# Patient Record
Sex: Male | Born: 1950 | Race: White | Hispanic: No | Marital: Married | Smoking: Never smoker
Health system: Southern US, Community
[De-identification: ages and names within clinical notes are randomized; demographics above are authoritative.]

## PROBLEM LIST (undated history)

## (undated) DIAGNOSIS — E119 Type 2 diabetes mellitus without complications: Secondary | ICD-10-CM

## (undated) DIAGNOSIS — I639 Cerebral infarction, unspecified: Secondary | ICD-10-CM

## (undated) DIAGNOSIS — G43909 Migraine, unspecified, not intractable, without status migrainosus: Secondary | ICD-10-CM

## (undated) HISTORY — DX: Cerebral infarction, unspecified: I63.9

## (undated) HISTORY — PX: BACK SURGERY: SHX140

---

## 1998-02-21 ENCOUNTER — Ambulatory Visit (HOSPITAL_COMMUNITY): Admission: RE | Admit: 1998-02-21 | Discharge: 1998-02-21 | Payer: Self-pay | Admitting: Internal Medicine

## 1999-06-11 ENCOUNTER — Other Ambulatory Visit: Admission: RE | Admit: 1999-06-11 | Discharge: 1999-06-11 | Payer: Self-pay | Admitting: Internal Medicine

## 2002-11-28 ENCOUNTER — Emergency Department (HOSPITAL_COMMUNITY): Admission: EM | Admit: 2002-11-28 | Discharge: 2002-11-29 | Payer: Self-pay | Admitting: Emergency Medicine

## 2003-02-08 ENCOUNTER — Ambulatory Visit (HOSPITAL_BASED_OUTPATIENT_CLINIC_OR_DEPARTMENT_OTHER): Admission: RE | Admit: 2003-02-08 | Discharge: 2003-02-08 | Payer: Self-pay | Admitting: Surgery

## 2006-02-01 ENCOUNTER — Ambulatory Visit (HOSPITAL_COMMUNITY): Admission: RE | Admit: 2006-02-01 | Discharge: 2006-02-01 | Payer: Self-pay | Admitting: Gastroenterology

## 2006-02-02 ENCOUNTER — Encounter: Admission: RE | Admit: 2006-02-02 | Discharge: 2006-02-02 | Payer: Self-pay | Admitting: Gastroenterology

## 2006-02-10 ENCOUNTER — Ambulatory Visit (HOSPITAL_COMMUNITY): Admission: RE | Admit: 2006-02-10 | Discharge: 2006-02-10 | Payer: Self-pay | Admitting: Surgery

## 2006-02-10 ENCOUNTER — Encounter (INDEPENDENT_AMBULATORY_CARE_PROVIDER_SITE_OTHER): Payer: Self-pay | Admitting: *Deleted

## 2006-04-08 ENCOUNTER — Encounter (INDEPENDENT_AMBULATORY_CARE_PROVIDER_SITE_OTHER): Payer: Self-pay | Admitting: *Deleted

## 2006-04-08 ENCOUNTER — Ambulatory Visit (HOSPITAL_COMMUNITY): Admission: RE | Admit: 2006-04-08 | Discharge: 2006-04-08 | Payer: Self-pay | Admitting: Gastroenterology

## 2008-11-12 ENCOUNTER — Encounter: Payer: Self-pay | Admitting: Emergency Medicine

## 2008-11-12 ENCOUNTER — Ambulatory Visit: Payer: Self-pay | Admitting: Diagnostic Radiology

## 2008-11-12 ENCOUNTER — Inpatient Hospital Stay (HOSPITAL_COMMUNITY): Admission: AD | Admit: 2008-11-12 | Discharge: 2008-11-16 | Payer: Self-pay | Admitting: Internal Medicine

## 2008-11-13 ENCOUNTER — Ambulatory Visit: Payer: Self-pay | Admitting: Vascular Surgery

## 2008-11-13 ENCOUNTER — Encounter (INDEPENDENT_AMBULATORY_CARE_PROVIDER_SITE_OTHER): Payer: Self-pay | Admitting: Internal Medicine

## 2008-11-14 ENCOUNTER — Encounter (INDEPENDENT_AMBULATORY_CARE_PROVIDER_SITE_OTHER): Payer: Self-pay | Admitting: Internal Medicine

## 2009-05-16 ENCOUNTER — Emergency Department (HOSPITAL_BASED_OUTPATIENT_CLINIC_OR_DEPARTMENT_OTHER): Admission: EM | Admit: 2009-05-16 | Discharge: 2009-05-16 | Payer: Self-pay | Admitting: Emergency Medicine

## 2009-05-16 ENCOUNTER — Ambulatory Visit: Payer: Self-pay | Admitting: Diagnostic Radiology

## 2010-09-27 ENCOUNTER — Encounter: Payer: Self-pay | Admitting: Gastroenterology

## 2010-11-11 ENCOUNTER — Emergency Department (HOSPITAL_BASED_OUTPATIENT_CLINIC_OR_DEPARTMENT_OTHER)
Admission: EM | Admit: 2010-11-11 | Discharge: 2010-11-12 | Disposition: A | Discharge status: Home / Self Care | Payer: PRIVATE HEALTH INSURANCE | Attending: Emergency Medicine | Admitting: Emergency Medicine

## 2010-11-11 DIAGNOSIS — T783XXA Angioneurotic edema, initial encounter: Secondary | ICD-10-CM | POA: Insufficient documentation

## 2010-11-11 DIAGNOSIS — E785 Hyperlipidemia, unspecified: Secondary | ICD-10-CM | POA: Insufficient documentation

## 2010-11-11 DIAGNOSIS — X58XXXA Exposure to other specified factors, initial encounter: Secondary | ICD-10-CM | POA: Insufficient documentation

## 2010-11-11 DIAGNOSIS — E119 Type 2 diabetes mellitus without complications: Secondary | ICD-10-CM | POA: Insufficient documentation

## 2010-11-11 DIAGNOSIS — R220 Localized swelling, mass and lump, head: Secondary | ICD-10-CM | POA: Insufficient documentation

## 2010-12-11 LAB — BASIC METABOLIC PANEL
CO2: 33 mEq/L — ABNORMAL HIGH (ref 19–32)
Chloride: 99 mEq/L (ref 96–112)
Creatinine, Ser: 1 mg/dL (ref 0.4–1.5)
GFR calc non Af Amer: >60 mL/min (ref >60)
Glucose, Bld: 133 mg/dL — ABNORMAL HIGH (ref 70–99)

## 2010-12-11 LAB — DIFFERENTIAL
Basophils Absolute: 0.1 10*3/uL (ref 0.0–0.1)
Basophils Relative: 1 % (ref 0–1)
Eosinophils Absolute: 0.4 10*3/uL (ref 0.0–0.7)
Lymphocytes Relative: 19 % (ref 12–46)
Lymphs Abs: 1.5 10*3/uL (ref 0.7–4.0)

## 2010-12-11 LAB — CBC: RDW: 12.1 % (ref 11.5–15.5)

## 2010-12-17 LAB — HEPATIC FUNCTION PANEL
ALT: 17 U/L (ref 0–53)
Albumin: 3.5 g/dL (ref 3.5–5.2)
Alkaline Phosphatase: 50 U/L (ref 39–117)
Indirect Bilirubin: 0.6 mg/dL (ref 0.3–0.9)
Total Protein: 6.5 g/dL (ref 6.0–8.3)

## 2010-12-17 LAB — CBC
HCT: 42.1 % (ref 39.0–52.0)
Hemoglobin: 14 g/dL (ref 13.0–17.0)
MCHC: 33.2 g/dL (ref 30.0–36.0)
MCV: 91.3 fL (ref 78.0–100.0)
Platelets: 261 10*3/uL (ref 150–400)
RBC: 3.85 MIL/uL — ABNORMAL LOW (ref 4.22–5.81)
RDW: 11.4 % — ABNORMAL LOW (ref 11.5–15.5)
WBC: 8.1 10*3/uL (ref 4.0–10.5)

## 2010-12-17 LAB — BASIC METABOLIC PANEL
BUN: 13 mg/dL (ref 6–23)
CO2: 22 mEq/L (ref 19–32)
Calcium: 9.8 mg/dL (ref 8.4–10.5)
Glucose, Bld: 151 mg/dL — ABNORMAL HIGH (ref 70–99)
Potassium: 4 mEq/L (ref 3.5–5.1)
Sodium: 140 mEq/L (ref 135–145)

## 2010-12-17 LAB — LIPID PANEL
Cholesterol: 125 mg/dL (ref 0–200)
HDL: 33 mg/dL — ABNORMAL LOW (ref >39)
LDL Cholesterol: 69 mg/dL (ref 0–99)
Total CHOL/HDL Ratio: 3.8 RATIO
Triglycerides: 113 mg/dL (ref <150)
VLDL: 23 mg/dL (ref 0–40)

## 2010-12-17 LAB — GLUCOSE, CAPILLARY
Glucose-Capillary: 110 mg/dL — ABNORMAL HIGH (ref 70–99)
Glucose-Capillary: 113 mg/dL — ABNORMAL HIGH (ref 70–99)
Glucose-Capillary: 121 mg/dL — ABNORMAL HIGH (ref 70–99)
Glucose-Capillary: 127 mg/dL — ABNORMAL HIGH (ref 70–99)
Glucose-Capillary: 128 mg/dL — ABNORMAL HIGH (ref 70–99)
Glucose-Capillary: 135 mg/dL — ABNORMAL HIGH (ref 70–99)
Glucose-Capillary: 170 mg/dL — ABNORMAL HIGH (ref 70–99)
Glucose-Capillary: 174 mg/dL — ABNORMAL HIGH (ref 70–99)
Glucose-Capillary: 186 mg/dL — ABNORMAL HIGH (ref 70–99)
Glucose-Capillary: 192 mg/dL — ABNORMAL HIGH (ref 70–99)
Glucose-Capillary: 88 mg/dL (ref 70–99)

## 2010-12-17 LAB — URINE CULTURE
Colony Count: NO GROWTH
Culture: NO GROWTH

## 2010-12-17 LAB — URINALYSIS, ROUTINE W REFLEX MICROSCOPIC
Bilirubin Urine: NEGATIVE
Hgb urine dipstick: NEGATIVE
Nitrite: NEGATIVE
Protein, ur: NEGATIVE mg/dL
Specific Gravity, Urine: 1.03 (ref 1.005–1.030)
Urobilinogen, UA: 0.2 mg/dL (ref 0.0–1.0)

## 2010-12-17 LAB — COMPREHENSIVE METABOLIC PANEL
ALT: 17 U/L (ref 0–53)
AST: 16 U/L (ref 0–37)
Albumin: 3.3 g/dL — ABNORMAL LOW (ref 3.5–5.2)
Alkaline Phosphatase: 44 U/L (ref 39–117)
CO2: 24 mEq/L (ref 19–32)
Chloride: 107 mEq/L (ref 96–112)
GFR calc Af Amer: >60 mL/min (ref >60)
GFR calc non Af Amer: >60 mL/min (ref >60)
Potassium: 4.6 mEq/L (ref 3.5–5.1)
Total Bilirubin: 0.6 mg/dL (ref 0.3–1.2)

## 2010-12-17 LAB — DIFFERENTIAL
Basophils Absolute: 0 10*3/uL (ref 0.0–0.1)
Basophils Relative: 0 % (ref 0–1)
Eosinophils Relative: 2 % (ref 0–5)
Monocytes Absolute: 0.4 10*3/uL (ref 0.1–1.0)

## 2010-12-17 LAB — VITAMIN B12: Vitamin B-12: 251 pg/mL (ref 211–911)

## 2010-12-17 LAB — CARDIAC PANEL(CRET KIN+CKTOT+MB+TROPI)
CK, MB: 0.8 ng/mL (ref 0.3–4.0)
Relative Index: INVALID (ref 0.0–2.5)
Relative Index: INVALID (ref 0.0–2.5)
Total CK: 40 U/L (ref 7–232)
Troponin I: <0.01 ng/mL (ref 0.00–0.06)
Troponin I: <0.01 ng/mL (ref 0.00–0.06)

## 2010-12-17 LAB — URINE MICROSCOPIC-ADD ON

## 2010-12-17 LAB — PROTIME-INR
INR: 1 (ref 0.00–1.49)
Prothrombin Time: 13.7 seconds (ref 11.6–15.2)

## 2010-12-17 LAB — HEMOGLOBIN A1C
Hgb A1c MFr Bld: 6.2 % — ABNORMAL HIGH (ref 4.6–6.1)
Mean Plasma Glucose: 131 mg/dL

## 2010-12-17 LAB — LIPASE, BLOOD: Lipase: 36 U/L (ref 11–59)

## 2010-12-17 LAB — B. BURGDORFI ANTIBODIES: B burgdorferi Ab IgG+IgM: 0.18 {ISR}

## 2010-12-17 LAB — APTT: aPTT: 30 seconds (ref 24–37)

## 2011-01-19 NOTE — Consult Note (Signed)
NAME:  [name], [name] NO.:  [id number]   MEDICAL RECORD NO.:  [id number]          PATIENT TYPE:  INP   LOCATION:  4730                         FACILITY:  [facility]   PHYSICIAN:  Melvyn Novas, M.D.  DATE OF BIRTH:  [birth year]   DATE OF CONSULTATION:  DATE OF DISCHARGE:                                 CONSULTATION   CHIEF COMPLAINT:  Blurred vision.   HISTORY OF PRESENT ILLNESS:  This is a pleasant 60 year old white male  admitted on November 12, 2008, for left-sided headache, left eye blurred  vision, left facial numbness and left extremity weakness.  Symptoms started approximately about a weak and half ago, became  progressively worse.  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    He noticed that when he was looking down at his paperwork, the words  looked very blurry to him.  He also noted when he looked across the  room , the lines that were across the wall did not seem to meet -  describes changes as if the left  vision field  seemed to be above  the  right portion, diplopia worsened with gaze to the left. he stated after  questioning him, that he felt he could no longer appreciate the colour  red , it became brown and dull.  .  As the day progressed, he noticed that his vision did not improve,  but he did note that if he closed his right eye, he had blurred vision,  no longer diplopia.  If he closed his left eye and opened his right eye,  the visual field remained clear.  In fact he finished his day at work  closing his left eye.  At the same time, he also described headache that  was throbbing in nature on the left temporal region behind his eye, did  not migrate down to his neck, no rigor , no nausea or fever. Marland Kitchen  He does have a history of migraine headaches; however, he states this  episode does not feel like his usual migraine headaches as his usual  migraine headaches cause numbness and tingling throughout his  left side  of his body and his left face.  He did not have any numbness, tingling  or paresthesia of his left face, nor did he have any on the left aspect  of his body.  The only similarity to his normal migraine headache is that he did have  scotoma, a flashing light out of his left eye.  His headache has dissipated to a mild headache over his left periorbital  region, yet his blurred vision did not improve (  nor has it progressed  at this time).  The patient had a full workup for CVA initiated by his hospitalist  physician, Dr. Meyer Russel, which includes a 2-D which showed left  ventricular systolic function to the normal, left ventricular ejection  fraction to be greater than or equal to 60%.  No ventricular wall abnormalities, however, he did have possible PFO and  recommendation was to acquire TEE.  Carotid  Doppler showed no ICA  stenosis.    MRI of the head showed no acute infarct, positive for white matter  changes bilaterally, some periventricular in nature, No enhancement of  the optic nerve or orbit.  Marland Kitchen  However, this was most likely due to small vessel disease.  MRA of the  head showed no evidence of stenosis.   PAST MEDICAL HISTORY:  Positive for hypertension, diabetes with  peripheral neuropathy, hypercholesterolemia, migraine headaches, GERD,  external and internal hemorrhoids and right colon polyps, diagnosed  March 2007 along with prostatitis.   MEDICATIONS:  1. Metformin 500 mg t.i.d.  2. Aciphex 20 mg daily.  3. NPH 14-16 units at bedtime.  4. Singulair 10 mg daily.  5. Lisinopril 20 mg daily.  6. Aspirin 81 mg daily.  7. Neurontin 200-300 mg at bedtime.   ALLERGIES:  CODEINE, which causes a rash.   SOCIAL HISTORY:  He does not smoke, drink or do illicit drugs.   REVIEW OF SYSTEMS:  Negative with the exception of above.   PHYSICAL EXAMINATION:  VITAL SIGNS:  The patient's blood pressure on  November 13, 2008, was 93/61; on November 14, 2008, was 94/58, and November 15, 2008, was 105/69; pulse 73, respiratory rate 18, and temperature 98.3.  PULMONARY:  Clear to auscultation bilaterally.  No rhonchi or wheezing.  CARDIOVASCULAR:  S1 and S2.  Regular rate and rhythm  NECK:  Negative for bruits and supple.  NEUROLOGIC:  Mental status to be alert and oriented x3.  Carrying out 2  and 3 step commands without any difficulty. Fluent speech and  comprehension. no apraxia.  Cranial nerves, pupils are equal, round, reactive, and accommodating to  light.  Conjugate gaze.  Extraocular muscles are intact.  Visual field  was decreased over when the patient covered his right eye as he had  blurred vision throughout, all peripheral fields and central fields;  however, when he closed his left eye, vision was within normal limits.  The patient had no skotomata.  The patient also admitted that the colour  red was less colorful in the vision of his left eye.  His face was symmetrical.  Tongue is midline.  Uvula midline.  Sensation in V1-V3 were full bilaterally.  Shoulder shrug head turn was  full.   Coordination, finger-to-nose was within normal limits with his right  hand, however, with his right hand as I moved my finger across to the  right aspect of his body, it was notable that the patient showed  past-  pointing and dysmetria , he missed the finger by 2-3 inches at arms  length on the right. .  Heel-to-shin was within normal limits and fine  finger movements were within normal limits.  Motor strength was 5/5 globally without tremor, no increased tone.    No abnormal muscle movements or fasciculations.  The patient's gait  was wide based and he did show truncal ataxia tending to veer to the  right.  Deep tendon reflexes were 2+ throughout with downgoing towards  bilaterally.  The patient did not have a drift to the upper and lower extremities  bilaterally.  Sensation was full to pinprick, light touch, and vibration throughout.  He had positive stereognosis  and graphesthesia bilaterally.   LABORATORY STUDIES:  UA was negative.  Sodium was 139, potassium 4.6,  chloride was 107, CO2 was 24, BUN 14, creatinine 0.96, glucose was 162.  White blood cell count 8.1, platelets 261, hemoglobin and hematocrit  12.3 and  35.2.  HbA1c was 6.2.  TSH within normal limits.  Sed rate was  within normal limits.   IMAGING:  As stated above.   ASSESSMENT AT THIS TIME:  This is a pleasant 60 year old male with  gradual onset of left ocular blurring of vision, left-sided paresthesia  and headache.  This could be a brainstem CVA but MRI was negative and there were no  bulbar signs.  Differential includes optic neuritis of ischaemic/ diabetic origin and /  or demyelinating disease. ; however, vasculitis cannot be rule out.     Dr. Vickey Huger recommended  obtaining ANCA/ANA, C-reactive protein, Lyme  disease titer , and protein C and S levels.  Due to his age and gender, he is not likely to have MS; however, if labs  show to be within normal limits, we would recommend LP for oligoclonal  bands.    After blood tests are taken, we will start the patient on prednisone  IV 500 mg with Solu-Medrol tomorrow for a period of 3 days, than oral  taper. .  We will continue that through Sunday and Monday with [facility].    We would like to see the patient follow up in the office with Dr.  Vickey Huger in 1 week with a VEP study to be done in the office and  possibly LP at [facility].  .  Steroids and the possible  complication of diabetes have been  discussed with this patient and he understands.  We will see the patient in 1-2 weeks, prescriptions were written.     ______________________________  Felicie Morn, PA-C      Melvyn Novas, M.D.  Electronically Signed    DS/MEDQ  D:  11/15/2008  T:  11/16/2008  Job:  [id number]   cc:   Melvyn Novas, M.D.

## 2011-01-19 NOTE — Discharge Summary (Signed)
NAMEMarland Kitchen  Sean, [name] NO.:  [id number]   MEDICAL RECORD NO.:  [id number]          PATIENT TYPE:  INP   LOCATION:  4730                         FACILITY:  [facility]   PHYSICIAN:  Charlestine Massed, MDDATE OF BIRTH:  Nov 22, 1950   DATE OF ADMISSION:  11/12/2008  DATE OF DISCHARGE:  11/16/2008                               DISCHARGE SUMMARY   PRIMARY CARE PHYSICIAN:  Silas Sacramento in [place], [place].   NEUROLOGY:  Melvyn Novas, M.D.   REASON FOR ADMISSION:  Left-sided headache with left blurry vision and  left facial numbness and left-sided weakness.   DISCHARGE DIAGNOSES:  1. Migraine headache history with recurrent flare.  2. Possible multiple sclerosis with decreased vision in left eye.  3. Diabetes mellitus type 2 controlled.  4. Dyslipidemia with a low HDL.  5. Gastroesophageal reflux disease.  6. History of external hemorrhoids.  7. Asthma.   DISCHARGE MEDICATIONS:  1. Solu-Medrol 500 mg IV daily for 2 more days (prednisone to be      started after solumedrol is stopped)  2. Prednisone prescription has been given by Dr. Vickey Huger to be      started on the day after Solu-Medrol is stopped.  It is a 10 mg      t.i.d. taper to 10 mg tablets 3 on day 1 and 2 on day 2 and 1 on      day 3 tapered to a stop on day 4. )  3. Niaspan ER 500 mg p.o. q.h.s.  4. Aspirin 325 mg p.o. q.h.s. to be taken together, with Niaspan and      aspirin to be taken together.  5. Lisinopril 10 mg p.o. daily.  6. NovoLog coverage as sensitive scale q.a.c. q.h.s.  7. The previous medications to be continued are metformin 500 mg      b.i.d.  8. NPH insulin 14 units q.h.s. at night.  9. Aciphex 20 mg daily.  10.Singulair 10 mg daily.  11.Gabapentin 200 mg q.h.s.   MEDICATIONS CHANGES:  1. Aspirin changed to 325 mg daily.  2. Lisinopril changed 20 mg to a dose of 10 mg p.o. daily now.   HOSPITAL COURSE:  1. Left-sided headache with blurry vision on the left side with      tingling and nausea.  The patient has prior history of migraine      headaches for many years before which have been investigated      before.  He comes in with complaints of left-sided headache with      some blurry vision in the left eye with tingling sensation of the      left side as well as numbness on the left side of the face.  The      patient stated that this is the usual way he gets migraines.      Initially an MRI and MRA were done and a stroke call was done but      there was no evidence of any stroke on the MRI and MRA.  The      patient was started on aspirin and given pain  medications      initially.  The next day the patient was evaluated by me.  I saw      that the patient had some residual blurry vision which the patient      states stays for 2-3 days.  I contacted ophthalmology that day and      the next day ophthalmology saw the patient and they found that      there is no evidence of any ophthalmologic issue in the eye and a      neurologic consult was requested.  The patient was seen by Dr.      Vickey Huger who thought that the patient has a skewed deviation of the      eye as well as loss of wet vision which she believes could be due      to central optic neuropathy versus multiple sclerosis.  The patient      was put up with a working diagnosis of multiple sclerosis with the      possibility of optic neuritis and started on Solu-Medrol after sets      of vasculitis labs were taken.  His vasculitis labs are still      pending.  He has been started on Solu-Medrol, received one dose      that and he will receive 2 more days of Solu-Medrol at home after      which he will have a prednisone taper for 3 days.  The patient is      expected to see Dr. Vickey Huger in her office on March 17.  Currently      the patient's pain has come down.  He still has some truncal      ataxia.  The patient has been advised to use a cane if needed to      walk to avoid falls and has been  advised clearly not to walk in a      room which has decreased lighting and to follow fall precautions      very carefully.  The patient exhibited understanding and is being      discharged today with [facility] for IV Solu-Medrol      for the next 2 days.  2. Diabetes.  The patient has been explained that his diabetes      mellitus is currently stable until today and he has been explained      that the blood sugars can go up due to Solu-Medrol on board and      prednisone.  He will be started on sliding scale with NovoLog at      home and continue at home with sliding scale.  He will continue      with metformin and Novolin as before.  3. History of dyslipidemia.  He has a low HDL, otherwise the LDL is      only 69 which does not need any statin therapy for now but his low      HDL being the issue would consider him at high risk for metabolic      syndrome in view of his existing diabetes.  So I started him on, he      is already on metformin, to continue the metformin.  We will start      him on Niaspan ER at 500 mg p.o. q.h.s. which should also be taken      along with the aspirin he has been prescribed for his possible  ischemic disease of the brain.  4. Gastroesophageal reflux disease.  He has history of GERD and we      will continue with Aciphex.  5. Questionable hypertension.  He was on 20 mg of lisinopril but his      blood pressures were very low so it has been changed to 10 mg of      lisinopril.  This needs to be reevaluated.  If the blood pressure      still stays low then the patient needs to be changed to 5 mg of      lisinopril.  6. Asthma.  Continue Singulair.  No acute issues now.   FOLLOWUP:  1. To follow up with the primary care physician in 3 weeks.  2. Follow up with Dr. Vickey Huger on March 17.  Phone number has been      given to the patient.   DISCHARGE INSTRUCTIONS:  1. Follow fall precautions clearly.  Use a cane if necessary to avoid       falls.  Not to walk in a room with less      lighting.  The patient has exhibited understanding of all these      things.  2. Keep MD followups as instructed.  The patient has exhibited      understanding about the disease process and he has exhibited full      understanding of followups and medication adherence.      Charlestine Massed, MD  Electronically Signed     UT/MEDQ  D:  11/16/2008  T:  11/16/2008  Job:  [id number]   cc:   Melvyn Novas, M.D.  Silas Sacramento

## 2011-01-19 NOTE — H&P (Signed)
NAME:  [name], [name] NO.:  [id number]   MEDICAL RECORD NO.:  [id number]          PATIENT TYPE:  INP   LOCATION:  4703                         FACILITY:  [facility]   PHYSICIAN:  Elliot Cousin, M.D.    DATE OF BIRTH:  03-Oct-1950   DATE OF ADMISSION:  11/12/2008  DATE OF DISCHARGE:                              HISTORY & PHYSICAL   PRIMARY CARE PHYSICIAN:  Dr. Silas Sacramento in [place], [place].  (The patient is unassigned to [facility]).   CHIEF COMPLAINT:  Left-sided headache, left eye blurred vision, left  facial numbness, left eye light sensitivity, and left-sided weakness.   HISTORY OF PRESENT ILLNESS:  The patient is a 60 year old man with a  past medical history significant for migraine headaches, type 2 diabetes  mellitus, and dyslipidemia.  He presented to the emergency department  this morning with a chief complaint of blurred vision associated with a  left-sided headache, left facial numbness, and left eye light  sensitivity.  His symptoms started approximately 1 week ago.  They have  become progressively worse.  He actually presented to his primary care  physician when his symptoms started.  She evaluated the patient and she  felt that he may have had an inner ear infection associated with a  migraine headache.  She prescribed amoxicillin.  He has been taking  amoxicillin every day including today which had been the last dose.  His  symptoms did not abate.  He does have some ringing in his ears, however,  he denies ear pain.  His headache is mostly left-sided and it has been  constant for the past 2 days.  It has been a throbbing pain.  It has  been an 8/10 in intensity.  He has had blurred vision in both eyes but  primarily in the left eye.  He denies double vision, however he does see  white and yellow spots.  He also feels that his depth perception is off.  He has had intermittent dizziness which is a spinning dizziness and  sometimes he feels as  if he is just simply off balance.  The dizziness  generally occurs when he stands up.  He has had chronic numbness in his  fingers and feet.  However, he developed some numbness on the left side  of his face a few days ago.  He has had no difficulty swallowing.  His  wife says that occasionally he does demonstrate slurred speech.  The  patient also complains of occasional palpitations but no outright chest  pain or shortness of breath.  He has had nausea and vomiting on two or  three reoccurrences over the past week.  He denies associated black  tarry stools, red blood in his stools, and coffee grounds emesis.  He  has also developed diarrhea over the past 4 or 5 days.  He he has been  having loose to watery stools numbering anywhere from 5-15 per day.  The  diarrhea did not start until after the amoxicillin was initiated.  He  denies subjective fever and chills and a stiff neck.   Of  note, the patient does have migraine headaches and the symptoms  associated his migraine usually consist of partial vision in the left  eye, unusual visual hallucinations, jaw biting, decrease in sensation in  his left hand, and a left-sided headache.  Of note, the patient says  that he does feel weaker on his left side, particularly his left leg  which is worse than usual and worse than when he suffers from a migraine  attack.   During the evaluation in the emergency department, the patient was noted  to be hemodynamically stable.  He was afebrile.  His EKG in the  emergency department revealed a heart rate of 88 beats per minute and no  acute abnormalities.  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  His electrolytes  were unremarkable.  His sed rate was marginally elevated at 18.  He is  being admitted for  further evaluation and management.Marland Kitchen   PAST MEDICAL HISTORY:  1. 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  2. Dyslipidemia.  3. Questionable history of hypertension.  The patient says that he is      being treated with lisinopril for kidney protection.  4. Gastroesophageal reflux disease.  5. Migraine headaches.  6. Small external and internal hemorrhoids, sigmoid diverticula, and      proximal right colon polyps per colonoscopy in March 2007 by Dr.      Loreta Ave  7. Biliary dyskinesia status post laparoscopic cholecystectomy in June      2007 by Dr. Corliss Skains.  8. Status post umbilical hernia repair with mesh by Dr. Jamey Ripa in June      2004.  9. History of prostatitis.  10.Asthma.   MEDICATIONS:  1. Metformin 500 mg b.i.d.  2. Aciphex 20 mg daily.  3. Insulin NPH (beef or pork)  14 to 16 units q.h.s.  4. Singulair 10 mg  daily.  5. Lisinopril 20 mg daily.  6. Aspirin 81 mg daily.  7. Gabapentin  200 or 300 mg q.h.s.   ALLERGIES:  The patient has an allergy to CODEINE WHICH CAUSES ITCHING.   SOCIAL HISTORY:  The patient is married.  [place].  He has two sons.  He is employed as a Electrical engineer.  He denies tobacco, alcohol and illicit drug use.   FAMILY HISTORY:  His father died of stomach cancer at 80 years of age.  His mother died of esophageal cancer at 58 years of age.   REVIEW OF SYSTEMS:  The patient's review of systems is indicated above  in the history of present illness.  In addition, the patient has had  some difficulty urinating, urinary hesitancy, and a weak urine stream.  He denies painful urination.   PHYSICAL EXAMINATION:  VITAL SIGNS:  Temperature afebrile.  Pulse 81,  respiratory rate 20, oxygen saturation 92% on room air blood, blood  pressure 102/68, capillary blood glucose 110.  GENERAL:  The patient is a pleasant 60 year old Caucasian man Caucasian man who is  currently sitting up in bed in no acute distress.   HEENT: Head is normocephalic nontraumatic. Bilateral temporal arteries  palpable.  Pupils equal, round, reactive to light. Mild photophobia on  the left.  Extraocular muscles are intact.  Conjunctivae are clear.  Sclerae are white.  Tympanic membranes are clear  bilaterally.  No  evidence of edema, erythema or drainage.  Nasal mucosa is mildly dry.  No sinus tenderness.  Oropharynx reveals good dentition.  Mucous  membranes are mildly dry.  No posterior exudates or erythema.  NECK:  Supple.  No adenopathy, no thyromegaly, no bruit or JVD.  LUNGS:  Clear to auscultation bilaterally.  HEART:  S1-S2 with a soft systolic murmur.  ABDOMEN:  Mildly obese, positive bowel sounds, soft, nontender,  nondistended.  No hepatosplenomegaly, no masses palpated.  GU / RECTAL:  Deferred.  EXTREMITIES:  Pedal pulses are palpable bilaterally.  No pretibial  edema.  No pedal edema.  NEUROLOGIC:  The patient is alert and oriented x3.  Cranial nerves II-  XII are intact except a mild decrease in cold sensation on the left .  No nystagmus detected.  Upon standing, the patient did become mildly  ataxic and off balance.  Romberg was negative.  Cerebellar with finger-  to-nose testing was intact bilaterally.  Left upper extremity strength  is approximately 5- over 5 and left lower extremity strength is  approximately 4+  to 5 minus over 5 in the supine position.  Strength on  the right including the right upper and right lower extremities is 5/5.  Gait:  The patient took a few steps with demonstration of mild ataxia  but no obvious foot drop or foot drag.   ADMISSION LABORATORIES:  EKG and CT scan of the head results are above.  Sed rate 18.  Sodium 140, potassium 4.0, chloride 101, CO2 22, BUN 13,  glucose 151, creatinine 9, calcium 9.8.  WBC 8.2, hemoglobin 14.0,  platelets 311.   ASSESSMENT:  1. Sign symptom complex including left-sided headache, visual changes,      mild left-sided hemiparesis, left facial  paresthesias, and mild      photophobia.  Noted on the CT scan of the patient's head are      findings consistent with extensive atherosclerotic disease.  It is      unclear whether or not the patient has experienced a transient      ischemic attack or a complex migraine headache or an acute stroke.      Given the extensiveness of the atherosclerotic disease, other      diagnoses may need to be entertained including a rheumatological      abnormality or multiple sclerosis.  2. Diarrhea.  More than likely, the diarrhea has been and is secondary      to amoxicillin therapy.  The patient did not experience diarrhea      until after he had been treated with amoxicillin for several days.      His white blood cell count is within normal limits and he is      afebrile.  His abdomen is benign on exam.  Although C-difficile      colitis is a possibility, the patient does not appear to be toxic      or dehydrated at this time.  3. Type 2 diabetes mellitus.  The patient's capillary blood glucose is      modestly elevated.  He is treated chronically with metformin and      NPH insulin.  4. Urinary symptoms.  A urinary tract infection will need to be ruled      out.  The patient does have a remote history of acute prostatitis.   PLAN:  1. The patient was transferred from the [facility] Emergency      Department for further  evaluation and management.  He has been      placed on telemetry.  2. For further evaluation will check an MRI of the brain, MRA of the      brain, and MRA of the neck.  We will also assess the patient      further with carotid Dopplers and a 2-D echocardiogram.  3. Will check cardiac enzymes, hemoglobin A1c, TSH, vitamin B12, and      fasting lipid panel.  4. Will check stool studies to rule out infectious diarrhea.  The      patient will be placed on contact precautions.  We will also check      his amylase, lipase and a urinalysis.  5. Will decrease the dose of lisinopril as  the patient's blood      pressure is on the lower side of normal.  We will also hold      metformin to avoid symptomatic hypoglycemia.  His diabetes will be      treated with a lower dose of NPH and sliding scale NovoLog.  6. As-needed oxycodone will be administered for pain.      Elliot Cousin, M.D.  Electronically Signed     DF/MEDQ  D:  11/12/2008  T:  11/12/2008  Job:  [id number]

## 2011-01-22 NOTE — Op Note (Signed)
NAME:  [name], [name]                        ACCOUNT NO.:  [id number]   MEDICAL RECORD NO.:  [id number]                   PATIENT TYPE:  AMB   LOCATION:  DSC                                  FACILITY:  [facility]   PHYSICIAN:  Currie Paris, M.D.           DATE OF BIRTH:  1950-12-13   DATE OF PROCEDURE:  02/08/2003  DATE OF DISCHARGE:                                 OPERATIVE REPORT   VISIT:  [id number].   OFFICE MEDICAL RECORD NUMBER:  [id number]   PREOPERATIVE DIAGNOSIS:  Umbilical hernia.   POSTOPERATIVE DIAGNOSIS:  Umbilical hernia.   OPERATION:  Repair of umbilical hernia with mesh.   SURGEON:  Currie Paris, M.D.   ANESTHESIA:  MAC.   CLINICAL HISTORY:  The patient is a 60 year old male has a small umbilical  hernia that really protrudes just above the umbilicus and was at least  partially reducible in the office.  He desired having this fixed.   DESCRIPTION OF PROCEDURE:  The patient was seen in the holding area.  He had  no further questions.  The hernia was identified preoperatively.  He was  taken to the operating room, and after satisfactory IV sedation, the  umbilical area was shaved, prepped, and draped.  A combination of 1%  Xylocaine plain and 0.5% Marcaine with epinephrine was mixed equally and  used for local.  I decided to make a vertical incision going actually from  the base of the umbilicus superiorly to over where the bulge was.  Upon  entering the subcutaneous tissue, I basically entered the sac at the same  time.  There was a small amount of omentum protruding out, and this was able  to be reduced.  Using the cautery, I freed up the fascia from the  subcutaneous tissue, and the defect was less than a cm.  There was no other  apparent defect noted.  I used a small piece of Marlex mesh cut into a  square and then fashioned into a cone shape to occlude the hole and closed  the defect with two sutures of 0 Prolene incorporating the mesh with  each  suture and using the mesh basically as a bolster for the sutures.  They tied  down easily.   The subcutaneous tissue was closed with 3-0 Vicryl, and the skin with 4-0  Monocryl subcuticular plus Dermabond.  I then used a mineral oil-soaked  cotton ball to try to keep some form of the umbilicus and a sterile  dressing.   The patient tolerated the procedure well.  There were no operative  complications, and all counts were correct.                                               Currie Paris, M.D.  CJS/MEDQ  D:  02/08/2003  T:  02/09/2003  Job:  [id number]   cc:   [facility]   Lina Sar, M.D. [facility]

## 2011-01-22 NOTE — Op Note (Signed)
NAME:  YOUSOF, [name] NO.:  [id number]   MEDICAL RECORD NO.:  [id number]          PATIENT TYPE:  AMB   LOCATION:  SDS                          FACILITY:  [facility]   PHYSICIAN:  [name], M.D. DATE OF BIRTH:  November 04, 1950   DATE OF PROCEDURE:  02/10/2006  DATE OF DISCHARGE:                                 OPERATIVE REPORT   PREOPERATIVE DIAGNOSIS:  Biliary dyskinesia.   POSTOPERATIVE DIAGNOSIS:  Biliary dyskinesia.   PROCEDURE PERFORMED:  Laparoscopic cholecystectomy with intraoperative  cholangiogram.   SURGEON:  [name], M.D.   ASSISTANT:  Ollen Gross. Carolynne Edouard, M.D.   ANESTHESIA:  General endotracheal.   INDICATIONS:  The patient is a 60 year old diabetic male who presented with  several months of epigastric right upper quadrant and right flank pain.  He  has had a thorough workup by gastroenterology.  This included a CT scan of  the abdomen and pelvis which was completely normal.  An EGD was then  performed on 05/25 which showed was completely normal through to the  duodenum.  Gallbladder ultrasound was unremarkable except for some mild  fatty infiltration of the liver.  HIDA scan was then performed which showed  a gallbladder ejection fraction low at 40%.  MRCP on 02/02/2006 showed no  biliary ductal dilatation, no choledocholithiasis.  The patient presented  for surgical evaluation and we recommended a laparoscopic cholecystectomy.   DESCRIPTION OF PROCEDURE:  The patient is brought to the operating room and  placed supine position on the operating table.  After an adequate level of  general endotracheal anesthesia was obtained, the patient's abdomen was  shaved, prepped with Betadine and draped in sterile fashion.  A time-out was  taken assure proper patient, proper procedure.  The patient has had a  previous umbilical hernia repair with mesh so we chose to go below the mesh.  A small vertical midline incision was made 2 cm below the umbilicus.   The  fascia was opened vertically and the peritoneal cavity was bluntly entered.  A stay suture of 0 Vicryl was placed around the fascial opening and the  Hasson cannula was inserted and secured with stay suture.  Pneumoperitoneum  was obtained by insufflating CO2 maintaining maximal pressure of 15 mmHg.  The patient was rotated in reverse Trendelenburg position rotated slightly  to his left.  The laparoscope was inserted and the interior surface of the  abdominal wall was examined.  No hernias were noted.  We were able to  visualize the previous mesh from the umbilical hernia repair.  Especially  the right upper quadrant there was no evidence of abdominal wall hernia.  There was some deformity to the costal margin.  The patient may have a  dislocated lower rib.   A 10 mm port was placed subxiphoid position.  Two 5 mm ports placed in right  upper quadrant.  The gallbladder was grasped with a clamp and elevated over  the edge of the liver.  The gallbladder did not appear to be inflamed.  There were some adhesions to the gallbladder.  Adhesions were taken down  with cautery and blunt dissection.  The wall of the gallbladder seemed to be  very brittle and tore  easily with normal grasping.  We were able to dissect  around the cystic duct circumferentially.  This was ligated with a clip  distally and a cholangiogram catheter was inserted through a stab incision  into the cystic duct.  It was secured with a clip and cholangiogram was  obtained under fluoroscopy which showed a long tortuous cystic duct with no  obstruction.  There was good flow of contrast into the duodenum as well as  proximally into the two sides of the biliary system.  The cholangiogram  catheter was then removed.  The cystic duct was ligated with three clips and  divided.  The small stump of the cystic duct was sent for pathologic  examination.  A two branches of the cystic artery were then ligated with  clips and cautery  was used to remove the gallbladder from the liver bed.  The gallbladder was placed in EndoCatch sac and removed the umbilical port.  The liver bed was then thoroughly irrigated.  There several oozing areas  which were cauterized for hemostasis.  A small piece of Surgicel was soaked  in 0.25% Marcaine and placed in the gallbladder fossa.  The irrigant was  suctioned out.  We reinspected for hemostasis and the ports were removed  under direct vision as pneumoperitoneum was released.  The pursestring  suture was used to close the umbilical fascia.  An additional 0 Vicryl was  used to complete the fascial closure.  4-0 Monocryl was used to close the  skin in subcuticular fashion.  Steri-Strips and clean dressings were  applied.  The patient was then extubated and brought to recovery in stable  condition.  All sponge, instrument and needle counts correct.      Wilmon Arms. Tsuei, M.D.  Electronically Signed     MKT/MEDQ  D:  02/10/2006  T:  02/10/2006  Job:  [id number]   cc:   Anselmo Rod, M.D.  Fax: [phone]   Jordan Hawks. Elnoria Howard, MD  Fax: [phone]

## 2011-01-22 NOTE — Op Note (Signed)
NAME:  [name], [name] NO.:  [id number]   MEDICAL RECORD NO.:  [id number]          PATIENT TYPE:  AMB   LOCATION:  ENDO                         FACILITY:  [facility]   PHYSICIAN:  Anselmo Rod, M.D.  DATE OF BIRTH:  Mar 28, 1951   DATE OF PROCEDURE:  04/08/2006  DATE OF DISCHARGE:                                 OPERATIVE REPORT   ENDOSCOPIST:  Anselmo Rod, M.D.   PROCEDURE:  Colonoscopy with cold biopsy times one.   INSTRUMENT USED:  Olympus video colonoscope.   INDICATIONS FOR PROCEDURE:  A 60 year old white male undergoing screening  colonoscopy to rule out colonic polyps, masses, etc.   PRE-PROCEDURE PREPARATION:  Informed consent was procured from the patient.  The patient fasted for 4 hours prior to the procedure and was prepped with  32 OsmoPrep the night of and the morning of the procedure.  The risks and  benefits of the procedure, including a 10% miss rate of cancer and polyp,  were discussed with the patient as well.   PREPROCEDURE PHYSICAL:  Patient had stable vital signs.  NECK:  Supple.  CHEST:  Clear to auscultation; S1, S2, regular.  ABDOMEN:  Soft, with normal bowel sounds.   DESCRIPTION OF PROCEDURE:  The patient was placed in the left lateral  decubitus position and sedated with 100 mcg of fentanyl and 8 mg of Versed  in slow incremental doses.  Once the patient was adequately sedated and  maintained on low-flow oxygen and continuous cardiac monitoring, the Olympus  video colonoscope was advanced from the rectum to the cecum with difficulty.  The patient had a significant amount of residual stool in the colon.  Multiple washings were done.  Small external and internal hemorrhoids were  seen on anal inspection and retroflexion in the rectum.  A small sessile  polyp was biopsied from the proximal right colon.  Small lesions could be  missed secondary to the relatively poor prep.  The patient tolerated the  procedure well without  complications.  A few scattered sigmoid diverticula  were seen.   IMPRESSION:  1.  Small internal and external hemorrhoids.  2.  A few scattered sigmoid diverticula.  3.  Small sessile polyp, biopsied in the proximal right colon.  4.  Large amount of residual stool in the colon; small lesions could be      missed.   RECOMMENDATIONS:  1.  Await pathology results.  2.  Repeat colonoscopy, depending on pathology results.  3.  Avoid all nonsteroidals, including aspirin, for the next 2 weeks.  4.  Outpatient followup as need arises in the future.  5.  The importance of a high-fiber diet with adequate fluid intake and      information on diverticulitis have been given to the patient for      education.      Anselmo Rod, M.D.  Electronically Signed     JNM/MEDQ  D:  04/09/2006  T:  04/09/2006  Job:  [id number]   cc:   Teena Irani. Arlyce Dice, M.D.  Wilmon Arms. Tsuei, M.D.

## 2013-05-11 ENCOUNTER — Other Ambulatory Visit: Payer: Self-pay | Admitting: *Deleted

## 2013-05-11 DIAGNOSIS — M545 Low back pain: Secondary | ICD-10-CM

## 2013-05-15 ENCOUNTER — Other Ambulatory Visit: Payer: PRIVATE HEALTH INSURANCE

## 2014-12-09 ENCOUNTER — Other Ambulatory Visit: Payer: Self-pay | Admitting: Orthopedic Surgery

## 2014-12-09 DIAGNOSIS — M5106 Intervertebral disc disorders with myelopathy, lumbar region: Secondary | ICD-10-CM

## 2014-12-21 ENCOUNTER — Ambulatory Visit
Admission: RE | Admit: 2014-12-21 | Discharge: 2014-12-21 | Disposition: A | Discharge status: Home / Self Care | Payer: PRIVATE HEALTH INSURANCE | Source: Ambulatory Visit | Attending: Orthopedic Surgery | Admitting: Orthopedic Surgery

## 2014-12-21 DIAGNOSIS — M5106 Intervertebral disc disorders with myelopathy, lumbar region: Secondary | ICD-10-CM

## 2015-05-05 ENCOUNTER — Ambulatory Visit (HOSPITAL_COMMUNITY)
Admission: RE | Admit: 2015-05-05 | Discharge: 2015-05-05 | Disposition: A | Discharge status: Home / Self Care | Payer: Medicare Other | Source: Ambulatory Visit | Attending: Gastroenterology | Admitting: Gastroenterology

## 2015-05-05 ENCOUNTER — Encounter (HOSPITAL_COMMUNITY)
Admission: RE | Disposition: A | Discharge status: Home / Self Care | Payer: Self-pay | Source: Ambulatory Visit | Attending: Gastroenterology

## 2015-05-05 DIAGNOSIS — D509 Iron deficiency anemia, unspecified: Secondary | ICD-10-CM | POA: Diagnosis not present

## 2015-05-05 HISTORY — PX: GIVENS CAPSULE STUDY: SHX5432

## 2015-05-05 SURGERY — IMAGING PROCEDURE, GI TRACT, INTRALUMINAL, VIA CAPSULE
Anesthesia: LOCAL

## 2015-05-05 SURGICAL SUPPLY — 1 items: TOWEL COTTON PACK 4EA (MISCELLANEOUS) ×4 IMPLANT

## 2015-05-05 NOTE — Progress Notes (Signed)
Late Note: pt had capsule endoscopy this morning at 0730 per Dr. Loreta Ave. Wilder Glade, rn

## 2015-05-06 ENCOUNTER — Encounter (HOSPITAL_COMMUNITY): Payer: Self-pay | Admitting: Gastroenterology

## 2016-11-20 ENCOUNTER — Encounter (HOSPITAL_BASED_OUTPATIENT_CLINIC_OR_DEPARTMENT_OTHER): Payer: Self-pay | Admitting: *Deleted

## 2016-11-20 ENCOUNTER — Emergency Department (HOSPITAL_BASED_OUTPATIENT_CLINIC_OR_DEPARTMENT_OTHER): Payer: Medicare Other

## 2016-11-20 ENCOUNTER — Emergency Department (HOSPITAL_BASED_OUTPATIENT_CLINIC_OR_DEPARTMENT_OTHER)
Admission: EM | Admit: 2016-11-20 | Discharge: 2016-11-21 | Disposition: A | Discharge status: Home / Self Care | Payer: Medicare Other | Attending: Emergency Medicine | Admitting: Emergency Medicine

## 2016-11-20 DIAGNOSIS — R197 Diarrhea, unspecified: Secondary | ICD-10-CM | POA: Insufficient documentation

## 2016-11-20 DIAGNOSIS — R079 Chest pain, unspecified: Secondary | ICD-10-CM | POA: Insufficient documentation

## 2016-11-20 DIAGNOSIS — J0100 Acute maxillary sinusitis, unspecified: Secondary | ICD-10-CM | POA: Insufficient documentation

## 2016-11-20 DIAGNOSIS — R05 Cough: Secondary | ICD-10-CM | POA: Diagnosis present

## 2016-11-20 DIAGNOSIS — R1084 Generalized abdominal pain: Secondary | ICD-10-CM | POA: Insufficient documentation

## 2016-11-20 DIAGNOSIS — E119 Type 2 diabetes mellitus without complications: Secondary | ICD-10-CM | POA: Insufficient documentation

## 2016-11-20 DIAGNOSIS — R112 Nausea with vomiting, unspecified: Secondary | ICD-10-CM

## 2016-11-20 DIAGNOSIS — Z7984 Long term (current) use of oral hypoglycemic drugs: Secondary | ICD-10-CM | POA: Insufficient documentation

## 2016-11-20 HISTORY — DX: Type 2 diabetes mellitus without complications: E11.9

## 2016-11-20 LAB — COMPREHENSIVE METABOLIC PANEL
ALT: 11 U/L — AB (ref 17–63)
AST: 13 U/L — AB (ref 15–41)
Albumin: 4.2 g/dL (ref 3.5–5.0)
Alkaline Phosphatase: 63 U/L (ref 38–126)
Anion gap: 8 (ref 5–15)
BUN: 14 mg/dL (ref 6–20)
CHLORIDE: 101 mmol/L (ref 101–111)
CO2: 27 mmol/L (ref 22–32)
CREATININE: 1.35 mg/dL — AB (ref 0.61–1.24)
Calcium: 9.3 mg/dL (ref 8.9–10.3)
GFR calc Af Amer: >60 mL/min (ref >60)
GFR calc non Af Amer: 53 mL/min — ABNORMAL LOW (ref >60)
Glucose, Bld: 96 mg/dL (ref 65–99)
Potassium: 4.1 mmol/L (ref 3.5–5.1)
SODIUM: 136 mmol/L (ref 135–145)
Total Bilirubin: 0.6 mg/dL (ref 0.3–1.2)
Total Protein: 7.7 g/dL (ref 6.5–8.1)

## 2016-11-20 LAB — CBC WITH DIFFERENTIAL/PLATELET
Basophils Absolute: 0 10*3/uL (ref 0.0–0.1)
Basophils Relative: 1 %
Eosinophils Absolute: 0.3 10*3/uL (ref 0.0–0.7)
Eosinophils Relative: 5 %
HEMATOCRIT: 33.6 % — AB (ref 39.0–52.0)
HEMOGLOBIN: 11 g/dL — AB (ref 13.0–17.0)
LYMPHS ABS: 1.8 10*3/uL (ref 0.7–4.0)
Lymphocytes Relative: 31 %
MCH: 28.9 pg (ref 26.0–34.0)
MCHC: 32.7 g/dL (ref 30.0–36.0)
MCV: 88.2 fL (ref 78.0–100.0)
MONOS PCT: 8 %
Monocytes Absolute: 0.4 10*3/uL (ref 0.1–1.0)
Neutro Abs: 3.2 10*3/uL (ref 1.7–7.7)
Neutrophils Relative %: 55 %
Platelets: 307 10*3/uL (ref 150–400)
RBC: 3.81 MIL/uL — AB (ref 4.22–5.81)
RDW: 15 % (ref 11.5–15.5)
WBC: 5.7 10*3/uL (ref 4.0–10.5)

## 2016-11-20 LAB — ACETAMINOPHEN LEVEL: Acetaminophen (Tylenol), Serum: <10 ug/mL — ABNORMAL LOW (ref 10–30)

## 2016-11-20 MED ORDER — SODIUM CHLORIDE 0.9 % IV BOLUS (SEPSIS)
1000.0000 mL | Freq: Once | INTRAVENOUS | Status: AC
  Administered 2016-11-20: 1000 mL via INTRAVENOUS

## 2016-11-20 NOTE — ED Notes (Signed)
ED Provider at bedside. 

## 2016-11-20 NOTE — ED Notes (Signed)
Pt c/o cough, fever, diarrhea, body aches x couple weeks. Not eating. Vomited last night and this a.m.

## 2016-11-20 NOTE — ED Triage Notes (Signed)
Pt reports cough, abdominal discomfort, congestion x a couple weeks.

## 2016-11-20 NOTE — ED Notes (Signed)
Patient transported to X-ray 

## 2016-11-20 NOTE — ED Provider Notes (Signed)
[facility] Note   CSN: [id number] Arrival date & time: 11/20/16  1948  By signing my name below, I, [name], attest that this documentation has been prepared under the direction and in the presence of non-physician practitioner, Terance Hart, PA-C. Electronically Signed: Modena [name], Scribe. 11/20/2016. 10:24 PM.  History   Chief Complaint Chief Complaint  Patient presents with  . URI   The history is provided by the patient. No language interpreter was used.   HPI Comments: [name] is a 66 y.o. male with a PMHx of insulin-dependent DM who presents to the Emergency Department complaining of intermittent moderate cough that started 3 weeks ago. He was seen by his PCP for respiratory illness on 10/18/16. He states he began with a suspected sinus infection. He took Mucinex, Alka-seltzer Cold Plus, and Tylenol with minimal relief. He describes the cough as intermittently productive of green sputum. He reports associated decreased appetite, subjective fever, nasal congestion, post-nasal drip, left-sided sore throat, chest pain, diarrhea (onset 5 days ago), vomiting, intermittent abdominal pain (constant over the past 3 days), generalized myalgias, and headache. Pt's temperature in the ED today was 98.5. He admits to a hx of hernia repair with no other prior abdominal surgeries. He denies any ear pain or other complaints.     PCP: Sean Inch, MD  Past Medical History:  Diagnosis Date  . Diabetes mellitus without complication (HCC)     There are no active problems to display for this patient.   Past Surgical History:  Procedure Laterality Date  . BACK SURGERY    . GIVENS CAPSULE STUDY N/A 05/05/2015   Procedure: GIVENS CAPSULE STUDY;  Surgeon: Charna Elizabeth, MD;  Location: [facility] ENDOSCOPY;  Service: Endoscopy;  Laterality: N/A;       Home Medications    Prior to Admission medications   Medication Sig Start Date End Date Taking? Authorizing  Provider  metFORMIN (GLUCOPHAGE) 1000 MG tablet Take 1,000 mg by mouth 2 (two) times daily with a meal.    Historical Provider, MD    Family History History reviewed. No pertinent family history.  Social History Social History  Substance Use Topics  . Smoking status: Never Smoker  . Smokeless tobacco: Never Used  . Alcohol use No     Allergies   Codeine   Review of Systems Review of Systems  Constitutional: Positive for appetite change and fever (Subjective).  HENT: Positive for congestion (Nasal congestion), rhinorrhea and sore throat (Left-sided). Negative for ear pain.   Cardiovascular: Positive for chest pain.  Gastrointestinal: Positive for abdominal pain, diarrhea and vomiting.  Musculoskeletal: Positive for myalgias (Generalized).  Neurological: Positive for headaches.  All other systems reviewed and are negative.    Physical Exam Updated Vital Signs BP (!) 144/86 (BP Location: Left Arm)   Pulse 74   Temp 98.5 F (36.9 C) (Oral)   Resp 18   Ht 5\' 7"  (1.702 m)   Wt 160 lb (72.6 kg)   SpO2 97%   BMI 25.06 kg/m   Physical Exam  Constitutional: He is oriented to person, place, and time. He appears well-developed and well-nourished. He is cooperative. No distress.  Appears fatigued  HENT:  Head: Normocephalic and atraumatic.  Right Ear: Hearing, tympanic membrane, external ear and ear canal normal.  Left Ear: Hearing, tympanic membrane, external ear and ear canal normal.  Nose: Rhinorrhea present. Right sinus exhibits maxillary sinus tenderness and frontal sinus tenderness. Left sinus exhibits maxillary sinus tenderness and frontal sinus  tenderness.  Mouth/Throat: Uvula is midline, oropharynx is clear and moist and mucous membranes are normal.  Eyes: Conjunctivae are normal. Pupils are equal, round, and reactive to light. Right eye exhibits no discharge. Left eye exhibits no discharge. No scleral icterus.  Neck: Normal range of motion. Neck supple.  Tender  left-sided lymphadenopathy.   Cardiovascular: Normal rate and regular rhythm.  Exam reveals no gallop and no friction rub.   No murmur heard. Pulmonary/Chest: Effort normal and breath sounds normal. No respiratory distress. He has no wheezes. He has no rales. He exhibits no tenderness.  Abdominal: Soft. Bowel sounds are normal. He exhibits no distension and no mass. There is tenderness (diffuse, mild). There is no rebound and no guarding. No hernia.  Musculoskeletal: Normal range of motion.  Lymphadenopathy:    He has cervical adenopathy.  Neurological: He is alert and oriented to person, place, and time.  Skin: Skin is warm and dry.  Psychiatric: He has a normal mood and affect. His behavior is normal.  Nursing note and vitals reviewed.    ED Treatments / Results  DIAGNOSTIC STUDIES: Oxygen Saturation is 97% on RA, normal by my interpretation.    COORDINATION OF CARE: 10:28 PM- Pt advised of plan for treatment and pt agrees.  Labs (all labs ordered are listed, but only abnormal results are displayed) Labs Reviewed  COMPREHENSIVE METABOLIC PANEL - Abnormal; Notable for the following:       Result Value   Creatinine, Ser 1.35 (*)    AST 13 (*)    ALT 11 (*)    GFR calc non Af Amer 53 (*)    All other components within normal limits  CBC WITH DIFFERENTIAL/PLATELET - Abnormal; Notable for the following:    RBC 3.81 (*)    Hemoglobin 11.0 (*)    HCT 33.6 (*)    All other components within normal limits  ACETAMINOPHEN LEVEL - Abnormal; Notable for the following:    Acetaminophen (Tylenol), Serum <10 (*)    All other components within normal limits    EKG  EKG Interpretation None       Radiology Dg Chest 2 View  Result Date: 11/20/2016 CLINICAL DATA:  Cough, sneezing, and earache with intermittent fever for 2 weeks. Nonsmoker. History of diabetes. EXAM: CHEST  2 VIEW COMPARISON:  02/08/2006 FINDINGS: Normal heart size and pulmonary vascularity. No focal airspace disease  or consolidation in the lungs. No blunting of costophrenic angles. No pneumothorax. Mediastinal contours appear intact. Degenerative changes in the spine. Surgical clips in the right upper quadrant. IMPRESSION: No active cardiopulmonary disease. Electronically Signed   By: Burman NievesWilliam  Stevens M.D.   On: 11/20/2016 23:32    Procedures Procedures (including critical care time)  Medications Ordered in ED Medications  sodium chloride 0.9 % bolus 1,000 mL (0 mLs Intravenous Stopped 11/21/16 0037)     Initial Impression / Assessment and Plan / ED Course  I have reviewed the triage vital signs and the nursing notes.  Pertinent labs & imaging results that were available during my care of the patient were reviewed by me and considered in my medical decision making (see chart for details).  66 year old male presents with multiple symptoms. His work up is overall unremarkable. Vitals are normal. CBC remarkable for mild anemia. CMP remarkable for slight elevation in SCr (1.35) which is close to his baseline. CXR negative. Will treat for sinusitis based on length of symptoms and advised follow up with PCP. Return precautions given.  Final  Clinical Impressions(s) / ED Diagnoses   Final diagnoses:  Acute maxillary sinusitis, recurrence not specified  Nausea vomiting and diarrhea    New Prescriptions Discharge Medication List as of 11/21/2016 12:27 AM    START taking these medications   Details  amoxicillin-clavulanate (AUGMENTIN) 875-125 MG tablet Take 1 tablet by mouth 2 (two) times daily., Starting Sun 11/21/2016, Print    ondansetron (ZOFRAN ODT) 4 MG disintegrating tablet Take 1 tablet (4 mg total) by mouth every 8 (eight) hours as needed for nausea or vomiting., Starting Sun 11/21/2016, Print       I personally performed the services described in this documentation, which was scribed in my presence. The recorded information has been reviewed and is accurate.     Bethel Born,  PA-C 11/21/16 2004    [address] PA-C 11/21/16 2004    Tilden Fossa, MD 11/25/16 [phone]

## 2016-11-21 MED ORDER — ONDANSETRON 4 MG PO TBDP: 4.0000 mg | ORAL_TABLET | Freq: Three times a day (TID) | ORAL | 0 refills | Status: AC | PRN

## 2016-11-21 MED ORDER — AMOXICILLIN-POT CLAVULANATE 875-125 MG PO TABS
1.0000 | ORAL_TABLET | Freq: Two times a day (BID) | ORAL | 0 refills | Status: DC
Start: 2016-11-21 — End: 2022-11-29

## 2016-11-21 NOTE — Discharge Instructions (Signed)
Take antibiotic for the next week. Take with food Continue Mucinex and nasal spray Take zofran as needed for nausea and vomiting Follow up with your family doctor

## 2017-09-06 IMAGING — DX DG CHEST 2V
2 series · 2 of 2 positions shown · non-contrast
Comparison: 02/08/2006

CLINICAL DATA: Cough, sneezing, and earache with intermittent fever
for 2 weeks. Nonsmoker. History of diabetes.

EXAM:
CHEST  2 VIEW

[chest pa]
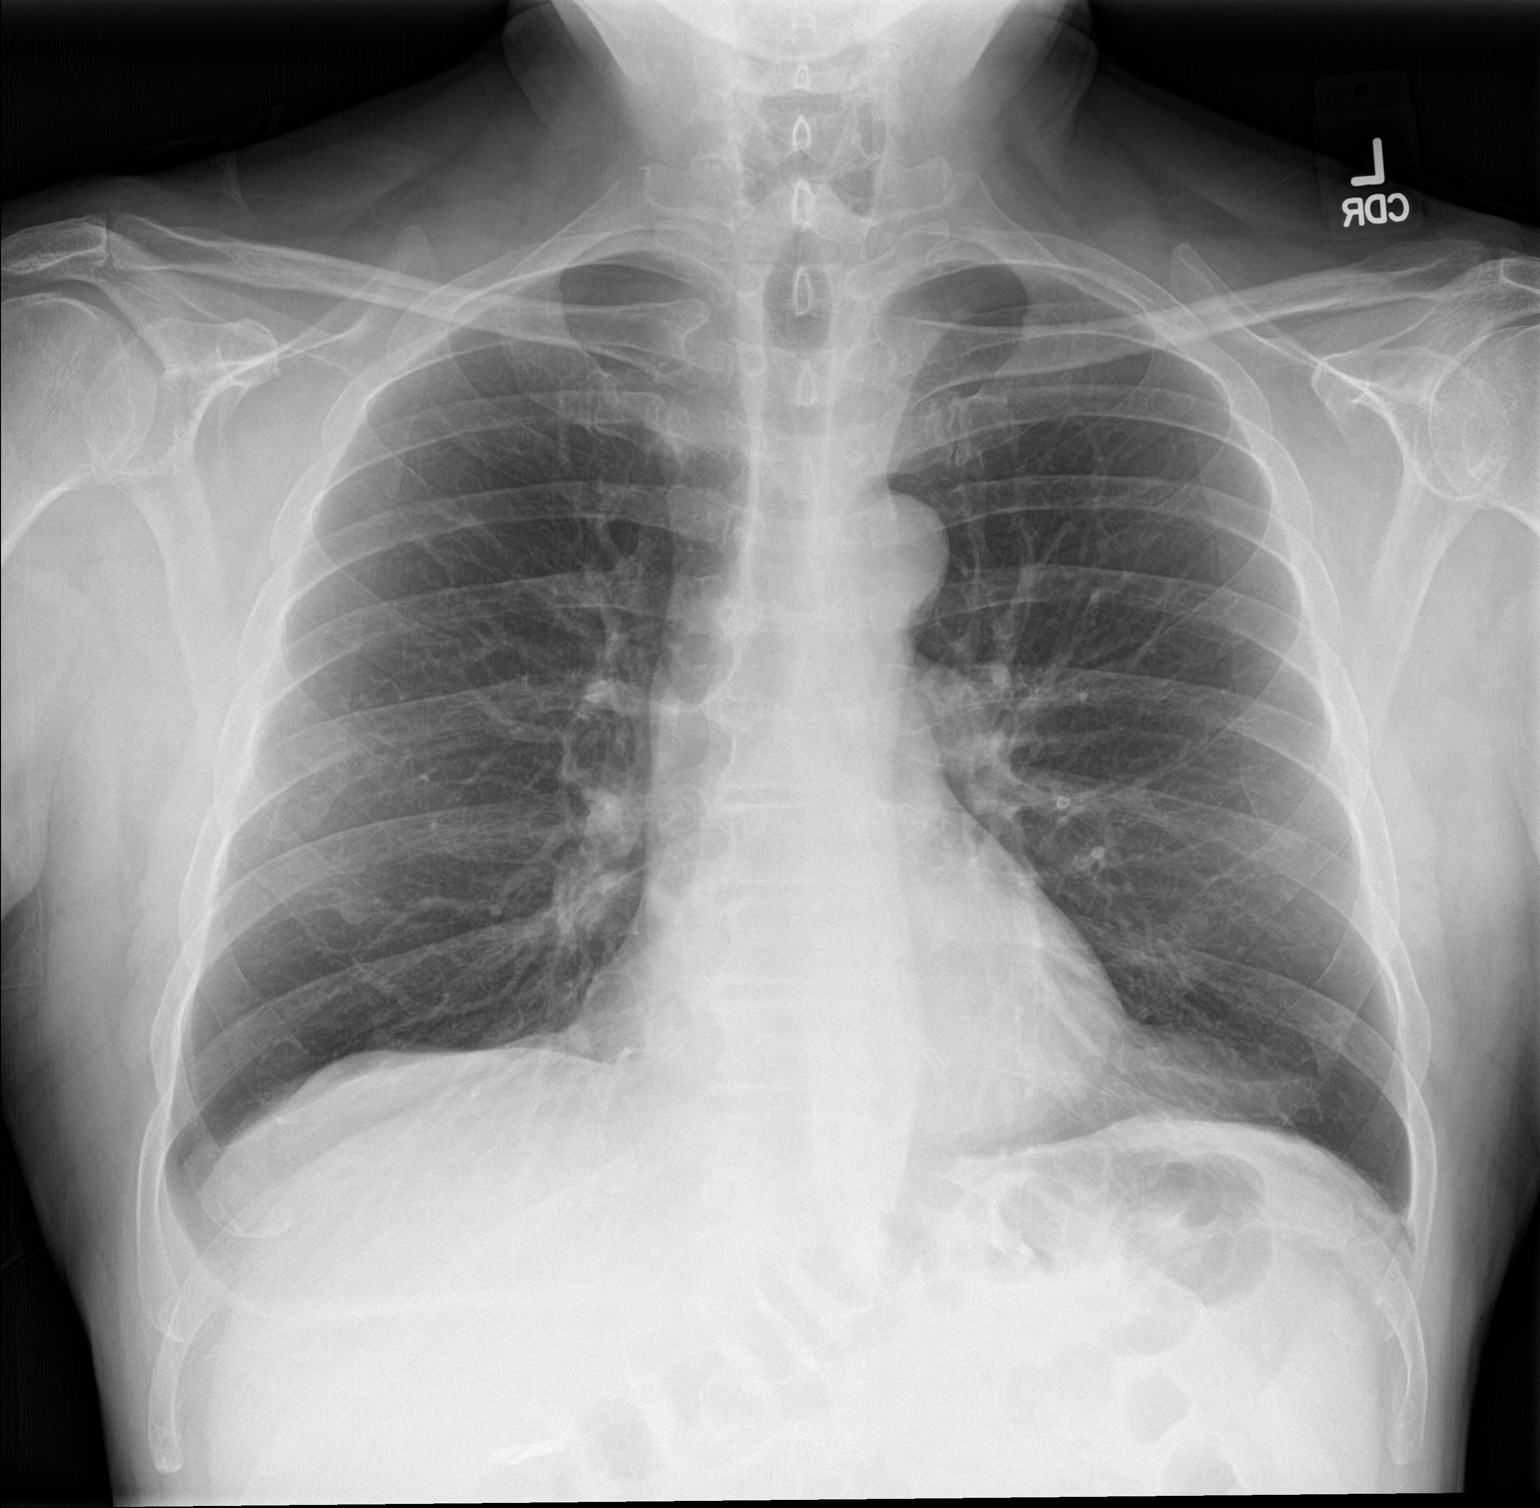

[chest lat]
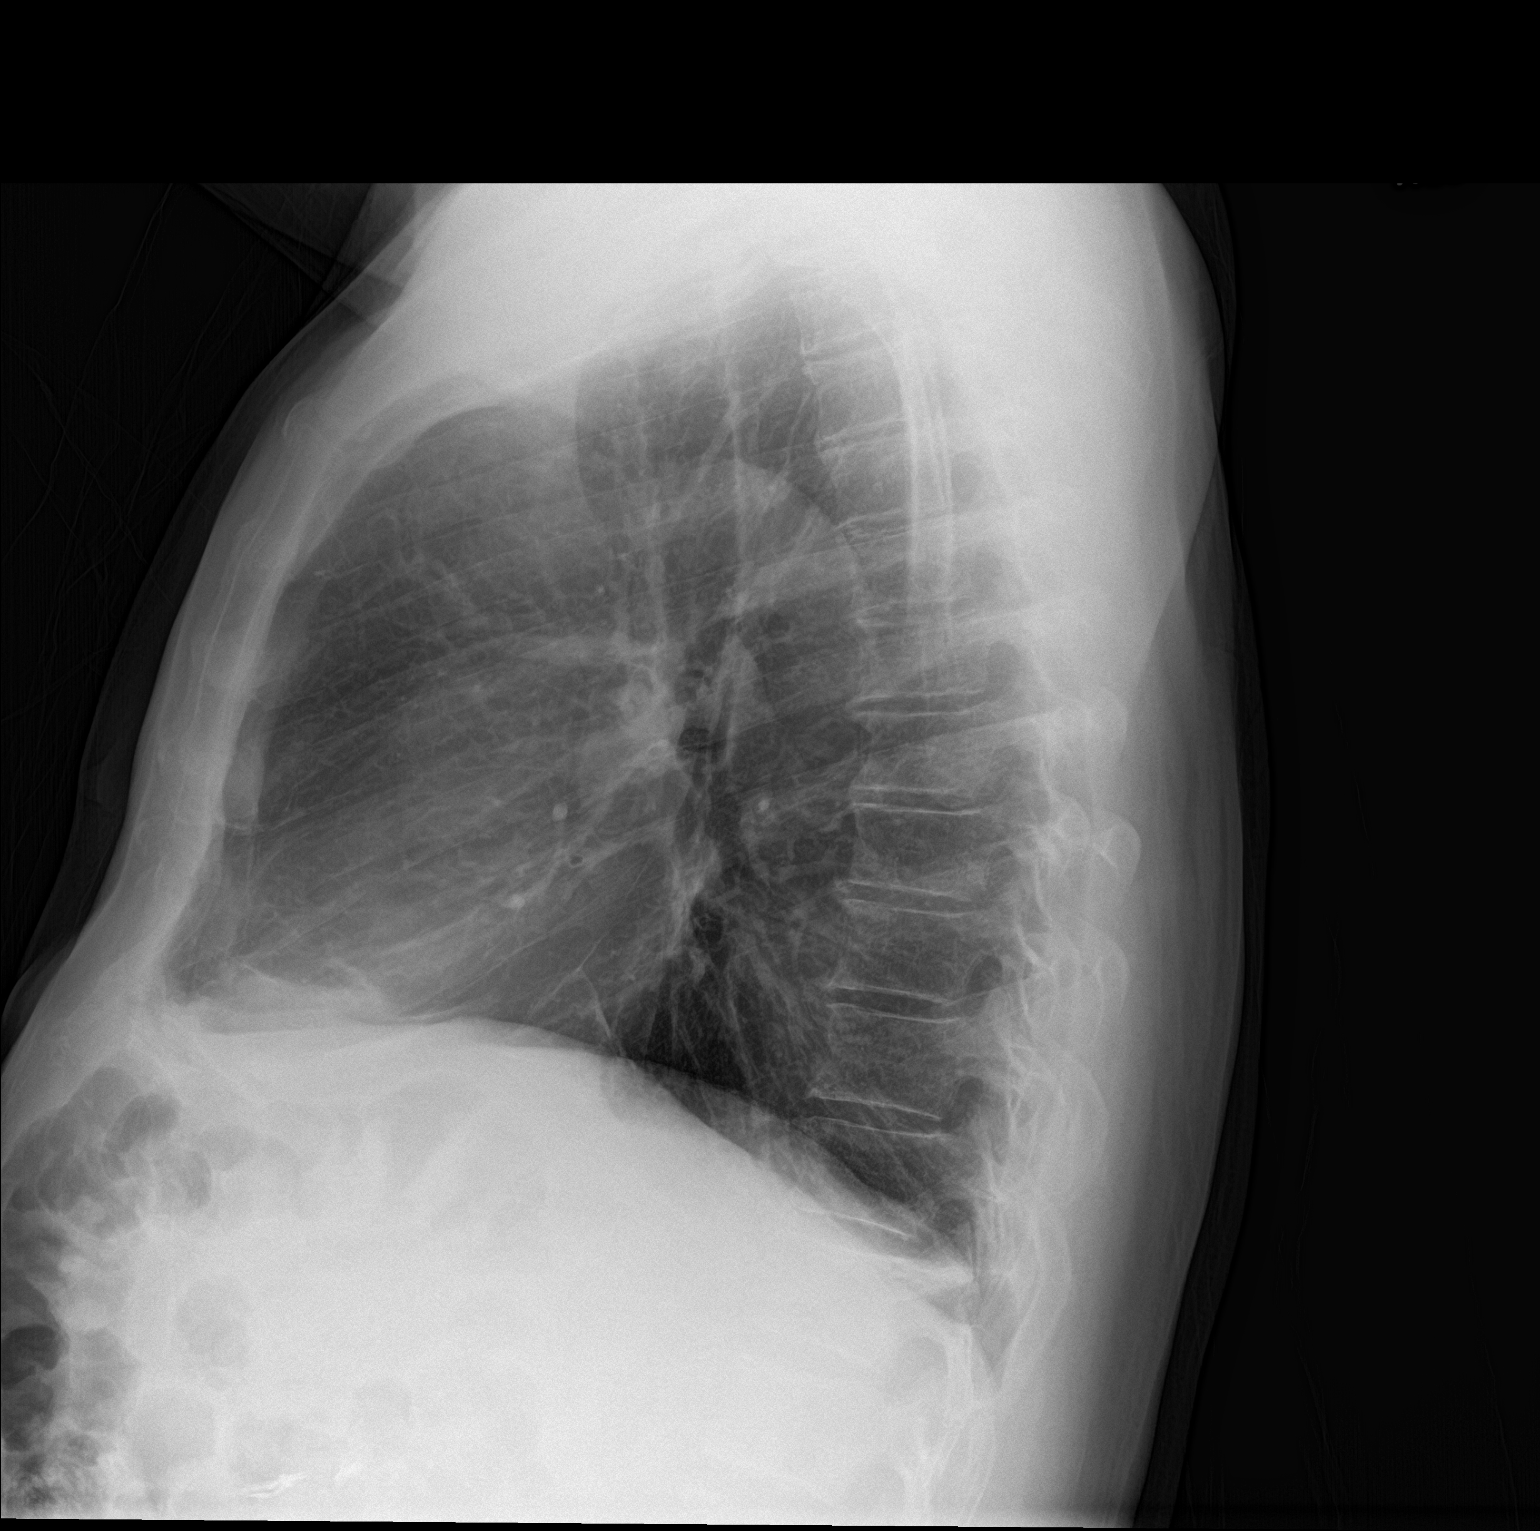

[2 of 2 positions shown; findings below may reference images not displayed]

FINDINGS: Normal heart size and pulmonary vascularity. No focal airspace
disease or consolidation in the lungs. No blunting of costophrenic
angles. No pneumothorax. Mediastinal contours appear intact.
Degenerative changes in the spine. Surgical clips in the right upper
quadrant.
IMPRESSION: No active cardiopulmonary disease.

## 2019-11-22 ENCOUNTER — Ambulatory Visit: Payer: Medicare Other | Attending: Internal Medicine

## 2019-11-22 DIAGNOSIS — Z23 Encounter for immunization: Secondary | ICD-10-CM

## 2019-11-22 NOTE — Progress Notes (Signed)
   Covid-19 Vaccination Clinic  Name:  [name]    MRN: [id number] DOB: 02-11-1951  11/22/2019  [name] was observed post Covid-19 immunization for 15 minutes without incident. He was provided with Vaccine Information Sheet and instruction to access the V-Safe system.   [name] was instructed to call 911 with any severe reactions post vaccine: Marland Kitchen Difficulty breathing  . Swelling of face and throat  . A fast heartbeat  . A bad rash all over body  . Dizziness and weakness   Immunizations Administered    Name Date Dose VIS Date Route   Pfizer COVID-19 Vaccine 11/22/2019  8:21 AM 0.3 mL 08/17/2019 Intramuscular   Manufacturer: [employer], [employer]   Lot: [id number]   NDC: [address]-4

## 2019-12-17 ENCOUNTER — Ambulatory Visit: Payer: Medicare Other | Attending: Internal Medicine

## 2019-12-17 DIAGNOSIS — Z23 Encounter for immunization: Secondary | ICD-10-CM

## 2019-12-17 NOTE — Progress Notes (Signed)
   Covid-19 Vaccination Clinic  Name:  [name]    MRN: [id number] DOB: 1951-07-23  12/17/2019  [name] was observed post Covid-19 immunization for 15 minutes without incident. He was provided with Vaccine Information Sheet and instruction to access the V-Safe system.   [name] was instructed to call 911 with any severe reactions post vaccine: Marland Kitchen Difficulty breathing  . Swelling of face and throat  . A fast heartbeat  . A bad rash all over body  . Dizziness and weakness   Immunizations Administered    Name Date Dose VIS Date Route   Pfizer COVID-19 Vaccine 12/17/2019  8:14 AM 0.3 mL 08/17/2019 Intramuscular   Manufacturer: [employer], [employer]   Lot: [id number]   NDC: [address]-3

## 2022-11-25 ENCOUNTER — Inpatient Hospital Stay (HOSPITAL_COMMUNITY)
Admission: EM | Admit: 2022-11-25 | Discharge: 2022-11-29 | DRG: 041 | Disposition: A | Discharge status: Home / Self Care | Payer: Medicare Other | Attending: Internal Medicine | Admitting: Internal Medicine

## 2022-11-25 ENCOUNTER — Encounter (HOSPITAL_COMMUNITY): Payer: Self-pay

## 2022-11-25 ENCOUNTER — Emergency Department (HOSPITAL_COMMUNITY): Payer: Medicare Other

## 2022-11-25 ENCOUNTER — Inpatient Hospital Stay (HOSPITAL_COMMUNITY): Payer: Medicare Other

## 2022-11-25 ENCOUNTER — Other Ambulatory Visit: Payer: Self-pay

## 2022-11-25 DIAGNOSIS — I6389 Other cerebral infarction: Secondary | ICD-10-CM | POA: Diagnosis not present

## 2022-11-25 DIAGNOSIS — N1831 Chronic kidney disease, stage 3a: Secondary | ICD-10-CM | POA: Diagnosis present

## 2022-11-25 DIAGNOSIS — R202 Paresthesia of skin: Secondary | ICD-10-CM | POA: Diagnosis not present

## 2022-11-25 DIAGNOSIS — I639 Cerebral infarction, unspecified: Principal | ICD-10-CM

## 2022-11-25 DIAGNOSIS — Z7984 Long term (current) use of oral hypoglycemic drugs: Secondary | ICD-10-CM

## 2022-11-25 DIAGNOSIS — E119 Type 2 diabetes mellitus without complications: Secondary | ICD-10-CM

## 2022-11-25 DIAGNOSIS — I63411 Cerebral infarction due to embolism of right middle cerebral artery: Principal | ICD-10-CM | POA: Diagnosis present

## 2022-11-25 DIAGNOSIS — G8194 Hemiplegia, unspecified affecting left nondominant side: Secondary | ICD-10-CM | POA: Diagnosis present

## 2022-11-25 DIAGNOSIS — Z86718 Personal history of other venous thrombosis and embolism: Secondary | ICD-10-CM | POA: Diagnosis not present

## 2022-11-25 DIAGNOSIS — D631 Anemia in chronic kidney disease: Secondary | ICD-10-CM | POA: Diagnosis present

## 2022-11-25 DIAGNOSIS — R29810 Facial weakness: Secondary | ICD-10-CM | POA: Diagnosis present

## 2022-11-25 DIAGNOSIS — R531 Weakness: Secondary | ICD-10-CM | POA: Diagnosis not present

## 2022-11-25 DIAGNOSIS — E1165 Type 2 diabetes mellitus with hyperglycemia: Secondary | ICD-10-CM | POA: Diagnosis present

## 2022-11-25 DIAGNOSIS — Z79899 Other long term (current) drug therapy: Secondary | ICD-10-CM

## 2022-11-25 DIAGNOSIS — I129 Hypertensive chronic kidney disease with stage 1 through stage 4 chronic kidney disease, or unspecified chronic kidney disease: Secondary | ICD-10-CM | POA: Diagnosis present

## 2022-11-25 DIAGNOSIS — Z1152 Encounter for screening for COVID-19: Secondary | ICD-10-CM | POA: Diagnosis not present

## 2022-11-25 DIAGNOSIS — G43909 Migraine, unspecified, not intractable, without status migrainosus: Secondary | ICD-10-CM | POA: Diagnosis present

## 2022-11-25 DIAGNOSIS — E785 Hyperlipidemia, unspecified: Secondary | ICD-10-CM | POA: Diagnosis present

## 2022-11-25 DIAGNOSIS — E1122 Type 2 diabetes mellitus with diabetic chronic kidney disease: Secondary | ICD-10-CM | POA: Diagnosis present

## 2022-11-25 DIAGNOSIS — R443 Hallucinations, unspecified: Secondary | ICD-10-CM | POA: Diagnosis present

## 2022-11-25 DIAGNOSIS — N179 Acute kidney failure, unspecified: Secondary | ICD-10-CM | POA: Diagnosis present

## 2022-11-25 DIAGNOSIS — I10 Essential (primary) hypertension: Secondary | ICD-10-CM | POA: Diagnosis present

## 2022-11-25 HISTORY — DX: Migraine, unspecified, not intractable, without status migrainosus: G43.909

## 2022-11-25 LAB — COMPREHENSIVE METABOLIC PANEL
ALT: 14 U/L (ref 0–44)
AST: 21 U/L (ref 15–41)
Albumin: 3.8 g/dL (ref 3.5–5.0)
Alkaline Phosphatase: 52 U/L (ref 38–126)
Anion gap: 9 (ref 5–15)
BUN: 10 mg/dL (ref 8–23)
CO2: 25 mmol/L (ref 22–32)
Calcium: 9.2 mg/dL (ref 8.9–10.3)
Chloride: 103 mmol/L (ref 98–111)
Creatinine, Ser: 1.81 mg/dL — ABNORMAL HIGH (ref 0.61–1.24)
GFR, Estimated: 39 mL/min — ABNORMAL LOW (ref >60)
Glucose, Bld: 148 mg/dL — ABNORMAL HIGH (ref 70–99)
Potassium: 4.7 mmol/L (ref 3.5–5.1)
Sodium: 137 mmol/L (ref 135–145)
Total Bilirubin: 1.2 mg/dL (ref 0.3–1.2)
Total Protein: 6.9 g/dL (ref 6.5–8.1)

## 2022-11-25 LAB — CBC
HCT: 37.7 % — ABNORMAL LOW (ref 39.0–52.0)
HCT: 32.9 % — ABNORMAL LOW (ref 39.0–52.0)
Hemoglobin: 12 g/dL — ABNORMAL LOW (ref 13.0–17.0)
Hemoglobin: 11 g/dL — ABNORMAL LOW (ref 13.0–17.0)
MCH: 30.3 pg (ref 26.0–34.0)
MCH: 30.6 pg (ref 26.0–34.0)
MCHC: 31.8 g/dL (ref 30.0–36.0)
MCHC: 33.4 g/dL (ref 30.0–36.0)
MCV: 95.2 fL (ref 80.0–100.0)
MCV: 91.4 fL (ref 80.0–100.0)
Platelets: 268 10*3/uL (ref 150–400)
Platelets: 249 10*3/uL (ref 150–400)
RBC: 3.96 MIL/uL — ABNORMAL LOW (ref 4.22–5.81)
RBC: 3.6 MIL/uL — ABNORMAL LOW (ref 4.22–5.81)
RDW: 13.2 % (ref 11.5–15.5)
RDW: 13.1 % (ref 11.5–15.5)
WBC: 6.4 10*3/uL (ref 4.0–10.5)
WBC: 7.5 10*3/uL (ref 4.0–10.5)
nRBC: 0 % (ref 0.0–0.2)
nRBC: 0 % (ref 0.0–0.2)

## 2022-11-25 LAB — URINALYSIS, ROUTINE W REFLEX MICROSCOPIC
Bacteria, UA: NONE SEEN
Bilirubin Urine: NEGATIVE
Glucose, UA: NEGATIVE mg/dL
Hgb urine dipstick: NEGATIVE
Ketones, ur: 20 mg/dL — AB
Leukocytes,Ua: NEGATIVE
Nitrite: NEGATIVE
Protein, ur: >=300 mg/dL — AB
Specific Gravity, Urine: 1.014 (ref 1.005–1.030)
pH: 7 (ref 5.0–8.0)

## 2022-11-25 LAB — ETHANOL: Alcohol, Ethyl (B): <10 mg/dL (ref <10)

## 2022-11-25 LAB — RAPID URINE DRUG SCREEN, HOSP PERFORMED
Amphetamines: NOT DETECTED
Barbiturates: NOT DETECTED
Benzodiazepines: NOT DETECTED
Cocaine: NOT DETECTED
Opiates: NOT DETECTED
Tetrahydrocannabinol: NOT DETECTED

## 2022-11-25 LAB — APTT: aPTT: 29 seconds (ref 24–36)

## 2022-11-25 LAB — RESP PANEL BY RT-PCR (RSV, FLU A&B, COVID)  RVPGX2
Influenza A by PCR: NEGATIVE
Influenza B by PCR: NEGATIVE
Resp Syncytial Virus by PCR: NEGATIVE
SARS Coronavirus 2 by RT PCR: NEGATIVE

## 2022-11-25 LAB — DIFFERENTIAL
Abs Immature Granulocytes: 0.02 10*3/uL (ref 0.00–0.07)
Basophils Absolute: 0.1 10*3/uL (ref 0.0–0.1)
Basophils Relative: 1 %
Eosinophils Absolute: 0.1 10*3/uL (ref 0.0–0.5)
Eosinophils Relative: 1 %
Immature Granulocytes: 0 %
Lymphocytes Relative: 15 %
Lymphs Abs: 1 10*3/uL (ref 0.7–4.0)
Monocytes Absolute: 0.4 10*3/uL (ref 0.1–1.0)
Monocytes Relative: 7 %
Neutro Abs: 4.8 10*3/uL (ref 1.7–7.7)
Neutrophils Relative %: 76 %

## 2022-11-25 LAB — I-STAT CHEM 8, ED
BUN: 12 mg/dL (ref 8–23)
Calcium, Ion: 1.2 mmol/L (ref 1.15–1.40)
Chloride: 101 mmol/L (ref 98–111)
Creatinine, Ser: 1.8 mg/dL — ABNORMAL HIGH (ref 0.61–1.24)
Glucose, Bld: 141 mg/dL — ABNORMAL HIGH (ref 70–99)
HCT: 37 % — ABNORMAL LOW (ref 39.0–52.0)
Hemoglobin: 12.6 g/dL — ABNORMAL LOW (ref 13.0–17.0)
Potassium: 4.4 mmol/L (ref 3.5–5.1)
Sodium: 138 mmol/L (ref 135–145)
TCO2: 27 mmol/L (ref 22–32)

## 2022-11-25 LAB — CREATININE, SERUM
Creatinine, Ser: 1.85 mg/dL — ABNORMAL HIGH (ref 0.61–1.24)
GFR, Estimated: 38 mL/min — ABNORMAL LOW (ref >60)

## 2022-11-25 LAB — CBG MONITORING, ED: Glucose-Capillary: 139 mg/dL — ABNORMAL HIGH (ref 70–99)

## 2022-11-25 LAB — PROTIME-INR
INR: 1.1 (ref 0.8–1.2)
Prothrombin Time: 13.9 seconds (ref 11.4–15.2)

## 2022-11-25 MED ORDER — LORAZEPAM 2 MG/ML IJ SOLN
INTRAMUSCULAR | Status: AC
  Filled 2022-11-25: qty 1

## 2022-11-25 MED ORDER — ATORVASTATIN CALCIUM 80 MG PO TABS: 80.0000 mg | ORAL_TABLET | Freq: Every day | ORAL | Status: DC

## 2022-11-25 MED ORDER — MELATONIN 3 MG PO TABS
3.0000 mg | ORAL_TABLET | Freq: Every evening | ORAL | Status: DC | PRN
  Administered 2022-11-26 – 2022-11-27 (×2): 3 mg via ORAL
  Filled 2022-11-25 (×2): qty 1

## 2022-11-25 MED ORDER — LORAZEPAM 2 MG/ML IJ SOLN
0.5000 mg | Freq: Once | INTRAMUSCULAR | Status: AC
  Administered 2022-11-25: 0.5 mg via INTRAVENOUS

## 2022-11-25 MED ORDER — PROCHLORPERAZINE EDISYLATE 10 MG/2ML IJ SOLN
10.0000 mg | Freq: Once | INTRAMUSCULAR | Status: AC
  Administered 2022-11-25: 10 mg via INTRAVENOUS
  Filled 2022-11-25: qty 2

## 2022-11-25 MED ORDER — LORAZEPAM 1 MG PO TABS: 0.5000 mg | ORAL_TABLET | Freq: Once | ORAL | Status: DC

## 2022-11-25 MED ORDER — POLYETHYLENE GLYCOL 3350 17 G PO PACK: 17.0000 g | PACK | Freq: Every day | ORAL | Status: DC | PRN

## 2022-11-25 MED ORDER — SODIUM CHLORIDE 0.9 % IV SOLN: INTRAVENOUS | Status: AC

## 2022-11-25 MED ORDER — ENOXAPARIN SODIUM 30 MG/0.3ML IJ SOSY
30.0000 mg | PREFILLED_SYRINGE | INTRAMUSCULAR | Status: DC
  Administered 2022-11-25: 30 mg via SUBCUTANEOUS
  Filled 2022-11-25: qty 0.3

## 2022-11-25 MED ORDER — KETOROLAC TROMETHAMINE 30 MG/ML IJ SOLN
30.0000 mg | Freq: Once | INTRAMUSCULAR | Status: AC
  Administered 2022-11-25: 30 mg via INTRAVENOUS
  Filled 2022-11-25: qty 1

## 2022-11-25 MED ORDER — ASPIRIN 81 MG PO TBEC
81.0000 mg | DELAYED_RELEASE_TABLET | Freq: Every day | ORAL | Status: DC
  Administered 2022-11-26 – 2022-11-29 (×4): 81 mg via ORAL
  Filled 2022-11-25 (×4): qty 1

## 2022-11-25 MED ORDER — PROCHLORPERAZINE EDISYLATE 10 MG/2ML IJ SOLN: 5.0000 mg | Freq: Four times a day (QID) | INTRAMUSCULAR | Status: DC | PRN

## 2022-11-25 MED ORDER — LABETALOL HCL 5 MG/ML IV SOLN: 5.0000 mg | INTRAVENOUS | Status: DC | PRN

## 2022-11-25 MED ORDER — ACETAMINOPHEN 325 MG PO TABS
650.0000 mg | ORAL_TABLET | Freq: Four times a day (QID) | ORAL | Status: DC | PRN
  Administered 2022-11-26 – 2022-11-29 (×6): 650 mg via ORAL
  Filled 2022-11-25 (×6): qty 2

## 2022-11-25 MED ORDER — SODIUM CHLORIDE 0.9 % IV SOLN
100.0000 mL/h | INTRAVENOUS | Status: DC
  Administered 2022-11-25: 100 mL/h via INTRAVENOUS

## 2022-11-25 MED ORDER — SODIUM CHLORIDE 0.9 % IV BOLUS
500.0000 mL | Freq: Once | INTRAVENOUS | Status: AC
  Administered 2022-11-25: 500 mL via INTRAVENOUS

## 2022-11-25 MED ORDER — CLOPIDOGREL BISULFATE 75 MG PO TABS
75.0000 mg | ORAL_TABLET | Freq: Every day | ORAL | Status: DC
  Administered 2022-11-26 – 2022-11-29 (×4): 75 mg via ORAL
  Filled 2022-11-25 (×4): qty 1

## 2022-11-25 MED ORDER — ASPIRIN 300 MG RE SUPP: 300.0000 mg | Freq: Once | RECTAL | Status: DC

## 2022-11-25 NOTE — ED Notes (Signed)
ED TO INPATIENT HANDOFF REPORT  ED Nurse Name and Phone #: [phone] Jahlani Lorentz  S Name/Age/Gender Devoria Glassing 72 y.o. male Room/Bed: 008C/008C  Code Status   Code Status: Full Code  Home/SNF/Other Home Patient oriented to: self, place, time, and situation Is this baseline? Yes   Triage Complete: Triage complete  Chief Complaint Acute CVA (cerebrovascular accident) [facility]) [I63.9]  Triage Note Pt bib EMS coming from home. Pt reports migraine with nausea, blurred vision and right arm weakness starting yesterday morning around 0700. EMS reported left side weakness and left side lean, unequal sensation, expressive aphasia, blurred vision, gait abnormality with decreased coordination. Pt states these symptoms have been consistent since onset yesterday morning and are typical for his migraines. Pt also reports he fell 2 nights ago, hit his head +LOC. Denies blood thinner use. Pt alert and oriented x 3 in triage.  Pt has difficulty coordinating his left arm but states that his right arm is weak. No right side deficits noted in triage.     Allergies Allergies  Allergen Reactions   Codeine Itching    Level of Care/Admitting Diagnosis ED Disposition     ED Disposition  Admit   Condition  --   Comment  Hospital Area: [place] [[id number]]  Level of Care: Telemetry Medical [104]  May admit patient to [facility] or [facility] if equivalent level of care is available:: No  Covid Evaluation: Asymptomatic - no recent exposure (last 10 days) testing not required  Diagnosis: [id number]  Admitting Physician: Kayleen Memos [id number]  Attending Physician: Kayleen Memos [id number]  Certification:: I certify this patient will need inpatient services for at least 2 midnights  Estimated Length of Stay: 2          B Medical/Surgery History Past Medical History:  Diagnosis Date   Diabetes mellitus without complication  ([place])    Migraine    Past Surgical History:  Procedure Laterality Date   BACK SURGERY     GIVENS CAPSULE STUDY N/A 05/05/2015   Procedure: GIVENS CAPSULE STUDY;  Surgeon: Juanita Craver, MD;  Location: [facility] ENDOSCOPY;  Service: Endoscopy;  Laterality: N/A;     A IV Location/Drains/Wounds Patient Lines/Drains/Airways Status     Active Line/Drains/Airways     Name Placement date Placement time Site Days   Peripheral IV 11/25/22 18 G Left Antecubital 11/25/22  1130  Antecubital  less than 1            Intake/Output Last 24 hours  Intake/Output Summary (Last 24 hours) at 11/25/2022 2045 Last data filed at 11/25/2022 2009 Gross per 24 hour  Intake 1000 ml  Output --  Net 1000 ml    Labs/Imaging Results for orders placed or performed during the hospital encounter of 11/25/22 (from the past 48 hour(s))  CBG monitoring, ED     Status: Abnormal   Collection Time: 11/25/22 11:28 AM  Result Value Ref Range   Glucose-Capillary 139 (H) 70 - 99 mg/dL    Comment: Glucose reference range applies only to samples taken after fasting for at least 8 hours.  Ethanol     Status: None   Collection Time: 11/25/22 11:30 AM  Result Value Ref Range   Alcohol, Ethyl (B) <10 <10 mg/dL    Comment: (NOTE) Lowest detectable limit for serum alcohol is 10 mg/dL.  For medical purposes only. Performed at [facility], [place] [address] [place], [place] [address]   Protime-INR  Status: None   Collection Time: 11/25/22 11:30 AM  Result Value Ref Range   Prothrombin Time 13.9 11.4 - 15.2 seconds   INR 1.1 0.8 - 1.2    Comment: (NOTE) INR goal varies based on device and disease states. Performed at [facility], [place] [address], [place], [place] [address]   APTT     Status: None   Collection Time: 11/25/22 11:30 AM  Result Value Ref Range   aPTT 29 24 - 36 seconds    Comment: Performed at [facility] [address], [place], [place] [address]  CBC     Status: Abnormal    Collection Time: 11/25/22 11:30 AM  Result Value Ref Range   WBC 6.4 4.0 - 10.5 K/uL   RBC 3.96 (L) 4.22 - 5.81 MIL/uL   Hemoglobin 12.0 (L) 13.0 - 17.0 g/dL   HCT 37.7 (L) 39.0 - 52.0 %   MCV 95.2 80.0 - 100.0 fL   MCH 30.3 26.0 - 34.0 pg   MCHC 31.8 30.0 - 36.0 g/dL   RDW 13.2 11.5 - 15.5 %   Platelets 268 150 - 400 K/uL   nRBC 0.0 0.0 - 0.2 %    Comment: Performed at [facility], [place] [address], [place], [place] [address]  Differential     Status: None   Collection Time: 11/25/22 11:30 AM  Result Value Ref Range   Neutrophils Relative % 76 %   Neutro Abs 4.8 1.7 - 7.7 K/uL   Lymphocytes Relative 15 %   Lymphs Abs 1.0 0.7 - 4.0 K/uL   Monocytes Relative 7 %   Monocytes Absolute 0.4 0.1 - 1.0 K/uL   Eosinophils Relative 1 %   Eosinophils Absolute 0.1 0.0 - 0.5 K/uL   Basophils Relative 1 %   Basophils Absolute 0.1 0.0 - 0.1 K/uL   Immature Granulocytes 0 %   Abs Immature Granulocytes 0.02 0.00 - 0.07 K/uL    Comment: Performed at [facility] [address], [place], [place] [address]  Comprehensive metabolic panel     Status: Abnormal   Collection Time: 11/25/22 11:30 AM  Result Value Ref Range   Sodium 137 135 - 145 mmol/L   Potassium 4.7 3.5 - 5.1 mmol/L    Comment: HEMOLYSIS AT THIS LEVEL MAY AFFECT RESULT   Chloride 103 98 - 111 mmol/L   CO2 25 22 - 32 mmol/L   Glucose, Bld 148 (H) 70 - 99 mg/dL    Comment: Glucose reference range applies only to samples taken after fasting for at least 8 hours.   BUN 10 8 - 23 mg/dL   Creatinine, Ser 1.81 (H) 0.61 - 1.24 mg/dL   Calcium 9.2 8.9 - 10.3 mg/dL   Total Protein 6.9 6.5 - 8.1 g/dL   Albumin 3.8 3.5 - 5.0 g/dL   AST 21 15 - 41 U/L    Comment: HEMOLYSIS AT THIS LEVEL MAY AFFECT RESULT   ALT 14 0 - 44 U/L    Comment: HEMOLYSIS AT THIS LEVEL MAY AFFECT RESULT   Alkaline Phosphatase 52 38 - 126 U/L   Total Bilirubin 1.2 0.3 - 1.2 mg/dL    Comment: HEMOLYSIS AT THIS LEVEL MAY AFFECT RESULT   GFR, Estimated  39 (L) >60 mL/min    Comment: (NOTE) Calculated using the CKD-EPI Creatinine Equation (2021)    Anion gap 9 5 - 15    Comment: Performed at [facility], [place]  [address], [place], [place] [address]  Resp panel by RT-PCR (RSV, Flu A&B, Covid) Anterior Nasal Swab     Status: None   Collection Time: 11/25/22 11:45 AM   Specimen: Anterior Nasal Swab  Result Value Ref Range   SARS Coronavirus 2 by RT PCR NEGATIVE NEGATIVE   Influenza A by PCR NEGATIVE NEGATIVE   Influenza B by PCR NEGATIVE NEGATIVE    Comment: (NOTE) The Xpert Xpress SARS-CoV-2/FLU/RSV plus assay is intended as an aid in the diagnosis of influenza from Nasopharyngeal swab specimens and should not be used as a sole basis for treatment. Nasal washings and aspirates are unacceptable for Xpert Xpress SARS-CoV-2/FLU/RSV testing.  Fact Sheet for Patients: EntrepreneurPulse.com.au  Fact Sheet for Healthcare Providers: IncredibleEmployment.be  This test is not yet approved or cleared by the [place] FDA and has been authorized for detection and/or diagnosis of SARS-CoV-2 by FDA under an Emergency Use Authorization (EUA). This EUA will remain in effect (meaning this test can be used) for the duration of the COVID-19 declaration under Section 564(b)(1) of the Act, 21 U.S.C. section 360bbb-3(b)(1), unless the authorization is terminated or revoked.     Resp Syncytial Virus by PCR NEGATIVE NEGATIVE    Comment: (NOTE) Fact Sheet for Patients: EntrepreneurPulse.com.au  Fact Sheet for Healthcare Providers: IncredibleEmployment.be  This test is not yet approved or cleared by the [place] FDA and has been authorized for detection and/or diagnosis of SARS-CoV-2 by FDA under an Emergency Use Authorization (EUA). This EUA will remain in effect (meaning this test can be used) for the duration of the COVID-19 declaration under Section 564(b)(1)  of the Act, 21 U.S.C. section 360bbb-3(b)(1), unless the authorization is terminated or revoked.  Performed at [facility], [place] [address], [place], [place] [address]   I-stat chem 8, ED     Status: Abnormal   Collection Time: 11/25/22 12:31 PM  Result Value Ref Range   Sodium 138 135 - 145 mmol/L   Potassium 4.4 3.5 - 5.1 mmol/L   Chloride 101 98 - 111 mmol/L   BUN 12 8 - 23 mg/dL   Creatinine, Ser 1.80 (H) 0.61 - 1.24 mg/dL   Glucose, Bld 141 (H) 70 - 99 mg/dL    Comment: Glucose reference range applies only to samples taken after fasting for at least 8 hours.   Calcium, Ion 1.20 1.15 - 1.40 mmol/L   TCO2 27 22 - 32 mmol/L   Hemoglobin 12.6 (L) 13.0 - 17.0 g/dL   HCT 37.0 (L) 39.0 - 52.0 %  Urine rapid drug screen (hosp performed)     Status: None   Collection Time: 11/25/22  3:40 PM  Result Value Ref Range   Opiates NONE DETECTED NONE DETECTED   Cocaine NONE DETECTED NONE DETECTED   Benzodiazepines NONE DETECTED NONE DETECTED   Amphetamines NONE DETECTED NONE DETECTED   Tetrahydrocannabinol NONE DETECTED NONE DETECTED   Barbiturates NONE DETECTED NONE DETECTED    Comment: (NOTE) DRUG SCREEN FOR MEDICAL PURPOSES ONLY.  IF CONFIRMATION IS NEEDED FOR ANY PURPOSE, NOTIFY LAB WITHIN 5 DAYS.  LOWEST DETECTABLE LIMITS FOR URINE DRUG SCREEN Drug Class                     Cutoff (ng/mL) Amphetamine and metabolites    1000 Barbiturate and metabolites    200 Benzodiazepine                 200 Opiates and metabolites  300 Cocaine and metabolites        300 THC                            50 Performed at [facility], [place] [address] [place], [place] [address]   Urinalysis, Routine w reflex microscopic -Urine, Clean Catch     Status: Abnormal   Collection Time: 11/25/22  3:40 PM  Result Value Ref Range   Color, Urine YELLOW YELLOW   APPearance CLEAR CLEAR   Specific Gravity, Urine 1.014 1.005 - 1.030   pH 7.0 5.0 - 8.0   Glucose, UA NEGATIVE NEGATIVE  mg/dL   Hgb urine dipstick NEGATIVE NEGATIVE   Bilirubin Urine NEGATIVE NEGATIVE   Ketones, ur 20 (A) NEGATIVE mg/dL   Protein, ur >=300 (A) NEGATIVE mg/dL   Nitrite NEGATIVE NEGATIVE   Leukocytes,Ua NEGATIVE NEGATIVE   RBC / HPF 0-5 0 - 5 RBC/hpf   WBC, UA 0-5 0 - 5 WBC/hpf   Bacteria, UA NONE SEEN NONE SEEN   Squamous Epithelial / HPF 0-5 0 - 5 /HPF   Mucus PRESENT     Comment: Performed at [facility], [place] [address] [place], [place] [address]   MR BRAIN WO CONTRAST  Result Date: 11/25/2022 CLINICAL DATA:  Initial evaluation for neuro deficit, stroke suspected. EXAM: MRI HEAD WITHOUT CONTRAST TECHNIQUE: Multiplanar, multiecho pulse sequences of the brain and surrounding structures were obtained without intravenous contrast. COMPARISON:  Prior CT from earlier the same day. FINDINGS: Brain: Examination is truncated due to the patient's inability to tolerate the full length of the exam. Additionally, provided images are degraded by motion. Mild age-related cerebral atrophy. Patchy T2/FLAIR hyperintensity involving the periventricular deep white matter both cerebral hemispheres, consistent with chronic small vessel ischemic disease, moderate in nature. 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 No associated mass effect. Evaluation for associated hemorrhage limited given lack of gradient echo sequence, however, no hemorrhage seen within this region on prior CT. No mass lesion, midline shift, or mass effect. Mild ventricular prominence related to global parenchymal volume loss of hydrocephalus. No extra-axial fluid collection. Pituitary gland and suprasellar region within normal limits. Vascular: Major intracranial vascular flow voids are maintained. Intracranial vasculature is dolichoectatic in appearance. Skull and upper cervical spine: Craniocervical  junction within normal limits. Bone marrow signal intensity normal. No scalp soft tissue abnormality. Sinuses/Orbits: Prior bilateral ocular lens replacement. Scattered mucosal thickening present about the sphenoid ethmoidal sinuses. Paranasal sinuses are otherwise clear. No mastoid effusion. Other: None. IMPRESSION: 1. 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 2. Underlying mild age-related cerebral atrophy with moderate chronic microvascular ischemic disease. Electronically Signed   By: Jeannine Boga M.D.   On: 11/25/2022 19:47   CT HEAD WO CONTRAST  Result Date: 11/25/2022 CLINICAL DATA:  Neuro deficit, acute, stroke suspected EXAM: CT HEAD WITHOUT CONTRAST TECHNIQUE: Contiguous axial images were obtained from the base of the skull through the vertex without intravenous contrast. RADIATION DOSE REDUCTION: This exam was performed according to the departmental dose-optimization program which includes automated exposure control, adjustment of the mA and/or kV according to patient size and/or use of iterative reconstruction technique. COMPARISON:  CT head 05/16/2009. FINDINGS: Motion limited study. Brain: No evidence of acute large vascular territory infarction, hemorrhage, hydrocephalus, extra-axial collection or mass lesion/mass effect. Patchy white  matter hypodensities, nonspecific but compatible with chronic microvascular ischemic change. Cerebral atrophy. Vascular: Calcific atherosclerosis.  No visible hyperdense vessel. Skull: No acute fracture. Sinuses/Orbits: Clear sinuses.  No acute orbital findings. Other: No mastoid effusions. IMPRESSION: 1. Motion limited study without evidence of acute large vascular territory infarct or acute hemorrhage. 2. Chronic microvascular ischemic change. MRI could provide more sensitive evaluation for white matter infarct if clinically warranted. Electronically Signed   By: Margaretha Sheffield M.D.   On: 11/25/2022 13:01    Pending  Labs Unresulted Labs (From admission, onward)     Start     Ordered   12/02/22 0500  Creatinine, serum  (enoxaparin (LOVENOX)    CrCl >/= 30 ml/min)  Weekly,   R     Comments: while on enoxaparin therapy    11/25/22 2043   11/26/22 0500  Lipid panel  Tomorrow morning,   R        11/25/22 2044   11/26/22 0500  Hemoglobin A1c  Tomorrow morning,   R        11/25/22 2044   11/25/22 2044  CBC  (enoxaparin (LOVENOX)    CrCl >/= 30 ml/min)  Once,   R       Comments: Baseline for enoxaparin therapy IF NOT ALREADY DRAWN.  Notify MD if PLT < 100 K.    11/25/22 2043   11/25/22 2044  Creatinine, serum  (enoxaparin (LOVENOX)    CrCl >/= 30 ml/min)  Once,   R       Comments: Baseline for enoxaparin therapy IF NOT ALREADY DRAWN.    11/25/22 2043            Vitals/Pain Today's Vitals   11/25/22 1830 11/25/22 2000 11/25/22 2015 11/25/22 2030  BP: (!) 128/107 (!) 141/78  125/66  Pulse: 66 69  65  Resp: 19 17  14   Temp:   98.4 F (36.9 C)   TempSrc:   Oral   SpO2: 97% 94%  95%    Isolation Precautions No active isolations  Medications Medications  sodium chloride 0.9 % bolus 500 mL (0 mLs Intravenous Stopped 11/25/22 1556)    Followed by  0.9 %  sodium chloride infusion (0 mL/hr Intravenous Stopped 11/25/22 2009)  enoxaparin (LOVENOX) injection 40 mg (has no administration in time range)  labetalol (NORMODYNE) injection 5 mg (has no administration in time range)  prochlorperazine (COMPAZINE) injection 10 mg (10 mg Intravenous Given 11/25/22 1551)  ketorolac (TORADOL) 30 MG/ML injection 30 mg (30 mg Intravenous Given 11/25/22 1551)    Mobility non-ambulatory     Focused Assessments Neuro Assessment Handoff:  Swallow screen pass? No    NIH Stroke Scale  Dizziness Present: No Headache Present: Yes Interval: Shift assessment Level of Consciousness (1a.)   : Alert, keenly responsive LOC Questions (1b. )   : Answers one question correctly LOC Commands (1c. )   : Performs both  tasks correctly Best Gaze (2. )  : Normal Visual (3. )  : No visual loss Facial Palsy (4. )    : Normal symmetrical movements Motor Arm, Left (5a. )   : No drift Motor Arm, Right (5b. ) : No drift Motor Leg, Left (6a. )  : No drift Motor Leg, Right (6b. ) : No drift Limb Ataxia (7. ): Present in one limb Sensory (8. )  : Mild-to-moderate sensory loss, patient feels pinprick is less sharp or is dull on the affected side, or there is a loss of superficial pain  with pinprick, but patient is aware of being touched Best Language (9. )  : Mild-to-moderate aphasia Dysarthria (10. ): Mild-to-moderate dysarthria, patient slurs at least some words and, at worst, can be understood with some difficulty Extinction/Inattention (11.)   : No Abnormality Complete NIHSS TOTAL: 2     Neuro Assessment: Exceptions to WDL Neuro Checks:   Initial (11/25/22 1143)  Has TPA been given? No If patient is a Neuro Trauma and patient is going to OR before floor call report to [place] nurse: [phone] or [phone]   R Recommendations: See Admitting Provider Note  Report given to:   Additional Notes: Pt with chronic migraines awoke this morning c/o migraine to different spot then normal then lost feeling bilateral arms and had slurred speech per son at bedside who called 911 nih scale currently 4 no tpa mri + for ischemic infarct

## 2022-11-25 NOTE — H&P (Addendum)
History and Physical  HOOPER GRIESEL [id number] DOB: 1950-10-03 DOA: 11/25/2022  Referring physician: Dr. Alvino Chapel, [place] PCP: Chesley Noon, MD  Outpatient Specialists: GI. Patient coming from: Home.  Chief Complaint: Dizziness, left-sided weakness  HPI: [name] is a 72 y.o. male with medical history significant for type 2 diabetes, CKD 3A, iron deficiency anemia, diverticulitis, who presented to [facility] ED from home due to dizziness, nausea, vomiting, left-sided weakness and a headache.  Endorses yesterday morning he started having trouble with headache and nausea from about 7 AM.  Progressively started having difficulty with weakness on the left side of his body.  Associated with some speech difficulty and trouble with coordination and balance.  He spoke to his daughter in law this morning about his symptoms.  She suspected a developing stroke and called EMS.  EMS was activated.  The daughter in law reports he has had some mild confusion today with recurrent vomiting that persisted in the ED.  No use of tobacco, alcohol or illicit drug use.  In the ED, vital signs were notable for elevated BP, a noncontrast head CT revealed chronic microvascular ischemic changes.  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  Underlying mild age-related cerebral atrophy with moderate chronic microvascular ischemic disease.  EDP discussed the case with neurology who will see in consultation.  Recommended admission for stroke workup.  The patient was admitted by [facility], hospitalist service.  ED Course: Tmax 98.4.  BP 125/66, pulse 65, respiratory 14, O2 saturation 95% on room air.  Lab studies remarkable for serum glucose 141, creatinine 1.0.  UA negative for pyuria.  Review of Systems: Review of systems as noted in the HPI. All other systems reviewed and are negative.   Past Medical History:  Diagnosis Date   Diabetes mellitus without  complication ([place])    Migraine    Past Surgical History:  Procedure Laterality Date   BACK SURGERY     GIVENS CAPSULE STUDY N/A 05/05/2015   Procedure: GIVENS CAPSULE STUDY;  Surgeon: Juanita Craver, MD;  Location: [facility] ENDOSCOPY;  Service: Endoscopy;  Laterality: N/A;    Social History:  reports that he has never smoked. He has never used smokeless tobacco. He reports that he does not drink alcohol and does not use drugs.   Allergies  Allergen Reactions   Codeine Itching    Family history: None reported.  Prior to Admission medications   Medication Sig Start Date End Date Taking? Authorizing Provider  amoxicillin-clavulanate (AUGMENTIN) 875-125 MG tablet Take 1 tablet by mouth 2 (two) times daily. 11/21/16   Recardo Evangelist, PA-C  metFORMIN (GLUCOPHAGE) 1000 MG tablet Take 1,000 mg by mouth 2 (two) times daily with a meal.    [provider]  ondansetron (ZOFRAN ODT) 4 MG disintegrating tablet Take 1 tablet (4 mg total) by mouth every 8 (eight) hours as needed for nausea or vomiting. 11/21/16   Recardo Evangelist, PA-C    Physical Exam: BP 125/66   Pulse 65   Temp 98.4 F (36.9 C) (Oral)   Resp 14   SpO2 95%   General: 72 y.o. year-old male well developed well nourished in no acute distress.  Alert and interactive. Cardiovascular: Regular rate and rhythm with no rubs or gallops.  No thyromegaly or JVD noted.  No lower extremity edema. 2/4 pulses in all 4 extremities. Respiratory: Clear to auscultation with no wheezes or rales. Good inspiratory effort. Abdomen: Soft nontender  nondistended with normal bowel sounds x4 quadrants. Muskuloskeletal: No cyanosis, clubbing or edema noted bilaterally Neuro: CN II-XII intact, strength, sensation, reflexes Skin: No ulcerative lesions noted or rashes Psychiatry: Mood is appropriate for condition and setting          Labs on Admission:  Basic Metabolic Panel: Recent Labs  Lab 11/25/22 1130 11/25/22 1231  NA 137 138  K 4.7  4.4  CL 103 101  CO2 25  --   GLUCOSE 148* 141*  BUN 10 12  CREATININE 1.81* 1.80*  CALCIUM 9.2  --    Liver Function Tests: Recent Labs  Lab 11/25/22 1130  AST 21  ALT 14  ALKPHOS 52  BILITOT 1.2  PROT 6.9  ALBUMIN 3.8   No results for input(s): "LIPASE", "AMYLASE" in the last 168 hours. No results for input(s): "AMMONIA" in the last 168 hours. CBC: Recent Labs  Lab 11/25/22 1130 11/25/22 1231  WBC 6.4  --   NEUTROABS 4.8  --   HGB 12.0* 12.6*  HCT 37.7* 37.0*  MCV 95.2  --   PLT 268  --    Cardiac Enzymes: No results for input(s): "CKTOTAL", "CKMB", "CKMBINDEX", "TROPONINI" in the last 168 hours.  BNP (last 3 results) No results for input(s): "BNP" in the last 8760 hours.  ProBNP (last 3 results) No results for input(s): "PROBNP" in the last 8760 hours.  CBG: Recent Labs  Lab 11/25/22 1128  GLUCAP 139*    Radiological Exams on Admission: MR BRAIN WO CONTRAST  Result Date: 11/25/2022 CLINICAL DATA:  Initial evaluation for neuro deficit, stroke suspected. EXAM: MRI HEAD WITHOUT CONTRAST TECHNIQUE: Multiplanar, multiecho pulse sequences of the brain and surrounding structures were obtained without intravenous contrast. COMPARISON:  Prior CT from earlier the same day. FINDINGS: Brain: Examination is truncated due to the patient's inability to tolerate the full length of the exam. Additionally, provided images are degraded by motion. Mild age-related cerebral atrophy. Patchy T2/FLAIR hyperintensity involving the periventricular deep white matter both cerebral hemispheres, consistent with chronic small vessel ischemic disease, moderate in nature. 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 No associated mass effect. Evaluation for associated hemorrhage limited given lack of gradient echo sequence, however, no hemorrhage seen within this  region on prior CT. No mass lesion, midline shift, or mass effect. Mild ventricular prominence related to global parenchymal volume loss of hydrocephalus. No extra-axial fluid collection. Pituitary gland and suprasellar region within normal limits. Vascular: Major intracranial vascular flow voids are maintained. Intracranial vasculature is dolichoectatic in appearance. Skull and upper cervical spine: Craniocervical junction within normal limits. Bone marrow signal intensity normal. No scalp soft tissue abnormality. Sinuses/Orbits: Prior bilateral ocular lens replacement. Scattered mucosal thickening present about the sphenoid ethmoidal sinuses. Paranasal sinuses are otherwise clear. No mastoid effusion. Other: None. IMPRESSION: 1. 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 2. Underlying mild age-related cerebral atrophy with moderate chronic microvascular ischemic disease. Electronically Signed   By: Jeannine Boga M.D.   On: 11/25/2022 19:47   CT HEAD WO CONTRAST  Result Date: 11/25/2022 CLINICAL DATA:  Neuro deficit, acute, stroke suspected EXAM: CT HEAD WITHOUT CONTRAST TECHNIQUE: Contiguous axial images were obtained from the base of the skull through the vertex without intravenous contrast. RADIATION DOSE REDUCTION: This exam was performed according to the departmental dose-optimization program which includes automated exposure control, adjustment of the mA and/or kV according  to patient size and/or use of iterative reconstruction technique. COMPARISON:  CT head 05/16/2009. FINDINGS: Motion limited study. Brain: No evidence of acute large vascular territory infarction, hemorrhage, hydrocephalus, extra-axial collection or mass lesion/mass effect. Patchy white matter hypodensities, nonspecific but compatible with chronic microvascular ischemic change. Cerebral atrophy. Vascular: Calcific atherosclerosis.  No visible hyperdense vessel. Skull: No acute fracture.  Sinuses/Orbits: Clear sinuses.  No acute orbital findings. Other: No mastoid effusions. IMPRESSION: 1. Motion limited study without evidence of acute large vascular territory infarct or acute hemorrhage. 2. Chronic microvascular ischemic change. MRI could provide more sensitive evaluation for white matter infarct if clinically warranted. Electronically Signed   By: Margaretha Sheffield M.D.   On: 11/25/2022 13:01    EKG: I independently viewed the EKG done and my findings are as followed: Sinus rhythm rate of 69.  Nonspecific ST-T changes.  QTc 432.  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  Admitted for stroke workup Frequent neurochecks Permissive hypertension, treat SBP greater than 220 or DBP greater than than 120 with IV labetalol with parameters. Last lipid panel, A1c. PT/OT/speech therapist evaluation Noncontrast CT head, MRI brain completed. Obtain MRI brain without contrast due to renal insufficiency Obtain bilateral carotid artery Doppler ultrasound due to renal insufficiency unable to obtain CTA head and neck. Obtain 2D echo complete with bubble study Fall precaution Aspiration precaution Full dose aspirin rectally 300 mg x 1, angioplasties for evaluation. Low-dose aspirin daily and Plavix daily x 21 days then antiplatelet monotherapy. High intensity statin, Lipitor 80 mg daily Closely monitor on telemetry Neurology consulted, rest of management per neurology.  Acute metabolic encephalopathy Rule out physiologic causes UA negative for pyuria Reorient as needed Aspiration, fall precautions  Intractable nausea and vomiting IV antiemetics as needed IV fluid hydration  Type 2 diabetes with hyperglycemia Obtain hemoglobin A1c Goal hemoglobin A1c less than 70.  AKI on CKD 3A Baseline creatinine appears  to be 1.3 with GFR 53 Presented with creatinine of 1.80 with GFR 39 Avoid nephrotoxic agents, dehydration and hypotension Gentle IVF rehydration LR 75 cc/h x 2 days. Monitor urine output Repeat renal function test in the morning.   DVT prophylaxis: Subcu Lovenox daily  Code Status: Full code  Family Communication: Daughter-in-law at bedside, updated.  Disposition Plan: Admitted to telemetry medical unit  Consults called: Neurology consulted by EDP  Admission status: Inpatient status.   Status is: Inpatient The patient requires at least 2 midnights for further evaluation and treatment of present condition.   Kayleen Memos MD [facility] Pager [phone]  If 7PM-7AM, please contact night-coverage [email or web address] Password [facility]  11/25/2022, 8:47 PM

## 2022-11-25 NOTE — ED Triage Notes (Addendum)
Pt bib EMS coming from home. Pt reports migraine with nausea, blurred vision and right arm weakness starting yesterday morning around 0700. EMS reported left side weakness and left side lean, unequal sensation, expressive aphasia, blurred vision, gait abnormality with decreased coordination. Pt states these symptoms have been consistent since onset yesterday morning and are typical for his migraines. Pt also reports he fell 2 nights ago, hit his head +LOC. Denies blood thinner use. Pt alert and oriented x 3 in triage.  Pt has difficulty coordinating his left arm but states that his right arm is weak. No right side deficits noted in triage.

## 2022-11-25 NOTE — ED Notes (Signed)
Assumed care of pt who just returned from MRI to R/O stroke. Patient has hx of migraines but called his son when this one was in different spot. Son reports he called 911 as pt speech became slurred and he lost feeling in both hands. Pt currently not verbalizing answers to questions asked but a/o x 4 follows commands pending dx results and probable admission

## 2022-11-25 NOTE — ED Provider Notes (Signed)
  Physical Exam  BP (!) 141/78   Pulse 69   Temp 98.4 F (36.9 C) (Oral)   Resp 17   SpO2 94%   Physical Exam  Procedures  Procedures  ED Course / MDM   Clinical Course as of 11/25/22 2025  Thu Nov 25, 2022  1331 Ct scan without acute findings. [JK]  1332 CBC(!) Normal [JK]  1332 Comprehensive metabolic panel(!) Normal [JK]  1351 Discussed with Dr Malen Gauze.    [JK]    Clinical Course User Index [JK] Dorie Rank, MD   Medical Decision Making Amount and/or Complexity of Data Reviewed Labs: ordered. Decision-making details documented in ED Course. Radiology: ordered.  Risk Prescription drug management.   Received patient signed out.  Weakness headache difficulty speaking trouble coordination.  Pending MRI which did show acute stroke.  Not a TNK candidate due to time of onset of a couple days ago.  Will admit.       Davonna Belling, MD 11/25/22 2025

## 2022-11-25 NOTE — ED Provider Notes (Signed)
[facility] Provider Note   CSN: [id number] Arrival date & time: 11/25/22  1127     History  Chief Complaint  Patient presents with   Headache   Extremity Weakness    [name] is a 72 y.o. male.   Headache Extremity Weakness Associated symptoms include headaches.     Patient has a history of diabetes migraines.  He presents to the ED for evaluation of headache weakness lethargy.  Patient reports having a fall couple days ago.  Yesterday morning he started having trouble with headache and nausea about 7 AM.  He attributed to a migraine.  Patient's have persisted and subsequently started feeling difficulty with weakness on the left side difficulty with his speech, trouble with his coordination and balance.  EMS was called this morning.  Patient denies any anticoagulation.  He does complain of persistent headache week.  Home Medications Prior to Admission medications   Medication Sig Start Date End Date Taking? Authorizing Provider  amoxicillin-clavulanate (AUGMENTIN) 875-125 MG tablet Take 1 tablet by mouth 2 (two) times daily. 11/21/16   Recardo Evangelist, PA-C  metFORMIN (GLUCOPHAGE) 1000 MG tablet Take 1,000 mg by mouth 2 (two) times daily with a meal.    [provider]  ondansetron (ZOFRAN ODT) 4 MG disintegrating tablet Take 1 tablet (4 mg total) by mouth every 8 (eight) hours as needed for nausea or vomiting. 11/21/16   Recardo Evangelist, PA-C      Allergies    Codeine    Review of Systems   Review of Systems  Musculoskeletal:  Positive for extremity weakness.  Neurological:  Positive for headaches.    Physical Exam Updated Vital Signs BP (!) 154/102 (BP Location: Right Arm)   Pulse 74   Temp 98.4 F (36.9 C) (Oral)   Resp 18   SpO2 96%  Physical Exam Vitals and nursing note reviewed.  Constitutional:      Appearance: He is well-developed.     Comments: Listless  HENT:     Head: Normocephalic  and atraumatic.     Right Ear: External ear normal.     Left Ear: External ear normal.  Eyes:     General: No scleral icterus.       Right eye: No discharge.        Left eye: No discharge.     Conjunctiva/sclera: Conjunctivae normal.  Neck:     Trachea: No tracheal deviation.  Cardiovascular:     Rate and Rhythm: Normal rate and regular rhythm.  Pulmonary:     Effort: Pulmonary effort is normal. No respiratory distress.     Breath sounds: Normal breath sounds. No stridor. No wheezing or rales.  Abdominal:     General: Bowel sounds are normal. There is no distension.     Palpations: Abdomen is soft.     Tenderness: There is no abdominal tenderness. There is no guarding or rebound.  Musculoskeletal:        General: No tenderness or deformity.     Cervical back: Neck supple.  Skin:    General: Skin is warm and dry.     Findings: No rash.  Neurological:     General: No focal deficit present.     GCS: GCS eye subscore is 4. GCS verbal subscore is 5. GCS motor subscore is 6.     Cranial Nerves: Dysarthria present. No facial asymmetry.     Sensory: No sensory deficit.  Motor: Weakness present. No abnormal muscle tone or seizure activity.     Coordination: Coordination abnormal.     Comments: Expressive aphasia, , Slight dysarthria, weakness bilateral extremities, abnormal finger to nose exam, able to name watch, clock  Psychiatric:        Mood and Affect: Mood normal.     ED Results / Procedures / Treatments   Labs (all labs ordered are listed, but only abnormal results are displayed) Labs Reviewed  CBC - Abnormal; Notable for the following components:      Result Value   RBC 3.96 (*)    Hemoglobin 12.0 (*)    HCT 37.7 (*)    All other components within normal limits  COMPREHENSIVE METABOLIC PANEL - Abnormal; Notable for the following components:   Glucose, Bld 148 (*)    Creatinine, Ser 1.81 (*)    GFR, Estimated 39 (*)    All other components within normal limits   CBG MONITORING, ED - Abnormal; Notable for the following components:   Glucose-Capillary 139 (*)    All other components within normal limits  I-STAT CHEM 8, ED - Abnormal; Notable for the following components:   Creatinine, Ser 1.80 (*)    Glucose, Bld 141 (*)    Hemoglobin 12.6 (*)    HCT 37.0 (*)    All other components within normal limits  RESP PANEL BY RT-PCR (RSV, FLU A&B, COVID)  RVPGX2  ETHANOL  PROTIME-INR  APTT  DIFFERENTIAL  RAPID URINE DRUG SCREEN, HOSP PERFORMED  URINALYSIS, ROUTINE W REFLEX MICROSCOPIC    EKG EKG Interpretation  Date/Time:  Thursday November 25 2022 12:59:19 EDT Ventricular Rate:  69 PR Interval:  145 QRS Duration: 95 QT Interval:  403 QTC Calculation: 432 R Axis:   110 Text Interpretation: Sinus rhythm Right axis deviation Abnormal T, consider ischemia, lateral leads probable limb lead reversal Confirmed by Dorie Rank [phone]) on 11/25/2022 1:04:18 PM  Radiology CT HEAD WO CONTRAST  Result Date: 11/25/2022 CLINICAL DATA:  Neuro deficit, acute, stroke suspected EXAM: CT HEAD WITHOUT CONTRAST TECHNIQUE: Contiguous axial images were obtained from the base of the skull through the vertex without intravenous contrast. RADIATION DOSE REDUCTION: This exam was performed according to the departmental dose-optimization program which includes automated exposure control, adjustment of the mA and/or kV according to patient size and/or use of iterative reconstruction technique. COMPARISON:  CT head 05/16/2009. FINDINGS: Motion limited study. Brain: No evidence of acute large vascular territory infarction, hemorrhage, hydrocephalus, extra-axial collection or mass lesion/mass effect. Patchy white matter hypodensities, nonspecific but compatible with chronic microvascular ischemic change. Cerebral atrophy. Vascular: Calcific atherosclerosis.  No visible hyperdense vessel. Skull: No acute fracture. Sinuses/Orbits: Clear sinuses.  No acute orbital findings. Other: No  mastoid effusions. IMPRESSION: 1. Motion limited study without evidence of acute large vascular territory infarct or acute hemorrhage. 2. Chronic microvascular ischemic change. MRI could provide more sensitive evaluation for white matter infarct if clinically warranted. Electronically Signed   By: Margaretha Sheffield M.D.   On: 11/25/2022 13:01    Procedures Procedures    Medications Ordered in ED Medications  sodium chloride 0.9 % bolus 500 mL (0 mLs Intravenous Stopped 11/25/22 1556)    Followed by  0.9 %  sodium chloride infusion (100 mL/hr Intravenous New Bag/Given 11/25/22 1347)  prochlorperazine (COMPAZINE) injection 10 mg (10 mg Intravenous Given 11/25/22 1551)  ketorolac (TORADOL) 30 MG/ML injection 30 mg (30 mg Intravenous Given 11/25/22 1551)    ED Course/ Medical Decision Making/ A&P Clinical  Course as of 11/25/22 1622  Thu Nov 25, 2022  1331 Ct scan without acute findings. [JK]  1332 CBC(!) Normal [JK]  1332 Comprehensive metabolic panel(!) Normal [JK]  1351 Discussed with Dr Malen Gauze.    [JK]    Clinical Course User Index [JK] Dorie Rank, MD                             Medical Decision Making Amount and/or Complexity of Data Reviewed Labs: ordered. Decision-making details documented in ED Course. Radiology: ordered and independent interpretation performed.  Risk Prescription drug management.   Patient presented to the ED for evaluation of headache lethargy confusion.  Patient does have history of migraines.  Presentation concerning for the possibility of complex migraine, stroke, encephalitis.  Initial CT scan without acute abnormalities.  No evidence of stroke or cerebral hemorrhage.  Discussed case with Dr. Malen Gauze.  Recommends MRI and treatment with migraine cocktail.  Reassess and discuss with neurology post mri. Initial labs otherwise reassuring.  No evidence of severe electro abnormality.  COVID flu RSV negative.  MRI pending at shift change.  Care turned over to Dr  Alvino Chapel        Final Clinical Impression(s) / ED Diagnoses Final diagnoses:  None    Rx / DC Orders ED Discharge Orders     None         Dorie Rank, MD 11/25/22 1623

## 2022-11-26 ENCOUNTER — Inpatient Hospital Stay (HOSPITAL_COMMUNITY): Payer: Medicare Other

## 2022-11-26 DIAGNOSIS — I6389 Other cerebral infarction: Secondary | ICD-10-CM | POA: Diagnosis not present

## 2022-11-26 DIAGNOSIS — R531 Weakness: Secondary | ICD-10-CM | POA: Diagnosis not present

## 2022-11-26 DIAGNOSIS — R202 Paresthesia of skin: Secondary | ICD-10-CM | POA: Diagnosis not present

## 2022-11-26 DIAGNOSIS — I639 Cerebral infarction, unspecified: Secondary | ICD-10-CM

## 2022-11-26 LAB — LIPID PANEL
Cholesterol: 95 mg/dL (ref 0–200)
HDL: 34 mg/dL — ABNORMAL LOW (ref >40)
LDL Cholesterol: 35 mg/dL (ref 0–99)
Total CHOL/HDL Ratio: 2.8 RATIO
Triglycerides: 132 mg/dL (ref <150)
VLDL: 26 mg/dL (ref 0–40)

## 2022-11-26 LAB — CBC WITH DIFFERENTIAL/PLATELET
Abs Immature Granulocytes: 0.01 10*3/uL (ref 0.00–0.07)
Basophils Absolute: 0 10*3/uL (ref 0.0–0.1)
Basophils Relative: 1 %
Eosinophils Absolute: 0.1 10*3/uL (ref 0.0–0.5)
Eosinophils Relative: 1 %
HCT: 31.9 % — ABNORMAL LOW (ref 39.0–52.0)
Hemoglobin: 10.7 g/dL — ABNORMAL LOW (ref 13.0–17.0)
Immature Granulocytes: 0 %
Lymphocytes Relative: 17 %
Lymphs Abs: 1.1 10*3/uL (ref 0.7–4.0)
MCH: 30.5 pg (ref 26.0–34.0)
MCHC: 33.5 g/dL (ref 30.0–36.0)
MCV: 90.9 fL (ref 80.0–100.0)
Monocytes Absolute: 0.4 10*3/uL (ref 0.1–1.0)
Monocytes Relative: 7 %
Neutro Abs: 4.6 10*3/uL (ref 1.7–7.7)
Neutrophils Relative %: 74 %
Platelets: 233 10*3/uL (ref 150–400)
RBC: 3.51 MIL/uL — ABNORMAL LOW (ref 4.22–5.81)
RDW: 13.2 % (ref 11.5–15.5)
WBC: 6.2 10*3/uL (ref 4.0–10.5)
nRBC: 0 % (ref 0.0–0.2)

## 2022-11-26 LAB — BASIC METABOLIC PANEL
Anion gap: 10 (ref 5–15)
BUN: 16 mg/dL (ref 8–23)
CO2: 19 mmol/L — ABNORMAL LOW (ref 22–32)
Calcium: 8.3 mg/dL — ABNORMAL LOW (ref 8.9–10.3)
Chloride: 107 mmol/L (ref 98–111)
Creatinine, Ser: 1.66 mg/dL — ABNORMAL HIGH (ref 0.61–1.24)
GFR, Estimated: 43 mL/min — ABNORMAL LOW (ref >60)
Glucose, Bld: 119 mg/dL — ABNORMAL HIGH (ref 70–99)
Potassium: 4.1 mmol/L (ref 3.5–5.1)
Sodium: 136 mmol/L (ref 135–145)

## 2022-11-26 LAB — ECHOCARDIOGRAM COMPLETE BUBBLE STUDY
Area-P 1/2: 3.08 cm2
S' Lateral: 2.7 cm

## 2022-11-26 LAB — MAGNESIUM: Magnesium: 1.5 mg/dL — ABNORMAL LOW (ref 1.7–2.4)

## 2022-11-26 MED ORDER — METOCLOPRAMIDE HCL 5 MG/ML IJ SOLN
10.0000 mg | Freq: Once | INTRAMUSCULAR | Status: AC
  Administered 2022-11-26: 10 mg via INTRAVENOUS
  Filled 2022-11-26: qty 2

## 2022-11-26 MED ORDER — HYDROCODONE-ACETAMINOPHEN 5-325 MG PO TABS
1.0000 | ORAL_TABLET | Freq: Four times a day (QID) | ORAL | Status: DC | PRN
  Administered 2022-11-26 – 2022-11-27 (×3): 1 via ORAL
  Filled 2022-11-26 (×4): qty 1

## 2022-11-26 MED ORDER — HEPARIN SODIUM (PORCINE) 5000 UNIT/ML IJ SOLN
5000.0000 [IU] | Freq: Three times a day (TID) | INTRAMUSCULAR | Status: DC
  Administered 2022-11-26 – 2022-11-28 (×6): 5000 [IU] via SUBCUTANEOUS
  Filled 2022-11-26 (×6): qty 1

## 2022-11-26 MED ORDER — ROSUVASTATIN CALCIUM 20 MG PO TABS
20.0000 mg | ORAL_TABLET | Freq: Every day | ORAL | Status: DC
  Administered 2022-11-26 – 2022-11-29 (×4): 20 mg via ORAL
  Filled 2022-11-26 (×4): qty 1

## 2022-11-26 MED ORDER — MAGNESIUM SULFATE 2 GM/50ML IV SOLN
2.0000 g | Freq: Once | INTRAVENOUS | Status: AC
  Administered 2022-11-26: 2 g via INTRAVENOUS
  Filled 2022-11-26: qty 50

## 2022-11-26 NOTE — Consult Note (Signed)
Neurology Consultation Reason for Consult: Stroke Referring Physician: Alvino Chapel, N  CC: Stroke  History is obtained from: Patient, family  HPI: [name] is a 72 y.o. male with a history of diabetes who presents with confusion and left-sided weakness and numbness.  He also complained of some blurred vision.  This started Wednesday, possibly as early as 7 AM, worsening over the course of the day.  He also had some nausea and vomiting and headache.   Because of the symptoms his family took him to be evaluated at the emergency department where he was evaluated with an MRI which reveals a right parietal stroke.  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  LKW: 7 AM 3/20 tnk given?: no, outside of window    Past Medical History:  Diagnosis Date   Diabetes mellitus without complication ([place])    Migraine      History reviewed. No pertinent family history.   Social History:  reports that he has never smoked. He has never used smokeless tobacco. He reports that he does not drink alcohol and does not use drugs.   Exam: Current vital signs: BP 123/79 (BP Location: Right Arm)   Pulse 74   Temp 98.4 F (36.9 C) (Axillary)   Resp 18   Ht 5\' 7"  (1.702 m)   SpO2 95%   BMI 25.06 kg/m  Vital signs in last 24 hours: Temp:  [98.4 F (36.9 C)] 98.4 F (36.9 C) (03/22 0310) Pulse Rate:  [65-79] 74 (03/22 0310) Resp:  [12-24] 18 (03/22 0310) BP: (123-154)/(66-107) 123/79 (03/22 0310) SpO2:  [93 %-100 %] 95 % (03/22 0310)   Physical Exam  Appears well-developed and well-nourished.   Neuro: Mental Status: Patient is awake, alert, oriented to person, place, month, year, and situation. Patient is able to give a clear and coherent history. No signs of aphasia, he does appear to have some left-sided neglect Cranial Nerves: II: He has a left lower quadrantanopia. Pupils are equal, round, and reactive to light.   III,IV, VI: EOMI without ptosis or diploplia.  V: Facial sensation is  diminished on the left VII: Facial movement is mildly weak on the left VIII: hearing is intact to voice X: Uvula elevates symmetrically XI: Shoulder shrug is symmetric. XII: tongue is midline without atrophy or fasciculations.  Motor: Tone is normal. Bulk is normal. 5/5 strength was present on the right side, he has left arm dysfunction out of proportion to confrontational weakness, I suspect a significant motor neglect. Sensory: Sensation is diminished on the left, he also extinguishes to double simultaneous stimulation  Cerebellar: He is tremulous on finger-nose-finger bilaterally     I have reviewed labs in epic and the results pertinent to this consultation are: LDL 35 UDS negative Creatinine 1.8  I have reviewed the images obtained: MRI brain-right parietal infarct  Impression: 72 year old male with right parietal infarct which appears embolic.  I think his symptoms are more than expected due to significant neglect, given the location.  He will likely need PT and OT to help his recovery.  He will need an embolic stroke workup.  Of note he does have a history of DVT, but is not on anticoagulation.  Recommendations: - HgbA1c, fasting lipid panel - MRI of the brain without contrast - Frequent neuro checks - Echocardiogram - CTA head and neck - Prophylactic therapy-Antiplatelet med: Aspirin - dose 81mg  and plavix 75mg  daily  - Risk factor modification - Telemetry monitoring - PT consult, OT consult, Speech consult -  Stroke team to follow    Roland Rack, MD [facility] [phone]  If 7pm- 7am, please page neurology on call as listed in [place].

## 2022-11-26 NOTE — Progress Notes (Signed)
  Echocardiogram 2D Echocardiogram has been performed.  [name] Renold Don 11/26/2022, 10:35 AM

## 2022-11-26 NOTE — Evaluation (Signed)
Clinical/Bedside Swallow Evaluation Patient Details  Name: [name] MRN: [id number] Date of Birth: November 11, 1950  Today's Date: 11/26/2022 Time: SLP Start Time (ACUTE ONLY): 0927 SLP Stop Time (ACUTE ONLY): 0939 SLP Time Calculation (min) (ACUTE ONLY): 12 min  Past Medical History:  Past Medical History:  Diagnosis Date   Diabetes mellitus without complication ([place])    Migraine    Past Surgical History:  Past Surgical History:  Procedure Laterality Date   BACK SURGERY     GIVENS CAPSULE STUDY N/A 05/05/2015   Procedure: GIVENS CAPSULE STUDY;  Surgeon: Sean Craver, MD;  Location: [facility];  Service: Endoscopy;  Laterality: N/A;   HPI:  Pt is a 72 yo male presenting to ED from home with dizziness, n/v, L sided weakness, and headache. 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 PMH includes DM    Assessment / Plan / Recommendation  Clinical Impression  Pt reports no swallowing difficulties PTA. He has L side neglect that affects his ability to attend to POs presented on that side and appropriately raise them to his mouth. Oral motor exam revealed L side asymmetry and mild difficulty coodinating some movements. Pt exhibited difficulty forming a labial seal given thin liquids via straw and purees via spoon as well as oral holding during trials of solid textures. He had no observed s/s of aspiration throughout PO trials. Due to pt's awareness deficits and overall mentation, recommend Dys 2 (finely chopped) and thin liquids with full supervision. SLP will f/u as able to address potential for trials of upgraded textures as mentation improves. SLP Visit Diagnosis: Dysphagia, unspecified (R13.10)    Aspiration Risk  Mild aspiration risk    Diet Recommendation Dysphagia 2 (Fine chop);Thin liquid   Liquid Administration via: Straw;Cup Medication Administration: Crushed with puree Supervision: Full supervision/cueing for compensatory strategies;Staff to assist  with self feeding Compensations: Minimize environmental distractions;Slow rate;Small sips/bites Postural Changes: Seated upright at 90 degrees    Other  Recommendations Oral Care Recommendations: Oral care BID    Recommendations for follow up therapy are one component of a multi-disciplinary discharge planning process, led by the attending physician.  Recommendations may be updated based on patient status, additional functional criteria and insurance authorization.  Follow up Recommendations Acute inpatient rehab (3hours/day)      Assistance Recommended at Discharge Frequent or constant Supervision/Assistance  Functional Status Assessment Patient has had a recent decline in their functional status and demonstrates the ability to make significant improvements in function in a reasonable and predictable amount of time.  Frequency and Duration min 2x/week  2 weeks       Prognosis Prognosis for improved oropharyngeal function: Good Barriers to Reach Goals: Cognitive deficits;Language deficits      Swallow Study   General HPI: Pt is a 72 yo male presenting to ED from home with dizziness, n/v, L sided weakness, and headache. 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 PMH includes DM Type of Study: Bedside Swallow Evaluation Previous Swallow Assessment: none in chart Diet Prior to this Study: NPO Temperature Spikes Noted: No Respiratory Status: Room air History of Recent Intubation: No Behavior/Cognition: Alert;Cooperative;Requires cueing Oral Cavity Assessment: Within Functional Limits Oral Care Completed by SLP: No Oral Cavity - Dentition: Adequate natural dentition Vision: Impaired for self-feeding Self-Feeding Abilities: Needs assist Patient Positioning: Upright in bed Baseline Vocal Quality: Normal Volitional Cough: Strong Volitional Swallow: Able to elicit    Oral/Motor/Sensory Function Overall Oral Motor/Sensory Function: Mild impairment Facial  ROM:  Within Functional Limits Facial Symmetry: Abnormal symmetry left Facial Strength: Within Functional Limits Lingual ROM: Within Functional Limits Lingual Symmetry: Within Functional Limits Lingual Strength: Within Functional Limits   Ice Chips Ice chips: Not tested   Thin Liquid Thin Liquid: Impaired Presentation: Straw Oral Phase Impairments: Poor awareness of bolus    Nectar Thick Nectar Thick Liquid: Not tested   Honey Thick Honey Thick Liquid: Not tested   Puree Puree: Within functional limits Presentation: Spoon   Solid     Solid: Impaired Presentation: Self Fed Oral Phase Impairments: Poor awareness of bolus Oral Phase Functional Implications: Oral holding      [name]., Student SLP  11/26/2022,11:05 AM

## 2022-11-26 NOTE — Progress Notes (Addendum)
STROKE TEAM PROGRESS NOTE   INTERVAL HISTORY His family is at the bedside.  He is laying in the bed complaining of a severe headache on medications.  He came in for acute onset confusion left-sided weakness and numbness she is still experiencing.  MRI brain with acute right MCA infarct Echo with EF of 60 to 65% Ultrasound of bilateral lower extremities and carotids are pending  Vitals:   11/25/22 2130 11/25/22 2234 11/26/22 0310 11/26/22 0839  BP: 134/89 123/75 123/79 131/71  Pulse: 69 69 74 74  Resp: 17 16 18 19   Temp:  98.4 F (36.9 C) 98.4 F (36.9 C) 99.1 F (37.3 C)  TempSrc:  Axillary Axillary Oral  SpO2: 93% 93% 95% 97%  Height:  5\' 7"  (1.702 m)     CBC:  Recent Labs  Lab 11/25/22 1130 11/25/22 1231 11/25/22 2233 11/26/22 1122  WBC 6.4  --  7.5 6.2  NEUTROABS 4.8  --   --  4.6  HGB 12.0*   < > 11.0* 10.7*  HCT 37.7*   < > 32.9* 31.9*  MCV 95.2  --  91.4 90.9  PLT 268  --  249 233   < > = values in this interval not displayed.   Basic Metabolic Panel:  Recent Labs  Lab 11/25/22 1130 11/25/22 1231 11/25/22 2233 11/26/22 1122  NA 137 138  --  136  K 4.7 4.4  --  4.1  CL 103 101  --  107  CO2 25  --   --  19*  GLUCOSE 148* 141*  --  119*  BUN 10 12  --  16  CREATININE 1.81* 1.80* 1.85* 1.66*  CALCIUM 9.2  --   --  8.3*  MG  --   --   --  1.5*   Lipid Panel:  Recent Labs  Lab 11/26/22 0459  CHOL 95  TRIG 132  HDL 34*  CHOLHDL 2.8  VLDL 26  LDLCALC 35   HgbA1c: No results for input(s): "HGBA1C" in the last 168 hours. Urine Drug Screen:  Recent Labs  Lab 11/25/22 1540  LABOPIA NONE DETECTED  COCAINSCRNUR NONE DETECTED  LABBENZ NONE DETECTED  AMPHETMU NONE DETECTED  THCU NONE DETECTED  LABBARB NONE DETECTED    Alcohol Level  Recent Labs  Lab 11/25/22 1130  ETH <10    IMAGING past 24 hours ECHOCARDIOGRAM COMPLETE BUBBLE STUDY  Result Date: 11/26/2022    ECHOCARDIOGRAM REPORT   Patient Name:   [name] Date of Exam: 11/26/2022  Medical Rec #:  [id number]        Height:       67.0 in Accession #:    [id number]       Weight:       160.0 lb Date of Birth:  Apr 23, 1951        BSA:          1.839 m Patient Age:    25 years         BP:           131/71 mmHg Patient Gender: M                HR:           78 bpm. Exam Location:  Inpatient Procedure: 2D Echo, Cardiac Doppler and Color Doppler Indications:   Stroke  History:       Patient has no prior history of Echocardiogram examinations.  Stroke.  Sonographer:   Marella Chimes Referring      [id number] Kayleen Memos Phys:  Sonographer Comments: Image acquisition challenging due to uncooperative patient. Patient is unable to lay still for exam. IMPRESSIONS  1. Limited study to 35 images - exam stopped due to patient non-compliance  2. Left ventricular ejection fraction, by estimation, is 60 to 65%. The left ventricle has normal function. The left ventricle has no regional wall motion abnormalities. Left ventricular diastolic parameters are consistent with Grade I diastolic dysfunction (impaired relaxation).  3. The aortic valve is tricuspid. Aortic valve regurgitation is not visualized.  4. The mitral valve is grossly normal. No evidence of mitral valve regurgitation.  5. Right ventricular systolic function was not well visualized. The right ventricular size is not well visualized. FINDINGS  Left Ventricle: Left ventricular ejection fraction, by estimation, is 60 to 65%. The left ventricle has normal function. The left ventricle has no regional wall motion abnormalities. The left ventricular internal cavity size was normal in size. There is  no left ventricular hypertrophy. Left ventricular diastolic parameters are consistent with Grade I diastolic dysfunction (impaired relaxation). Right Ventricle: The right ventricular size is not well visualized. Right vetricular wall thickness was not assessed. Right ventricular systolic function was not well visualized. Left Atrium: Left atrial size  was normal in size. Right Atrium: Right atrial size was normal in size. Pericardium: There is no evidence of pericardial effusion. Mitral Valve: The mitral valve is grossly normal. No evidence of mitral valve regurgitation. Tricuspid Valve: The tricuspid valve is not assessed. Tricuspid valve regurgitation is not demonstrated. Aortic Valve: The aortic valve is tricuspid. Aortic valve regurgitation is not visualized. Pulmonic Valve: The pulmonic valve was not assessed. Pulmonic valve regurgitation is not visualized. Aorta: The aortic root and ascending aorta are structurally normal, with no evidence of dilitation. IAS/Shunts: No atrial level shunt detected by color flow Doppler.  LEFT VENTRICLE PLAX 2D LVIDd:         3.90 cm LVIDs:         2.70 cm LV PW:         1.00 cm LV IVS:        0.90 cm LVOT diam:     2.40 cm LVOT Area:     4.52 cm  LEFT ATRIUM           Index LA diam:      3.80 cm 2.07 cm/m LA Vol (A4C): 43.2 ml 23.49 ml/m   AORTA Ao Root diam: 3.60 cm Ao Asc diam:  3.60 cm MITRAL VALVE MV Area (PHT): 3.08 cm    SHUNTS MV Decel Time: 246 msec    Systemic Diam: 2.40 cm MV E velocity: 61.70 cm/s MV A velocity: 65.60 cm/s MV E/A ratio:  0.94 Lyman Bishop MD Electronically signed by Lyman Bishop MD Signature Date/Time: 11/26/2022/3:00:17 PM    Final    VAS US CAROTID  Result Date: 11/26/2022 Carotid Arterial Duplex Study Patient Name:  [name]  Date of Exam:   11/26/2022 Medical Rec #: [id number]         Accession #:    [id number] Date of Birth: 1950/11/23         Patient Gender: M Patient Age:   8 years Exam Location:  [facility] Procedure:      VAS US CAROTID Referring Phys: Irene Pap --------------------------------------------------------------------------------  Indications:       CVA, Numbness and Weakness. Risk Factors:      Diabetes.  Comparison Study:  No prior study. Performing Technologist: McKayla Maag RVT, [place]  Examination Guidelines: A complete evaluation includes B-mode  imaging, spectral Doppler, color Doppler, and power Doppler as needed of all accessible portions of each vessel. Bilateral testing is considered an integral part of a complete examination. Limited examinations for reoccurring indications may be performed as noted.  Right Carotid Findings: +----------+--------+--------+--------+------------------+--------+           PSV cm/sEDV cm/sStenosisPlaque DescriptionComments +----------+--------+--------+--------+------------------+--------+ CCA Prox  124     23                                         +----------+--------+--------+--------+------------------+--------+ CCA Distal85      22                                         +----------+--------+--------+--------+------------------+--------+ ICA Prox  87      15      1-39%                              +----------+--------+--------+--------+------------------+--------+ ICA Mid   100     35                                tortuous +----------+--------+--------+--------+------------------+--------+ ICA Distal82      22                                tortuous +----------+--------+--------+--------+------------------+--------+ ECA       72      24                                         +----------+--------+--------+--------+------------------+--------+ +----------+--------+-------+----------------+-------------------+           PSV cm/sEDV cmsDescribe        Arm Pressure (mmHG) +----------+--------+-------+----------------+-------------------+ [id number]            Multiphasic, WNL                    +----------+--------+-------+----------------+-------------------+ +---------+--------+--+--------+-+---------+ VertebralPSV cm/s25EDV cm/s8Antegrade +---------+--------+--+--------+-+---------+  Left Carotid Findings: +----------+--------+--------+--------+------------------+--------+           PSV cm/sEDV cm/sStenosisPlaque DescriptionComments  +----------+--------+--------+--------+------------------+--------+ CCA Prox  130     19                                         +----------+--------+--------+--------+------------------+--------+ CCA Distal68      10                                         +----------+--------+--------+--------+------------------+--------+ ICA Prox  58      15      1-39%                              +----------+--------+--------+--------+------------------+--------+ ICA Mid   76  23                                         +----------+--------+--------+--------+------------------+--------+ ICA Distal76      20                                         +----------+--------+--------+--------+------------------+--------+ ECA       106     12                                         +----------+--------+--------+--------+------------------+--------+ +----------+--------+--------+----------------+-------------------+           PSV cm/sEDV cm/sDescribe        Arm Pressure (mmHG) +----------+--------+--------+----------------+-------------------+ Subclavian180             Multiphasic, WNL                    +----------+--------+--------+----------------+-------------------+ +---------+--------+--+--------+--+---------+ VertebralPSV cm/s45EDV cm/s15Antegrade +---------+--------+--+--------+--+---------+   Summary: Right Carotid: Velocities in the right ICA are consistent with a 1-39% stenosis. Left Carotid: Velocities in the left ICA are consistent with a 1-39% stenosis.               Overall, elevated velocities. Vertebrals:  Bilateral vertebral arteries demonstrate antegrade flow. Subclavians: Normal flow hemodynamics were seen in bilateral subclavian              arteries. *See table(s) above for measurements and observations.     Preliminary    VAS [place] LOWER EXTREMITY VENOUS (DVT)  Result Date: 11/26/2022  Lower Venous DVT Study Patient Name:  JIDENNA BERKEMEIER  Date of  Exam:   11/26/2022 Medical Rec #: [id number]         Accession #:    [id number] Date of Birth: [birth year]         Patient Gender: M Patient Age:   55 years Exam Location:  [facility] Procedure:      VAS [place] LOWER EXTREMITY VENOUS (DVT) Referring Phys: Cornelius Moras Kimanh Templeman --------------------------------------------------------------------------------  Indications: Embolic stroke.  Comparison Study: No prior study. Performing Technologist: McKayla Maag RVT, [place]  Examination Guidelines: A complete evaluation includes B-mode imaging, spectral Doppler, color Doppler, and power Doppler as needed of all accessible portions of each vessel. Bilateral testing is considered an integral part of a complete examination. Limited examinations for reoccurring indications may be performed as noted. The reflux portion of the exam is performed with the patient in reverse Trendelenburg.  +---------+---------------+---------+-----------+----------+--------------+ RIGHT    CompressibilityPhasicitySpontaneityPropertiesThrombus Aging +---------+---------------+---------+-----------+----------+--------------+ CFV      Full           Yes      Yes                                 +---------+---------------+---------+-----------+----------+--------------+ SFJ      Full                                                        +---------+---------------+---------+-----------+----------+--------------+ FV Prox  Full                                                        +---------+---------------+---------+-----------+----------+--------------+ FV Mid   Full                                                        +---------+---------------+---------+-----------+----------+--------------+ FV DistalFull                                                        +---------+---------------+---------+-----------+----------+--------------+ PFV      Full                                                         +---------+---------------+---------+-----------+----------+--------------+ POP      Full           Yes      Yes                                 +---------+---------------+---------+-----------+----------+--------------+ PTV      Full                                                        +---------+---------------+---------+-----------+----------+--------------+ PERO     Full                                                        +---------+---------------+---------+-----------+----------+--------------+   +---------+---------------+---------+-----------+----------+--------------+ LEFT     CompressibilityPhasicitySpontaneityPropertiesThrombus Aging +---------+---------------+---------+-----------+----------+--------------+ CFV      Full           Yes      Yes                                 +---------+---------------+---------+-----------+----------+--------------+ SFJ      Full                                                        +---------+---------------+---------+-----------+----------+--------------+ FV Prox  Full                                                        +---------+---------------+---------+-----------+----------+--------------+  FV Mid   Full                                                        +---------+---------------+---------+-----------+----------+--------------+ FV DistalFull                                                        +---------+---------------+---------+-----------+----------+--------------+ PFV      Full                                                        +---------+---------------+---------+-----------+----------+--------------+ POP      Full           Yes      Yes                                 +---------+---------------+---------+-----------+----------+--------------+ PTV      Full                                                         +---------+---------------+---------+-----------+----------+--------------+ PERO     Full                                                        +---------+---------------+---------+-----------+----------+--------------+     Summary: BILATERAL: - No evidence of deep vein thrombosis seen in the lower extremities, bilaterally. - No evidence of superficial venous thrombosis in the lower extremities, bilaterally. -No evidence of popliteal cyst, bilaterally.   *See table(s) above for measurements and observations.    Preliminary    MR ANGIO HEAD WO CONTRAST  Result Date: 11/25/2022 CLINICAL DATA:  Follow-up examination for stroke. EXAM: MRA HEAD WITHOUT CONTRAST TECHNIQUE: Angiographic images of the Circle of Willis were acquired using MRA technique without intravenous contrast. COMPARISON:  Prior brain MRI from earlier the same day. FINDINGS: Anterior circulation: Both ICAs are somewhat dolichoectatic in appearance. Minor atheromatous irregularity about the carotid siphons without stenosis. A1 segments, anterior communicating artery complex common anterior cerebral arteries patent without stenosis. No M1 stenosis or occlusion. No proximal MCA branch occlusion or high-grade stenosis. Distal MCA branches perfused and symmetric. Posterior circulation: Vertebrobasilar system is dolichoectatic in appearance. Both V4 segments widely patent, with the left being dominant. Both PICA patent. Basilar widely patent without stenosis. Superior cerebellar and posterior cerebral arteries patent bilaterally. Anatomic variants: None significant. Other: No intracranial aneurysm. IMPRESSION: 1. Negative intracranial MRA for large vessel occlusion or other emergent finding. No hemodynamically significant or correctable stenosis. 2. Dolichoectatic appearance of the intracranial arterial circulation, suggesting chronic underlying hypertension. Electronically Signed   By: Pincus Badder.D.  On: 11/25/2022 22:27   MR BRAIN  WO CONTRAST  Result Date: 11/25/2022 CLINICAL DATA:  Initial evaluation for neuro deficit, stroke suspected. EXAM: MRI HEAD WITHOUT CONTRAST TECHNIQUE: Multiplanar, multiecho pulse sequences of the brain and surrounding structures were obtained without intravenous contrast. COMPARISON:  Prior CT from earlier the same day. FINDINGS: Brain: Examination is truncated due to the patient's inability to tolerate the full length of the exam. Additionally, provided images are degraded by motion. Mild age-related cerebral atrophy. Patchy T2/FLAIR hyperintensity involving the periventricular deep white matter both cerebral hemispheres, consistent with chronic small vessel ischemic disease, moderate in nature. 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 No associated mass effect. Evaluation for associated hemorrhage limited given lack of gradient echo sequence, however, no hemorrhage seen within this region on prior CT. No mass lesion, midline shift, or mass effect. Mild ventricular prominence related to global parenchymal volume loss of hydrocephalus. No extra-axial fluid collection. Pituitary gland and suprasellar region within normal limits. Vascular: Major intracranial vascular flow voids are maintained. Intracranial vasculature is dolichoectatic in appearance. Skull and upper cervical spine: Craniocervical junction within normal limits. Bone marrow signal intensity normal. No scalp soft tissue abnormality. Sinuses/Orbits: Prior bilateral ocular lens replacement. Scattered mucosal thickening present about the sphenoid ethmoidal sinuses. Paranasal sinuses are otherwise clear. No mastoid effusion. Other: None. IMPRESSION: 1. 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 2. Underlying mild age-related cerebral atrophy with moderate chronic microvascular ischemic  disease. Electronically Signed   By: Jeannine Boga M.D.   On: 11/25/2022 19:47    PHYSICAL EXAM  Temp:  [98.4 F (36.9 C)-99.1 F (37.3 C)] 99.1 F (37.3 C) (03/22 0839) Pulse Rate:  [65-79] 74 (03/22 0839) Resp:  [14-24] 19 (03/22 0839) BP: (123-148)/(66-107) 131/71 (03/22 0839) SpO2:  [93 %-100 %] 97 % (03/22 0839)  General - Well nourished, well developed, in no apparent distress. Cardiovascular - Regular rhythm and rate.  Mental Status -  Level of arousal and orientation to self, year and place.  However got the month, name of hospital and city wrong Language including expression, naming, repetition, comprehension was assessed and found intact. Attention span and concentration were normal.  Cranial Nerves II - XII - II -left lower field cut III, IV, VI - Extraocular movements intact. V -decrease sensation on the left VII -slight facial droop on the left VIII - Hearing & vestibular intact bilaterally. X - Palate elevates symmetrically. XI - Chin turning & shoulder shrug intact bilaterally. XII - Tongue protrusion intact.  Motor Strength -5/5 in right arm and right leg, left arm 4 -/5 in left arm, left leg 5 out of 5 no drift Motor Tone - Muscle tone was assessed at the neck and appendages and was normal. Sensory -decreased sensation on the left  Coordination -ataxia on the left finger-to-nose  Gait and Station - deferred.  ASSESSMENT/PLAN [name] is a 72 y.o. male with history of diabetes, CKD 3A, iron deficiency anemia, diverticulitis, history of DVT in the past per family, migraines who presented to [facility] ED from home due to dizziness, nausea, vomiting, left-sided weakness and a headache.   Stroke: Acute right posterior MCA infarct, etiology unclear, concerning for embolic source CT head No acute abnormality.  Chronic microvascular ischemic changes MRI acute right posterior MCA ischemic infarct MRA no LVO Carotid Doppler unremarkable 2D Echo EF 60  to 65%  LE venous Doppler no DVT Recommend loop recorder to rule out A-fib LDL 35 HgbA1c pending UDS negative VTE prophylaxis -heparin subcu No antithrombotics prior to admission, now on aspirin 81 mg and 75 of Plavix daily for 3 weeks then aspirin alone Therapy recommendations: Pending Disposition: Pending  Hypertension Home meds: None Stable Long-term BP goal normotensive  Hyperlipidemia Home meds: Crestor 20, resumed in hospital LDL 35, goal < 70 Continue statin at discharge  diabetes type II Controlled Home meds: Metformin HgbA1c pending goal < 7.0 CBGs SSI  Other Stroke Risk Factors Advanced Age >/= 37 Migraine  Other Active Problems Acute kidney injury on CKD, creatinine 1.80  Hospital day # [place] DNP, ACNPC-AG  [facility]  ATTENDING NOTE: I reviewed above note and agree with the assessment and plan. Pt was seen and examined.   Wife at bedside.  Patient lying in bed, was having headache now resolved.  Awake alert, orientated to year, people, age and place but not to months.  Left upper extremity mild hemiparesis with dysmetria.  Sensation symmetrical.  MRI showed right parietal cortical infarct, concerning for cardioembolic source.  Other stroke workup so far negative.  LDL 35, A1c pending.  On DAPT for 3 weeks and then as alone.  Increase Crestor from 10-20.  Recommend loop recorder to rule out A-fib.  If not able to perform as inpatient, will recommend to perform as outpatient.  PT/OT pending.  For detailed assessment and plan, please refer to above/below as I have made changes wherever appropriate.   I discussed with Dr. Posey Pronto. I spent additional 30 inpatient minutes face-to-face time with the patient, more than 50% of which was spent in counseling and coordination of care, reviewing test results, images and medication, and discussing the diagnosis, treatment plan and potential prognosis. This patient's care requiresreview of multiple  databases, neurological assessment, discussion with family, other specialists and medical decision making of high complexity.      Rosalin Hawking, MD PhD Stroke Neurology 11/26/2022 9:48 PM    To contact Stroke Continuity provider, please refer to [email or web address] After hours, contact General Neurology

## 2022-11-26 NOTE — Progress Notes (Signed)
PT Cancellation Note  Patient Details Name: [name] MRN: [id number] DOB: 1951-07-27   Cancelled Treatment:    Reason Eval/Treat Not Completed: Pain limiting ability to participate. Pt with c/o severe headache and declining participation in PT eval. RN aware. PT to re-attempt as time allows.   Lorriane Shire 11/26/2022, 10:49 AM

## 2022-11-26 NOTE — Progress Notes (Signed)
OT Cancellation Note  Patient Details Name: [name] MRN: [id number] DOB: [birth year]   Cancelled Treatment:    Reason Eval/Treat Not Completed: Medical issues which prohibited therapy: Pt with c/o severe headache and declining participation in OT eval x 2 attempts. RN aware. OT to re-attempt as time allows.   Julien Girt 11/26/2022, 2:14 PM

## 2022-11-26 NOTE — Progress Notes (Signed)
Bilateral lower extremity venous and bilateral carotid ultrasound studies completed.   Preliminary results relayed to MD and RN.  Please see CV Procedures for preliminary results.  Dayelin Balducci, RVT  12:08 PM 11/26/22

## 2022-11-26 NOTE — Progress Notes (Signed)
[facility] Progress Note Patient: [name] [id number] DOB: 1951-02-03 DOA: 11/25/2022  DOS: the patient was seen and examined on 11/26/2022  Brief hospital course: PMH of CKD 3A, type II DM, migraine, present to the hospital with complaints of left-sided weakness and dizziness.  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  Neurology was consulted.  Stroke workup initiated. Assessment and Plan: Acute right MCA stroke. Possible small vessel disease. CT head without any acute abnormality. MRI confirms acute right posterior MCA infarct. MRA shows no large vessel occlusion. Echocardiogram shows preserved EF of 6065% without any valvular abnormality. Lower extremity Doppler negative for DVT. Carotid Doppler negative for any significant stenosis. PT OT evaluation pending.  SLP evaluation recommending CIR. Currently on dysphagia 2 diet. Not on any thrombophlebitic medication prior to admission.  Currently on 81 mg aspirin and 75 mg Plavix daily.  Continue for 3 weeks and then aspirin alone. Patient will require loop recorder prior to discharge.  HTN. Blood pressure mildly elevated. At home on no medication. Allowing permissive hypertension. Monitor.  Migraine. On Imitrex at home currently on hold due to acute stroke. Continue as needed narcotic medication for pain control.  Nausea. Treated with Reglan. Will monitor.  HLD. LDL 35. Continue statin on discharge and Crestor.  Type 2 diabetes mellitus, controlled without long-term insulin use with CKD. On metformin at home. Hemoglobin A1c currently pending. Continue sliding scale insulin.  CKD 3A. Baseline serum creatinine around 1.8 Currently serum creatinine in the same range. Will monitor.  Anemia likely severe chronic kidney disease. Hemoglobin remaining stable. Monitor for now.  Subjective: Reported headache in the morning.  Also reported nausea.  Currently symptoms resolved.  Per family who is at bedside reports  improvement in neurological symptoms.  Physical Exam: General: in Mild distress, No Rash Cardiovascular: S1 and S2 Present, No Murmur Respiratory: Good respiratory effort, Bilateral Air entry present. No Crackles, No wheezes Abdomen: Bowel Sound present, No tenderness Extremities: No edema Neuro: Alert and oriented x3, no new focal deficit  Data Reviewed: I have Reviewed nursing notes, Vitals, and Lab results. Since last encounter, pertinent lab results CBC and BMP   . I have ordered test including CBC and BMP  . I have discussed pt's care plan and test results with neurology  .   Disposition: Status is: Inpatient Remains inpatient appropriate because: Need further stroke workup  heparin injection 5,000 Units Start: 11/26/22 1400   Family Communication: Family at bedside Level of care: Telemetry Medical   Vitals:   11/25/22 2234 11/26/22 0310 11/26/22 0839 11/26/22 1715  BP: 123/75 123/79 131/71 127/68  Pulse: 69 74 74 78  Resp: 16 18 19 18   Temp: 98.4 F (36.9 C) 98.4 F (36.9 C) 99.1 F (37.3 C) 98.2 F (36.8 C)  TempSrc: Axillary Axillary Oral Oral  SpO2: 93% 95% 97% 98%  Height: 5\' 7"  (1.702 m)        Author: Berle Mull, MD 11/26/2022 8:19 PM  Please look on [email or web address] to find out who is on call.

## 2022-11-26 NOTE — Hospital Course (Signed)
PMH of CKD 3A, type II DM, migraine, present to the hospital with complaints of left-sided weakness and dizziness.  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  Neurology was consulted.  Stroke workup initiated.

## 2022-11-26 NOTE — Evaluation (Signed)
Speech Language Pathology Evaluation Patient Details Name: [name] MRN: [id number] DOB: March 31, 1951 Today's Date: 11/26/2022 Time: 0912-0927 SLP Time Calculation (min) (ACUTE ONLY): 15 min  Problem List:  Patient Active Problem List   Diagnosis Date Noted   Acute CVA (cerebrovascular accident) ([place]) 11/25/2022   Past Medical History:  Past Medical History:  Diagnosis Date   Diabetes mellitus without complication ([place])    Migraine    Past Surgical History:  Past Surgical History:  Procedure Laterality Date   BACK SURGERY     GIVENS CAPSULE STUDY N/A 05/05/2015   Procedure: GIVENS CAPSULE STUDY;  Surgeon: Juanita Craver, MD;  Location: [facility];  Service: Endoscopy;  Laterality: N/A;   HPI:  Pt is a 72 yo male presenting to ED from home with dizziness, n/v, L sided weakness, and headache. 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 PMH includes DM   Assessment / Plan / Recommendation Clinical Impression  Pt reports that he lives at home with his wife and brother-in-law, but handles all medications and finances independently, which his wife confirms. He reports feeling confused since being in the hospital and having a severe headache which limited his ability to participate in a standardized cognitive evaluation. Cognition was then evaluated informally through simple command following and functional tasks. Note difficulties with word-finding in spontaneous speech, functional problem-solving, awareness, and was lethargic. Pt also has L sided neglect, but does attend to that side given prompts. Pt's speech appeared mildly slurred at the conversation level. Recommend SLP f/u to further evaluate cognition and target deficits through functional treatment activities.    SLP Assessment  SLP Recommendation/Assessment: Patient needs continued Speech [place] [facility] SLP Visit Diagnosis: Cognitive communication deficit (R41.841)    Recommendations  for follow up therapy are one component of a multi-disciplinary discharge planning process, led by the attending physician.  Recommendations may be updated based on patient status, additional functional criteria and insurance authorization.    Follow Up Recommendations  Acute inpatient rehab (3hours/day)    Assistance Recommended at Discharge  Frequent or constant Supervision/Assistance  Functional Status Assessment Patient has had a recent decline in their functional status and demonstrates the ability to make significant improvements in function in a reasonable and predictable amount of time.  Frequency and Duration min 2x/week  2 weeks      SLP Evaluation Cognition  Overall Cognitive Status: Impaired/Different from baseline Arousal/Alertness: Awake/alert Orientation Level: Oriented to person Attention: Sustained Sustained Attention: Impaired Sustained Attention Impairment: Verbal basic;Functional basic Memory: Impaired Memory Impairment: Retrieval deficit Awareness: Impaired Awareness Impairment: Emergent impairment Problem Solving: Impaired Problem Solving Impairment: Functional basic       Comprehension  Auditory Comprehension Overall Auditory Comprehension: Appears within functional limits for tasks assessed Visual Recognition/Discrimination Discrimination: Exceptions to [facility] Reading Comprehension Reading Status: Not tested    Expression Expression Primary Mode of Expression: Verbal Verbal Expression Overall Verbal Expression: Impaired Naming: Impairment Verbal Errors: Other (comment) (word finding in spontaneous conversation) Written Expression Written Expression: Not tested   Oral / Motor  Oral Motor/Sensory Function Overall Oral Motor/Sensory Function: Within functional limits Motor Speech Overall Motor Speech: Impaired Articulation: Impaired Level of Impairment: Conversation Intelligibility: Intelligible            Fabio Asa., Student SLP  11/26/2022,  10:43 AM

## 2022-11-27 DIAGNOSIS — I639 Cerebral infarction, unspecified: Secondary | ICD-10-CM | POA: Diagnosis not present

## 2022-11-27 LAB — BASIC METABOLIC PANEL
Anion gap: 7 (ref 5–15)
BUN: 16 mg/dL (ref 8–23)
CO2: 23 mmol/L (ref 22–32)
Calcium: 8.6 mg/dL — ABNORMAL LOW (ref 8.9–10.3)
Chloride: 107 mmol/L (ref 98–111)
Creatinine, Ser: 1.88 mg/dL — ABNORMAL HIGH (ref 0.61–1.24)
GFR, Estimated: 37 mL/min — ABNORMAL LOW (ref >60)
Glucose, Bld: 89 mg/dL (ref 70–99)
Potassium: 4.1 mmol/L (ref 3.5–5.1)
Sodium: 137 mmol/L (ref 135–145)

## 2022-11-27 LAB — CBC
HCT: 31.3 % — ABNORMAL LOW (ref 39.0–52.0)
Hemoglobin: 10.5 g/dL — ABNORMAL LOW (ref 13.0–17.0)
MCH: 31 pg (ref 26.0–34.0)
MCHC: 33.5 g/dL (ref 30.0–36.0)
MCV: 92.3 fL (ref 80.0–100.0)
Platelets: 237 10*3/uL (ref 150–400)
RBC: 3.39 MIL/uL — ABNORMAL LOW (ref 4.22–5.81)
RDW: 13.2 % (ref 11.5–15.5)
WBC: 5.6 10*3/uL (ref 4.0–10.5)
nRBC: 0 % (ref 0.0–0.2)

## 2022-11-27 LAB — HEMOGLOBIN A1C
Hgb A1c MFr Bld: 6.7 % — ABNORMAL HIGH (ref 4.8–5.6)
Mean Plasma Glucose: 146 mg/dL

## 2022-11-27 LAB — MAGNESIUM: Magnesium: 2.4 mg/dL (ref 1.7–2.4)

## 2022-11-27 MED ORDER — DOCUSATE SODIUM 100 MG PO CAPS
100.0000 mg | ORAL_CAPSULE | Freq: Two times a day (BID) | ORAL | Status: DC
  Administered 2022-11-27 – 2022-11-29 (×5): 100 mg via ORAL
  Filled 2022-11-27 (×5): qty 1

## 2022-11-27 MED ORDER — POLYETHYLENE GLYCOL 3350 17 G PO PACK
17.0000 g | PACK | Freq: Every day | ORAL | Status: DC
  Administered 2022-11-27 – 2022-11-29 (×3): 17 g via ORAL
  Filled 2022-11-27 (×3): qty 1

## 2022-11-27 NOTE — Progress Notes (Signed)
STROKE TEAM PROGRESS NOTE   INTERVAL HISTORY His  wife is at the bedside.  His neurological exam is unchanged.  Vital signs are stable.  Lower extremity venous Dopplers are negative for DVT.  Carotid ultrasound showed no significant extracranial stenosis.  Echocardiogram shows normal ejection fraction 65%.  Therapy evaluation recommends inpatient rehab.  Vitals:   11/27/22 0458 11/27/22 0500 11/27/22 0842 11/27/22 1228  BP: 105/74  125/80 139/85  Pulse: 63  63 (!) 58  Resp: (!) 22  18 18   Temp: 98 F (36.7 C)  98.2 F (36.8 C) 98.2 F (36.8 C)  TempSrc: Oral  Oral Oral  SpO2: 96%  95% 97%  Weight:  79.5 kg    Height:       CBC:  Recent Labs  Lab 11/25/22 1130 11/25/22 1231 11/26/22 1122 11/27/22 0150  WBC 6.4   < > 6.2 5.6  NEUTROABS 4.8  --  4.6  --   HGB 12.0*   < > 10.7* 10.5*  HCT 37.7*   < > 31.9* 31.3*  MCV 95.2   < > 90.9 92.3  PLT 268   < > 233 237   < > = values in this interval not displayed.   Basic Metabolic Panel:  Recent Labs  Lab 11/26/22 1122 11/27/22 0150  NA 136 137  K 4.1 4.1  CL 107 107  CO2 19* 23  GLUCOSE 119* 89  BUN 16 16  CREATININE 1.66* 1.88*  CALCIUM 8.3* 8.6*  MG 1.5* 2.4   Lipid Panel:  Recent Labs  Lab 11/26/22 0459  CHOL 95  TRIG 132  HDL 34*  CHOLHDL 2.8  VLDL 26  LDLCALC 35   HgbA1c:  Recent Labs  Lab 11/26/22 0459  HGBA1C 6.7*   Urine Drug Screen:  Recent Labs  Lab 11/25/22 1540  LABOPIA NONE DETECTED  COCAINSCRNUR NONE DETECTED  LABBENZ NONE DETECTED  AMPHETMU NONE DETECTED  THCU NONE DETECTED  LABBARB NONE DETECTED    Alcohol Level  Recent Labs  Lab 11/25/22 1130  ETH <10    IMAGING past 24 hours No results found.  PHYSICAL EXAM  Temp:  [97.7 F (36.5 C)-98.2 F (36.8 C)] 98.2 F (36.8 C) (03/23 1228) Pulse Rate:  [58-78] 58 (03/23 1228) Resp:  [18-22] 18 (03/23 1228) BP: (105-139)/(68-87) 139/85 (03/23 1228) SpO2:  [94 %-98 %] 97 % (03/23 1228) Weight:  [79.5 kg] 79.5 kg (03/23  0500)  General - Well nourished, well developed, in no apparent distress. Cardiovascular - Regular rhythm and rate.  Mental Status -  Level of arousal and orientation to self, year and place.  However got the month, name of hospital and city wrong Language including expression, naming, repetition, comprehension was assessed and found intact. Attention span and concentration were normal.  Cranial Nerves II - XII - II -left lower field cut III, IV, VI - Extraocular movements intact. V -decrease sensation on the left VII -slight facial droop on the left VIII - Hearing & vestibular intact bilaterally. X - Palate elevates symmetrically. XI - Chin turning & shoulder shrug intact bilaterally. XII - Tongue protrusion intact.  Motor Strength -5/5 in right arm and right leg, left arm 4 -/5 in left arm, left leg 5 out of 5 no drift Motor Tone - Muscle tone was assessed at the neck and appendages and was normal. Sensory -decreased sensation on the left  Coordination -ataxia on the left finger-to-nose  Gait and Station - deferred.  ASSESSMENT/PLAN Mr. Sean Bogle  [name] is a 72 y.o. male with history of diabetes, CKD 3A, iron deficiency anemia, diverticulitis, history of DVT in the past per family, migraines who presented to [facility] ED from home due to dizziness, nausea, vomiting, left-sided weakness and a headache.   Stroke: Acute right posterior MCA infarct, etiology unclear, concerning for embolic source CT head No acute abnormality.  Chronic microvascular ischemic changes MRI acute right posterior MCA ischemic infarct MRA no LVO Carotid Doppler unremarkable 2D Echo EF 60 to 65% LE venous Doppler no DVT Recommend loop recorder to rule out A-fib LDL 35 HgbA1c 6.7 UDS negative VTE prophylaxis -heparin subcu No antithrombotics prior to admission, now on aspirin 81 mg and 75 of Plavix daily for 3 weeks then aspirin alone Therapy recommendations: CLR  disposition:  Pending  Hypertension Home meds: None Stable Long-term BP goal normotensive  Hyperlipidemia Home meds: Crestor 20, resumed in hospital LDL 35, goal < 70 Continue statin at discharge  diabetes type II Controlled Home meds: Metformin HgbA1c pending goal < 7.0 CBGs SSI  Other Stroke Risk Factors Advanced Age >/= 59 Migraine  Other Active Problems Acute kidney injury on CKD, creatinine 1.80  Hospital day # 2  Continue ongoing physical occupational therapy.  Mobilize out of bed.  Rehab consult.  Continue aspirin and Plavix for 3 weeks followed by aspirin alone and aggressive risk factor modification.  He will likely need loop recorder at discharge to look for paroxysmal A-fib Long discussion with patient and wife at the bedside and answered questions.  Discussed with Dr. Posey Pronto.  Greater than 50% time during this 35-minute visit was spent in counseling and coordination of care about his embolic stroke and answering questions.Stroke team will sign off. Call for questions.   Antony Contras, MD Stroke Neurology 11/27/2022 2:01 PM    To contact Stroke Continuity provider, please refer to [email or web address] After hours, contact General Neurology

## 2022-11-27 NOTE — Evaluation (Signed)
Physical Therapy Evaluation Patient Details Name: [name] MRN: [id number] DOB: May 03, 1951 Today's Date: 11/27/2022  History of Present Illness  Pt is a 72 y.o. male who presented 11/25/22 with AMS, blurred vision, headache, and L-sided numbness and weakness. 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 PMH: DM, migraine   Clinical Impression  Pt presents with condition above and deficits mentioned below, see PT Problem List. PTA, he was independent without AD, living with his wife in a house with a level entry and fully furnished basement. Pt currently displays deficits in cognition, vision, and expressive communication. In addition, pt is limited by dizziness with all functional mobility and even present when eyes and head were stationary. Pt required up to [place] with transfers and when ambulating with a RW, displaying deficits in balance that place him at high risk for falls. Pt did display x1 LOB, needing modA to recover. As pt is highly motivated to return to being independent, he would benefit from intensive inpatient therapy (>3 hours/day). Will continue to follow acutely.      Recommendations for follow up therapy are one component of a multi-disciplinary discharge planning process, led by the attending physician.  Recommendations may be updated based on patient status, additional functional criteria and insurance authorization.  Follow Up Recommendations Acute inpatient rehab (3hours/day)      Assistance Recommended at Discharge Frequent or constant Supervision/Assistance  Patient can return home with the following  A lot of help with walking and/or transfers;A little help with bathing/dressing/bathroom;Assistance with cooking/housework;Direct supervision/assist for medications management;Direct supervision/assist for financial management;Assist for transportation    Equipment Recommendations None recommended by PT (has all needed DME)   Recommendations for Other Services  Rehab consult    Functional Status Assessment Patient has had a recent decline in their functional status and demonstrates the ability to make significant improvements in function in a reasonable and predictable amount of time.     Precautions / Restrictions Precautions Precautions: Fall Precaution Comments: dizzy with all mobility Restrictions Weight Bearing Restrictions: No      Mobility  Bed Mobility Overal bed mobility: Needs Assistance Bed Mobility: Supine to Sit     Supine to sit: Min guard, HOB elevated     General bed mobility comments: min gaurd for safety    Transfers Overall transfer level: Needs assistance Equipment used: Rolling walker (2 wheels) Transfers: Sit to/from Stand Sit to Stand: Min assist, Mod assist, +2 safety/equipment           General transfer comment: up to mod A due to LOB when standing with pt propping his legs against the bed for support, trunk sway in all directions noted, likely due to dizziness    Ambulation/Gait Ambulation/Gait assistance: Min assist, Mod assist, +2 safety/equipment Gait Distance (Feet): 160 Feet Assistive device: Rolling walker (2 wheels), None Gait Pattern/deviations: Step-through pattern, Decreased stride length, Drifts right/left, Narrow base of support, Scissoring Gait velocity: reduced Gait velocity interpretation: <1.31 ft/sec, indicative of household ambulator   General Gait Details: Pt taking small, narrow steps, intermittently scissoring and drifting laterally with instability noted, dizziness likely a contributing factor. Needs assistance to manage RW when turning. Took a few steps in the room without the RW but pt holding onto bed rail for support. Min-modA for balance, displaying x1 posterior and L lateral LOB needing modA to recover  Stairs            Wheelchair Mobility    Modified Rankin (Stroke Patients Only)  Modified Rankin (Stroke Patients  Only) Pre-Morbid Rankin Score: No symptoms Modified Rankin: Moderately severe disability     Balance Overall balance assessment: Needs assistance Sitting-balance support: Feet supported, Single extremity supported Sitting balance-Leahy Scale: Fair Sitting balance - Comments: trunk sway in all directions noted, min guard for safety   Standing balance support: During functional activity, Reliant on assistive device for balance, Bilateral upper extremity supported Standing balance-Leahy Scale: Poor Standing balance comment: reliant on RW and PT for assistance at times                             Pertinent Vitals/Pain Pain Assessment Pain Assessment: No/denies pain    Home Living Family/patient expects to be discharged to:: Private residence Living Arrangements: Spouse/significant other Available Help at Discharge: Available 24 hours/day Type of Home: House Home Access: Level entry     Alternate Level Stairs-Number of Steps: basement refinished, patient does not go to basement Home Layout: Full bath on main level;One level Home Equipment: Shower seat - built in;Grab bars - toilet;Grab bars - tub/shower;Rolling Walker (2 wheels);BSC/3in1;Wheelchair - manual;Other (comment);Hospital bed (Adaptive equipment)      Prior Function Prior Level of Function : Independent/Modified Independent;Driving             Mobility Comments: walking without AD, no falls ADLs Comments: driving, managed the house, cooked and cleaned, managed the finances     Hand Dominance   Dominant Hand: Right    Extremity/Trunk Assessment   Upper Extremity Assessment Upper Extremity Assessment: Defer to OT evaluation    Lower Extremity Assessment Lower Extremity Assessment: Overall WFL for tasks assessed (MMT scores of 5 grossly bil, denied numbness/tingling bil except baseline peripheral neuropathy in bil feet, coordination intact bil)    Cervical / Trunk Assessment Cervical / Trunk  Assessment: Normal  Communication   Communication: Expressive difficulties  Cognition Arousal/Alertness: Awake/alert Behavior During Therapy: WFL for tasks assessed/performed Overall Cognitive Status: Impaired/Different from baseline Area of Impairment: Orientation, Memory, Attention, Following commands, Safety/judgement, Awareness, Problem solving                 Orientation Level: Time (extra time to recall date, unable to recall day of the week) Current Attention Level: Focused Memory: Decreased recall of precautions, Decreased short-term memory Following Commands: Follows one step commands with increased time, Follows multi-step commands inconsistently Safety/Judgement: Decreased awareness of safety, Decreased awareness of deficits Awareness: Emergent Problem Solving: Slow processing, Decreased initiation, Difficulty sequencing, Requires verbal cues, Requires tactile cues General Comments: Expressive difficulties throughout, patient would get very quiet and would state everything was ok, but per daughter in law could not recall a component of a story earlier that morning and has been quiet ever since. Decreased attention span noted throughout, strategies provided to both patient and daughter in law        General Comments General comments (skin integrity, edema, etc.): dizziness with head and eyes stationary and with all movement throughout session, did not note any nystagmus though    Exercises     Assessment/Plan    PT Assessment Patient needs continued PT services  PT Problem List Decreased activity tolerance;Decreased mobility;Decreased balance;Decreased cognition;Decreased knowledge of use of DME       PT Treatment Interventions DME instruction;Gait training;Stair training;Therapeutic activities;Functional mobility training;Therapeutic exercise;Balance training;Neuromuscular re-education;Cognitive remediation;Patient/family education    PT Goals (Current goals can be  found in the Care Plan section)  Acute Rehab PT Goals Patient Stated Goal:  to improve PT Goal Formulation: With patient/family Time For Goal Achievement: 12/11/22 Potential to Achieve Goals: Good    Frequency Min 4X/week     Co-evaluation PT/OT/SLP Co-Evaluation/Treatment: Yes Reason for Co-Treatment: Complexity of the patient's impairments (multi-system involvement);Necessary to address cognition/behavior during functional activity;For patient/therapist safety;To address functional/ADL transfers PT goals addressed during session: Mobility/safety with mobility;Balance;Proper use of DME OT goals addressed during session: ADL's and self-care;Proper use of Adaptive equipment and DME;Strengthening/ROM       AM-PAC PT "6 Clicks" Mobility  Outcome Measure Help needed turning from your back to your side while in a flat bed without using bedrails?: A Little Help needed moving from lying on your back to sitting on the side of a flat bed without using bedrails?: A Little Help needed moving to and from a bed to a chair (including a wheelchair)?: A Lot Help needed standing up from a chair using your arms (e.g., wheelchair or bedside chair)?: A Lot Help needed to walk in hospital room?: A Lot Help needed climbing 3-5 steps with a railing? : Total 6 Click Score: 13    End of Session Equipment Utilized During Treatment: Gait belt Activity Tolerance: Patient tolerated treatment well Patient left: in chair;with call bell/phone within reach;with chair alarm set;with family/visitor present Nurse Communication: Mobility status PT Visit Diagnosis: Unsteadiness on feet (R26.81);Other abnormalities of gait and mobility (R26.89);Difficulty in walking, not elsewhere classified (R26.2);Dizziness and giddiness (R42);Other symptoms and signs involving the nervous system (R29.898)    Time: [id number] PT Time Calculation (min) (ACUTE ONLY): 32 min   Charges:   PT Evaluation $PT Eval Moderate Complexity: 1  Mod          Moishe Spice, PT, DPT Acute Rehabilitation Services  Office: [phone]   Orvan Falconer 11/27/2022, 1:52 PM

## 2022-11-27 NOTE — Progress Notes (Signed)
Inpatient Rehab Admissions Coordinator Note:   Per therapy patient was screened for CIR candidacy by Harleigh Civello Danford Bad, CCC-SLP. At this time, pt appears to be a potential candidate for CIR. I will place an order for rehab consult for full assessment, per our protocol.  Please contact me any with questions.[name], [place], [place] Admissions Coordinator [phone] 11/27/22 4:40 PM

## 2022-11-27 NOTE — Evaluation (Signed)
Occupational Therapy Evaluation Patient Details Name: [name] MRN: [id number] DOB: Jun 17, 1951 Today's Date: 11/27/2022   History of Present Illness Pt is a 72 y.o. male who presented 11/25/22 with AMS, blurred vision, headache, and L-sided numbness and weakness. 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 PMH: DM, migraine   Clinical Impression   Prior to this admission, patient was independent in ADLs and functional mobility, still driving, and managing the finances, cooking, housework and all other tasks with regard to home management. Currently, patient presenting with expressive difficulties, cognitive deficits (see cognition below), visual impairments and dizziness, and need for increased assist to complete ADLs and functional mobility. Patient with significant difficulty following commands for visual assessment, and then unable to track past midline on RIGHT side. Patient with decreased peripheral vision on RIGHT side, as well as endorsing dizziness throughout session. OT will continue to assess. Patient with need for up to mod A for ADLs and functional mobility. Patient would benefit from intensive therapy >3 hours a day. OT will continue to follow acutely.      Recommendations for follow up therapy are one component of a multi-disciplinary discharge planning process, led by the attending physician.  Recommendations may be updated based on patient status, additional functional criteria and insurance authorization.   Follow Up Recommendations  Acute inpatient rehab (3hours/day)     Assistance Recommended at Discharge Frequent or constant Supervision/Assistance (initally)  Patient can return home with the following A lot of help with bathing/dressing/bathroom;A little help with walking and/or transfers;Assistance with cooking/housework;Direct supervision/assist for medications management;Direct supervision/assist for financial  management;Assist for transportation;Help with stairs or ramp for entrance    Functional Status Assessment  Patient has had a recent decline in their functional status and demonstrates the ability to make significant improvements in function in a reasonable and predictable amount of time.  Equipment Recommendations  Other (comment) (Defer to next venue)    Recommendations for Other Services Rehab consult     Precautions / Restrictions Precautions Precautions: Fall Restrictions Weight Bearing Restrictions: No      Mobility Bed Mobility Overal bed mobility: Needs Assistance Bed Mobility: Supine to Sit     Supine to sit: Min guard     General bed mobility comments: min gaurd for safety    Transfers Overall transfer level: Needs assistance Equipment used: Rolling walker (2 wheels) Transfers: Sit to/from Stand Sit to Stand: Min assist, Mod assist           General transfer comment: up to mod A due to LOB when standing as well as decreased gait speed, occasional scissored gait, and increased assist needed to manage turning      Balance Overall balance assessment: Needs assistance Sitting-balance support: Feet supported, Single extremity supported Sitting balance-Leahy Scale: Fair     Standing balance support: During functional activity, Reliant on assistive device for balance, Bilateral upper extremity supported Standing balance-Leahy Scale: Poor Standing balance comment: reliant on RW and PT for assistance at times                           ADL either performed or assessed with clinical judgement   ADL Overall ADL's : Needs assistance/impaired Eating/Feeding: Set up;Sitting   Grooming: Set up;Sitting   Upper Body Bathing: Min guard;Sitting   Lower Body Bathing: Moderate assistance;Sit to/from stand;Sitting/lateral leans   Upper Body Dressing : Min guard;Sitting   Lower Body Dressing: Moderate assistance;Sitting/lateral leans;Sit to/from  stand    Toilet Transfer: Moderate assistance;Ambulation;Rolling walker (2 wheels)   Toileting- Clothing Manipulation and Hygiene: Minimal assistance;Sitting/lateral lean;Sit to/from stand       Functional mobility during ADLs: Minimal assistance;Moderate assistance;Cueing for sequencing;Cueing for safety;Rolling walker (2 wheels) General ADL Comments: Patient presenting with expressive difficulties, cognitive deficits, visual impairments and dizziness, and need for increased assist to complete ADLs and functional mobility.     Vision Baseline Vision/History: 1 Wears glasses (Readers) Ability to See in Adequate Light: 0 Adequate Patient Visual Report: Blurring of vision;Other (comment) (patient stating he was seeing a dog in the hallway) Vision Assessment?: Yes Eye Alignment: Within Functional Limits Ocular Range of Motion: Restricted on the right Alignment/Gaze Preference: Within Defined Limits Tracking/Visual Pursuits: Right eye does not track laterally;Decreased smoothness of horizontal tracking;Requires cues, head turns, or add eye shifts to track Saccades: Undershoots;Overshoots;Decreased speed of saccadic movement Convergence: Within functional limits Visual Fields: Right visual field deficit;Impaired-to be further tested in functional context Depth Perception: Overshoots;Undershoots Additional Comments: Patient with significant difficulty following commands for visual assessment, and then unable to track past midline on RIGHT side. Patient with decreased peripheral vision on RIGHT side, as well as endorsing dizziness, and then stating that he saw a dog in the hallway when he was walking (question vision or expressive difficulties)     Perception Perception Perception Tested?: Yes Perception Deficits: Inattention/neglect Inattention/Neglect: Does not attend to right visual field;Impaired- to be further tested in functional context Spatial deficits: difficulty when attempting to turn with  RW, often crossing feet with near or LOB   Praxis      Pertinent Vitals/Pain Pain Assessment Pain Assessment: No/denies pain     Hand Dominance Right   Extremity/Trunk Assessment Upper Extremity Assessment Upper Extremity Assessment: Overall WFL for tasks assessed   Lower Extremity Assessment Lower Extremity Assessment: Defer to PT evaluation   Cervical / Trunk Assessment Cervical / Trunk Assessment: Normal   Communication Communication Communication: Expressive difficulties   Cognition Arousal/Alertness: Awake/alert Behavior During Therapy: WFL for tasks assessed/performed Overall Cognitive Status: Impaired/Different from baseline Area of Impairment: Orientation, Memory, Attention, Following commands, Safety/judgement, Awareness, Problem solving                 Orientation Level: Time (extra time to recall date, unable to recall day of the week) Current Attention Level: Focused Memory: Decreased recall of precautions, Decreased short-term memory Following Commands: Follows one step commands with increased time, Follows multi-step commands inconsistently Safety/Judgement: Decreased awareness of safety, Decreased awareness of deficits Awareness: Emergent Problem Solving: Slow processing, Decreased initiation, Difficulty sequencing, Requires verbal cues, Requires tactile cues General Comments: Expressive difficulties throughout, patient would get very quiet and would state everything was ok, but per daughter in law could not recall a component of a story earlier that morning and has been quiet ever since. Decreased attention span noted throughout, strategies provided to both patient and daughter in law     General Comments       Exercises     Shoulder Instructions      Home Living Family/patient expects to be discharged to:: Private residence Living Arrangements: Spouse/significant other Available Help at Discharge: Available 24 hours/day Type of Home:  House Home Access: Level entry     Home Layout: Full bath on main level;One level Alternate Level Stairs-Number of Steps: basement refinished, patient does not go to basementq   Bathroom Shower/Tub: Occupational psychologist: Standard Bathroom Accessibility: Yes How Accessible: Accessible via walker Home Equipment:  Shower seat - built in;Grab bars - toilet;Grab bars - tub/shower;Rolling Walker (2 wheels);BSC/3in1;Wheelchair - manual;Other (comment);Hospital bed (Adaptive equipment)      Lives With: Spouse;Family (brother in Sports coach)    Prior Functioning/Environment Prior Level of Function : Independent/Modified Independent;Driving             Mobility Comments: walking without AD ADLs Comments: driving, managed the house, cooked and cleaned, managed the finances        OT Problem List: Decreased activity tolerance;Impaired balance (sitting and/or standing);Impaired vision/perception;Decreased coordination;Decreased cognition;Decreased safety awareness;Decreased knowledge of use of DME or AE;Decreased knowledge of precautions      OT Treatment/Interventions: Self-care/ADL training;Therapeutic exercise;Neuromuscular education;Energy conservation;DME and/or AE instruction;Therapeutic activities;Cognitive remediation/compensation;Visual/perceptual remediation/compensation;Patient/family education;Balance training    OT Goals(Current goals can be found in the care plan section) Acute Rehab OT Goals Patient Stated Goal: to get better OT Goal Formulation: With patient/family Time For Goal Achievement: 12/11/22 Potential to Achieve Goals: Good  OT Frequency: Min 2X/week    Co-evaluation PT/OT/SLP Co-Evaluation/Treatment: Yes Reason for Co-Treatment: Complexity of the patient's impairments (multi-system involvement);Necessary to address cognition/behavior during functional activity;For patient/therapist safety;To address functional/ADL transfers PT goals addressed during  session: Mobility/safety with mobility;Balance;Proper use of DME OT goals addressed during session: ADL's and self-care;Proper use of Adaptive equipment and DME;Strengthening/ROM      AM-PAC OT "6 Clicks" Daily Activity     Outcome Measure Help from another person eating meals?: None Help from another person taking care of personal grooming?: None Help from another person toileting, which includes using toliet, bedpan, or urinal?: A Lot Help from another person bathing (including washing, rinsing, drying)?: A Lot Help from another person to put on and taking off regular upper body clothing?: A Little Help from another person to put on and taking off regular lower body clothing?: A Lot 6 Click Score: 17   End of Session Equipment Utilized During Treatment: Gait belt;Rolling walker (2 wheels) Nurse Communication: Mobility status  Activity Tolerance: Patient tolerated treatment well Patient left: with call bell/phone within reach;with chair alarm set;with family/visitor present;in chair  OT Visit Diagnosis: Unsteadiness on feet (R26.81);Other abnormalities of gait and mobility (R26.89);Muscle weakness (generalized) (M62.81);Other symptoms and signs involving the nervous system (R29.898);Other symptoms and signs involving cognitive function;Dizziness and giddiness (R42)                Time: [id number] OT Time Calculation (min): 31 min Charges:  OT General Charges $OT Visit: 1 Visit OT Evaluation $OT Eval Moderate Complexity: 1 Mod  Corinne Ports E. Ayjah Show, OTR/L Acute Rehabilitation Services [phone]   Ascencion Dike 11/27/2022, 12:37 PM

## 2022-11-27 NOTE — Progress Notes (Signed)
[facility] Progress Note Patient: [name] [id number] DOB: 12/16/1950 DOA: 11/25/2022  DOS: the patient was seen and examined on 11/27/2022  Brief hospital course: PMH of CKD 3A, type II DM, migraine, present to the hospital with complaints of left-sided weakness and dizziness.  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  Neurology was consulted.  Stroke workup initiated. Assessment and Plan: Acute right MCA stroke. Possible small vessel disease. CT head without any acute abnormality. MRI confirms acute right posterior MCA infarct. MRA shows no large vessel occlusion. Echocardiogram shows preserved EF of 6065% without any valvular abnormality. Lower extremity Doppler negative for DVT. Carotid Doppler negative for any significant stenosis. PT OT as well as SLP evaluation recommending CIR. Currently on dysphagia 2 diet. Not on any thrombophlebitic medication prior to admission.  Currently on 81 mg aspirin and 75 mg Plavix daily.  Continue for 3 weeks and then aspirin alone. Patient will require loop recorder prior to discharge.  HTN. Blood pressure mildly elevated. At home on no medication. Allowing permissive hypertension. Monitor.  Migraine. On Imitrex at home currently on hold due to acute stroke. Continue as needed narcotic medication for pain control.  Nausea. Treated with Reglan.  Now resolved. Will monitor.  HLD. LDL 35. Continue statin on discharge and Crestor.  Type 2 diabetes mellitus, controlled without long-term insulin use with CKD. On metformin at home. Hemoglobin A1c currently pending. Continue sliding scale insulin.  CKD 3A. Baseline serum creatinine around 1.8 Currently serum creatinine in the same range. Will monitor.  Anemia likely severe chronic kidney disease. Hemoglobin remaining stable. Monitor for now.  Subjective: Headache still present but improving.  No nausea no vomiting no fever no chills.  Oral intake improving although  prefers not to eat hospital food.  Physical Exam: Clear to auscultation. S1-S2 present. Bowel sound present. Dysarthria present.  Occasional expressive aphasia seen.  Motor strength adequate.  Minimally lower on left side  Data Reviewed: I have Reviewed nursing notes, Vitals, and Lab results. Since last encounter, pertinent lab results CBC and BMP   . I have ordered test including CBC and BMP  . I have discussed pt's care plan and test results with neurology  .   Disposition: Status is: Inpatient Remains inpatient appropriate because: Need CIR.  Medically stable.  heparin injection 5,000 Units Start: 11/26/22 1400   Family Communication: Family at bedside Level of care: Telemetry Medical   Vitals:   11/27/22 0500 11/27/22 0842 11/27/22 1228 11/27/22 1509  BP:  125/80 139/85 (!) 142/72  Pulse:  63 (!) 58 (!) 57  Resp:  18 18 18   Temp:  98.2 F (36.8 C) 98.2 F (36.8 C) 98.1 F (36.7 C)  TempSrc:  Oral Oral Oral  SpO2:  95% 97% 96%  Weight: 79.5 kg     Height:         Author: Berle Mull, MD 11/27/2022 5:49 PM  Please look on [email or web address] to find out who is on call.

## 2022-11-28 DIAGNOSIS — I639 Cerebral infarction, unspecified: Secondary | ICD-10-CM | POA: Diagnosis not present

## 2022-11-28 LAB — CBC
HCT: 30.8 % — ABNORMAL LOW (ref 39.0–52.0)
Hemoglobin: 10.2 g/dL — ABNORMAL LOW (ref 13.0–17.0)
MCH: 30.6 pg (ref 26.0–34.0)
MCHC: 33.1 g/dL (ref 30.0–36.0)
MCV: 92.5 fL (ref 80.0–100.0)
Platelets: 223 10*3/uL (ref 150–400)
RBC: 3.33 MIL/uL — ABNORMAL LOW (ref 4.22–5.81)
RDW: 13.2 % (ref 11.5–15.5)
WBC: 4.5 10*3/uL (ref 4.0–10.5)
nRBC: 0 % (ref 0.0–0.2)

## 2022-11-28 LAB — BASIC METABOLIC PANEL
Anion gap: 7 (ref 5–15)
BUN: 16 mg/dL (ref 8–23)
CO2: 21 mmol/L — ABNORMAL LOW (ref 22–32)
Calcium: 8.9 mg/dL (ref 8.9–10.3)
Chloride: 108 mmol/L (ref 98–111)
Creatinine, Ser: 1.63 mg/dL — ABNORMAL HIGH (ref 0.61–1.24)
GFR, Estimated: 44 mL/min — ABNORMAL LOW (ref >60)
Glucose, Bld: 91 mg/dL (ref 70–99)
Potassium: 4 mmol/L (ref 3.5–5.1)
Sodium: 136 mmol/L (ref 135–145)

## 2022-11-28 LAB — MAGNESIUM: Magnesium: 1.9 mg/dL (ref 1.7–2.4)

## 2022-11-28 MED ORDER — ZOLPIDEM TARTRATE 5 MG PO TABS
5.0000 mg | ORAL_TABLET | Freq: Every day | ORAL | Status: DC
  Administered 2022-11-28: 5 mg via ORAL
  Filled 2022-11-28: qty 1

## 2022-11-28 MED ORDER — ENOXAPARIN SODIUM 30 MG/0.3ML IJ SOSY
30.0000 mg | PREFILLED_SYRINGE | INTRAMUSCULAR | Status: DC
  Administered 2022-11-28: 30 mg via SUBCUTANEOUS
  Filled 2022-11-28: qty 0.3

## 2022-11-28 NOTE — Progress Notes (Signed)
[facility] Progress Note Patient: [name] [id number] DOB: Aug 19, 1951 DOA: 11/25/2022  DOS: the patient was seen and examined on 11/28/2022  Brief hospital course: PMH of CKD 3A, type II DM, migraine, present to the hospital with complaints of left-sided weakness and dizziness.  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  Neurology was consulted.  Stroke workup initiated. Assessment and Plan: Acute right MCA stroke. Possible small vessel disease. CT head without any acute abnormality. MRI confirms acute right posterior MCA infarct. MRA shows no large vessel occlusion. Echocardiogram shows preserved EF of 6065% without any valvular abnormality. Lower extremity Doppler negative for DVT. Carotid Doppler negative for any significant stenosis. PT OT as well as SLP evaluation recommending CIR. Currently on dysphagia 2 diet. Not on any thrombophlebitic medication prior to admission.  Currently on 81 mg aspirin and 75 mg Plavix daily.  Continue for 3 weeks and then aspirin alone. Patient will require loop recorder prior to discharge.  HTN. Blood pressure mildly elevated. At home on no medication. Allowing permissive hypertension. Monitor.  Migraine. On Imitrex at home currently on hold due to acute stroke. Continue as needed narcotic medication for pain control.  Nausea. Treated with Reglan.  Now resolved. Will monitor.  HLD. LDL 35. Continue statin on discharge and Crestor.  Type 2 diabetes mellitus, controlled without long-term insulin use with CKD. On metformin at home. Hemoglobin A1c currently pending. Continue sliding scale insulin.  CKD 3A. Baseline serum creatinine around 1.8 Currently serum creatinine in the same range. Will monitor.  Anemia likely severe chronic kidney disease. Hemoglobin remaining stable. Monitor for now.  Hallucinations/agitation. Wife reported that the last night the patient had some hallucination as well as agitation resolved on  its own without any intervention. Will try Ambien tonight and monitor.  Educated patient on importance of DVT prophylaxis.  Subjective: No headache.  No nausea no vomiting.  Some hallucinations reported by wife last night.  No fever no chills.  Physical Exam: Clear to auscultation. S1-S2 present. No edema. Dysarthria improving.  Dysphagia improving. Left-sided weakness improving.  No asterixis.  Data Reviewed: I have Reviewed nursing notes, Vitals, and Lab results. Reviewed CBC and BMP.  Ordered CBC and BMP.  Disposition: Status is: Inpatient Remains inpatient appropriate because: Need CIR.  Medically stable.  Will do a loop recorder once CIR is arranged.  enoxaparin (LOVENOX) injection 30 mg Start: 11/28/22 2200   Family Communication: Family at bedside Level of care: Telemetry Medical   Vitals:   11/28/22 0547 11/28/22 0808 11/28/22 1118 11/28/22 1512  BP: 132/78 (!) 118/99 135/71 130/84  Pulse:  63 (!) 56 (!) 56  Resp:  16 16 16   Temp:  98.3 F (36.8 C) 97.9 F (36.6 C) 98.1 F (36.7 C)  TempSrc:  Oral Oral Oral  SpO2:  96% 97% 97%  Weight:      Height:         Author: Berle Mull, MD 11/28/2022 6:56 PM  Please look on [email or web address] to find out who is on call.

## 2022-11-29 ENCOUNTER — Other Ambulatory Visit (HOSPITAL_COMMUNITY): Payer: Self-pay

## 2022-11-29 ENCOUNTER — Encounter (HOSPITAL_COMMUNITY)
Admission: EM | Disposition: A | Discharge status: Home / Self Care | Payer: Self-pay | Source: Home / Self Care | Attending: Internal Medicine

## 2022-11-29 DIAGNOSIS — I639 Cerebral infarction, unspecified: Secondary | ICD-10-CM | POA: Diagnosis not present

## 2022-11-29 HISTORY — PX: LOOP RECORDER INSERTION: EP1214

## 2022-11-29 LAB — CBC
HCT: 36.8 % — ABNORMAL LOW (ref 39.0–52.0)
Hemoglobin: 12.4 g/dL — ABNORMAL LOW (ref 13.0–17.0)
MCH: 30.6 pg (ref 26.0–34.0)
MCHC: 33.7 g/dL (ref 30.0–36.0)
MCV: 90.9 fL (ref 80.0–100.0)
Platelets: 271 10*3/uL (ref 150–400)
RBC: 4.05 MIL/uL — ABNORMAL LOW (ref 4.22–5.81)
RDW: 13.2 % (ref 11.5–15.5)
WBC: 5 10*3/uL (ref 4.0–10.5)
nRBC: 0 % (ref 0.0–0.2)

## 2022-11-29 LAB — BASIC METABOLIC PANEL
Anion gap: 9 (ref 5–15)
BUN: 14 mg/dL (ref 8–23)
CO2: 21 mmol/L — ABNORMAL LOW (ref 22–32)
Calcium: 9.4 mg/dL (ref 8.9–10.3)
Chloride: 107 mmol/L (ref 98–111)
Creatinine, Ser: 1.81 mg/dL — ABNORMAL HIGH (ref 0.61–1.24)
GFR, Estimated: 39 mL/min — ABNORMAL LOW (ref >60)
Glucose, Bld: 114 mg/dL — ABNORMAL HIGH (ref 70–99)
Potassium: 3.9 mmol/L (ref 3.5–5.1)
Sodium: 137 mmol/L (ref 135–145)

## 2022-11-29 LAB — MAGNESIUM: Magnesium: 1.7 mg/dL (ref 1.7–2.4)

## 2022-11-29 SURGERY — LOOP RECORDER INSERTION

## 2022-11-29 MED ORDER — CLOPIDOGREL BISULFATE 75 MG PO TABS
75.0000 mg | ORAL_TABLET | Freq: Every day | ORAL | 0 refills | Status: AC
  Filled 2022-11-29: qty 19, 19d supply, fill #0

## 2022-11-29 MED ORDER — ASPIRIN 81 MG PO TBEC
81.0000 mg | DELAYED_RELEASE_TABLET | Freq: Every day | ORAL | 12 refills | Status: AC
  Filled 2022-11-29: qty 30, 30d supply, fill #0

## 2022-11-29 MED ORDER — DOCUSATE SODIUM 100 MG PO CAPS
100.0000 mg | ORAL_CAPSULE | Freq: Two times a day (BID) | ORAL | 0 refills | Status: AC
  Filled 2022-11-29: qty 10, 5d supply, fill #0

## 2022-11-29 MED ORDER — ROSUVASTATIN CALCIUM 20 MG PO TABS
20.0000 mg | ORAL_TABLET | Freq: Every day | ORAL | 0 refills | Status: AC
  Filled 2022-11-29: qty 30, 30d supply, fill #0

## 2022-11-29 MED ORDER — LIDOCAINE-EPINEPHRINE 1 %-1:100000 IJ SOLN
INTRAMUSCULAR | Status: DC | PRN
  Administered 2022-11-29: 5 mL

## 2022-11-29 MED ORDER — LIDOCAINE-EPINEPHRINE 1 %-1:100000 IJ SOLN
INTRAMUSCULAR | Status: AC
  Filled 2022-11-29: qty 1

## 2022-11-29 MED ORDER — PANTOPRAZOLE SODIUM 40 MG PO TBEC
40.0000 mg | DELAYED_RELEASE_TABLET | Freq: Every day | ORAL | 0 refills | Status: AC
  Filled 2022-11-29: qty 30, 30d supply, fill #0

## 2022-11-29 SURGICAL SUPPLY — 2 items
PACK LOOP INSERTION (CUSTOM PROCEDURE TRAY) ×1 IMPLANT
SYSTEM MONITOR REVEAL LINQ II (Prosthesis & Implant Heart) IMPLANT

## 2022-11-29 NOTE — Progress Notes (Signed)
  EP is aware that pt will potentially need Loop recorder prior to discharge.   Please reach out on day of discharge for consultation.   Legrand Como [address] [facility], PA-C  11/29/2022 10:08 AM

## 2022-11-29 NOTE — Consult Note (Signed)
ELECTROPHYSIOLOGY CONSULT NOTE  Patient ID: [name] MRN: [id number], DOB/AGE: Oct 22, 1950   Admit date: 11/25/2022 Date of Consult: 11/29/2022  Primary Physician: Chesley Noon, MD Primary Cardiologist: None  Primary Electrophysiologist: New to None  Reason for Consultation: Cryptogenic stroke; recommendations regarding Implantable Loop Recorder Insurance: [facility] Medicare  History of Present Illness EP has been asked to evaluate Devoria Glassing for placement of an implantable loop recorder to monitor for atrial fibrillation by Dr Leonie Man.  The patient was admitted on 11/25/2022 with dizziness, N/V, left sided weakness, and HA.    Imaging demonstrated Acute right posterior MCA infarct, etiology unclear, concerning for embolic source .    He has undergone workup for stroke including:  CT head No acute abnormality.  Chronic microvascular ischemic changes MRI acute right posterior MCA ischemic infarct MRA no LVO Carotid Doppler unremarkable 2D Echo EF 60 to 65% LE venous Doppler no DVT Recommend loop recorder to rule out A-fib LDL 35 HgbA1c 6.7 UDS negative VTE prophylaxis -heparin subcu No antithrombotics prior to admission, now on aspirin 81 mg and 75 of Plavix daily for 3 weeks then aspirin alone Therapy recommendations: HHPT disposition: Home today    The patient has been monitored on telemetry which has demonstrated sinus rhythm with no arrhythmias.  Inpatient stroke work-up will not require a TEE per Neurology.   Echocardiogram as above. Lab work is reviewed.  Prior to admission, the patient denies chest pain, shortness of breath, dizziness, palpitations, or syncope.  He is recovering from his stroke with plans to return home  at discharge.  Allergies, Past Medical, Surgical, Social, and Family Histories have been reviewed and are referenced here-in when relevant for medical decision making.   Inpatient Medications:   aspirin EC  81 mg Oral Daily    clopidogrel  75 mg Oral Daily   docusate sodium  100 mg Oral BID   enoxaparin (LOVENOX) injection  30 mg Subcutaneous Q24H   polyethylene glycol  17 g Oral Daily   rosuvastatin  20 mg Oral Daily   zolpidem  5 mg Oral QHS    Physical Exam: Vitals:   11/29/22 0018 11/29/22 0454 11/29/22 0750 11/29/22 1114  BP: (!) 142/84 126/74 120/71 136/79  Pulse: (!) 55 72 (!) 59 63  Resp: 12 12 17 16   Temp: 97.6 F (36.4 C) (!) 97.5 F (36.4 C) 98.1 F (36.7 C) 98.1 F (36.7 C)  TempSrc:   Oral Oral  SpO2: 96% 95% 96% 95%  Weight:      Height:        GEN- NAD. A&O x 3. Normal affect. HEENT: Normocephalic, atraumatic Lungs- CTAB, Normal effort.  Heart- Regular rate and rhythm rate and rhythm. No M/G/R.  Extremities- No peripheral edema. no clubbing or cyanosis Skin- warm and dry, no rash or lesion. Neuro Mild left sided weakness persists   12-lead ECG on arrival showed NSR at 69 bpm (personally reviewed) All prior EKG's in EPIC reviewed with no documented atrial fibrillation  Telemetry SB/NSR 50-70s (personally reviewed)  Assessment and Plan:  1. Cryptogenic stroke The patient presents with cryptogenic stroke.  The patient does not have a TEE planned for this AM.  I spoke at length with the patient about monitoring for afib with an implantable loop recorder.  Risks, benefits, and alteratives to implantable loop recorder were discussed with the patient today.   At this time, the patient is very clear in their decision to proceed with implantable loop recorder.  Wound care was reviewed with the patient (keep incision clean and dry for 3 days). Please call with questions.   Shirley Friar, PA-C 11/29/2022 1:37 PM

## 2022-11-29 NOTE — Progress Notes (Signed)
Cath Lab called for a patient bleeding from the site of a Loop Recorder placement. Patient's site started to bleed while being transported to hospital exit after being discharged. Patient returned to in-patient room, [place] [facility].  On our arrival at patient's side he is sitting in a wheel chair. RN holding pressure at insertion site. Patient's sweat shirt has a blood stain with approximately 3 ml of blood loss. After exposing the insertion site, the steri strips were lose and laceration had dehiscence, bleeding was controlled.  Steri strips were replaced and incision site re-closed with pressure being held for approximately 5 minutes, no bleeding noted. Site dressed with gauze and tape. Patient is ok to continue being discharged  Patient educated to place pressure on the dressing/ site when moving around, and to call 911 if bleeding can not be stopped while at home.

## 2022-11-29 NOTE — TOC Transition Note (Signed)
Transition of Care [facility]) - CM/SW Discharge Note   Patient Details  Name: [name] MRN: [id number] Date of Birth: 11-21-1950  [facility] CM/SW Contact:  Pollie Friar, RN Phone Number: 11/29/2022, 2:28 PM   Clinical Narrative:    Pt is discharging home with spouse. Recommendations have changed to outpatient therapy. Wife and pt prefer to attend at [facility] outpatient. Referral sent and information on the AVS. No new DME needs.  Wife is able to provide needed transportation and will transport home today. Pt was managing his medications at home but wife will oversee his medications for a while after d/c.  Pharmacy of choice: [facility] in [place].   Final next level of care: OP Rehab Barriers to Discharge: No Barriers Identified   Patient Goals and CMS Choice   Choice offered to / list presented to : Patient, Spouse  Discharge Placement                         Discharge Plan and Services Additional resources added to the After Visit Summary for                                       Social Determinants of Health (SDOH) Interventions SDOH Screenings   Food Insecurity: No Food Insecurity (11/25/2022)  Housing: Low Risk  (11/25/2022)  Transportation Needs: No Transportation Needs (11/25/2022)  Utilities: Not At Risk (11/25/2022)  Tobacco Use: Low Risk  (11/25/2022)     Readmission Risk Interventions     No data to display

## 2022-11-29 NOTE — Progress Notes (Signed)
Inpatient Rehab Admissions Coordinator:    I met with pt. And wife to discuss potential CIR admit. They are interested, so I opened insurance case. I received a message later in the morning from PT that recs were updated to outpatient. Will see how he does with OT but will likely withdraw case and sign off.   Clemens Catholic, [place], [place] Admissions Coordinator  [phone] ([place]) [phone] (office)

## 2022-11-29 NOTE — Progress Notes (Signed)
Pt received from cath lab AxOx4, VS as per flow, no c/o pain. Dressing received with mild sanguinous soilage. Will continue to monitor and maintain safety. Spouse bedside.

## 2022-11-29 NOTE — Progress Notes (Signed)
Physical Therapy Treatment Patient Details Name: [name] MRN: [id number] DOB: 12/03/1950 Today's Date: 11/29/2022   History of Present Illness Pt is a 72 y.o. male who presented 11/25/22 with AMS, blurred vision, headache, and L-sided numbness and weakness. 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 PMH: DM, migraine    PT Comments    Pt making excellent progress towards his physical therapy goals this session, demonstrating improvement in cognition, balance, and gait. Pt denying dizziness. Ambulating hallway distances with no assistive device and negotiated 5 steps with railing. Demonstrates decreased gait speed and mild dynamic balance deficits. Would benefit from follow up OPPT to address and maximize functional independence. Pt spouse stating she is available for 24/7 assist.   Recommendations for follow up therapy are one component of a multi-disciplinary discharge planning process, led by the attending physician.  Recommendations may be updated based on patient status, additional functional criteria and insurance authorization.         Assistance Recommended at Discharge Frequent or constant Supervision/Assistance  Patient can return home with the following Assistance with cooking/housework;Direct supervision/assist for medications management;Direct supervision/assist for financial management;Assist for transportation;A little help with walking and/or transfers   Equipment Recommendations  None recommended by PT    Recommendations for Other Services       Precautions / Restrictions Precautions Precautions: Fall Precaution Comments: headache Restrictions Weight Bearing Restrictions: No     Mobility  Bed Mobility Overal bed mobility: Modified Independent                  Transfers Overall transfer level: Needs assistance Equipment used: Rolling walker (2 wheels) Transfers: Sit to/from Stand Sit to Stand:  Supervision                Ambulation/Gait Ambulation/Gait assistance: Supervision, Min guard Gait Distance (Feet): 300 Feet Assistive device: None Gait Pattern/deviations: Step-through pattern, Decreased stride length       General Gait Details: Slow and steady pace with decreased reciprocal arm swing, min guard initially but progressing to supervision.   Stairs Stairs: Yes Stairs assistance: Supervision Stair Management: Two rails Number of Stairs: 5     Wheelchair Mobility    Modified Rankin (Stroke Patients Only) Modified Rankin (Stroke Patients Only) Pre-Morbid Rankin Score: No symptoms Modified Rankin: Moderately severe disability     Balance Overall balance assessment: Needs assistance Sitting-balance support: No upper extremity supported Sitting balance-Leahy Scale: Good     Standing balance support: No upper extremity supported, During functional activity Standing balance-Leahy Scale: Good                              Cognition Arousal/Alertness: Awake/alert Behavior During Therapy: WFL for tasks assessed/performed Overall Cognitive Status: Impaired/Different from baseline Area of Impairment: Memory, Attention, Following commands, Safety/judgement, Awareness, Problem solving                   Current Attention Level: Selective   Following Commands: Follows multi-step commands consistently Safety/Judgement: Decreased awareness of deficits Awareness: Emergent Problem Solving: Requires verbal cues General Comments: Improvement in cognition, difficulty with problem solving tasks        Exercises      General Comments General comments (skin integrity, edema, etc.): performed standing visual standing tasks with no difficulty locating items on right      Pertinent Vitals/Pain Pain Assessment Pain Assessment: Faces Faces Pain Scale: No hurt    Home  Living                          Prior Function             PT Goals (current goals can now be found in the care plan section) Acute Rehab PT Goals Potential to Achieve Goals: Good Progress towards PT goals: Progressing toward goals    Frequency    Min 4X/week      PT Plan Discharge plan needs to be updated    Co-evaluation              AM-PAC PT "6 Clicks" Mobility   Outcome Measure  Help needed turning from your back to your side while in a flat bed without using bedrails?: None Help needed moving from lying on your back to sitting on the side of a flat bed without using bedrails?: None Help needed moving to and from a bed to a chair (including a wheelchair)?: A Little Help needed standing up from a chair using your arms (e.g., wheelchair or bedside chair)?: A Little Help needed to walk in hospital room?: A Little Help needed climbing 3-5 steps with a railing? : A Little 6 Click Score: 20    End of Session Equipment Utilized During Treatment: Gait belt Activity Tolerance: Patient tolerated treatment well Patient left: in bed;with call bell/phone within reach;with family/visitor present Nurse Communication: Mobility status PT Visit Diagnosis: Unsteadiness on feet (R26.81);Other abnormalities of gait and mobility (R26.89);Difficulty in walking, not elsewhere classified (R26.2);Dizziness and giddiness (R42);Other symptoms and signs involving the nervous system (R29.898)     Time: [id number] PT Time Calculation (min) (ACUTE ONLY): 28 min  Charges:  $Therapeutic Activity: 23-37 mins                     Wyona Almas, PT, DPT Acute Rehabilitation Services Office [place] 11/29/2022, 3:47 PM

## 2022-11-29 NOTE — Progress Notes (Signed)
Occupational Therapy Treatment Patient Details Name: [name] MRN: [id number] DOB: Nov 13, 1950 Today's Date: 11/29/2022   History of present illness Pt is a 72 y.o. male who presented 11/25/22 with AMS, blurred vision, headache, and L-sided numbness and weakness. 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 PMH: DM, migraine   OT comments  Patient with good gains this treatment session. Patient demonstrated gains with bed mobility, transfers, self care, and following directions. Patient continues to benefit from RW use for mobility and dynamic balance. Patient appears appropriate for discharge home with family support and continued OT treatment in outpatient therapy.    Recommendations for follow up therapy are one component of a multi-disciplinary discharge planning process, led by the attending physician.  Recommendations may be updated based on patient status, additional functional criteria and insurance authorization.    Assistance Recommended at Discharge Intermittent Supervision/Assistance  Patient can return home with the following  A little help with walking and/or transfers;Assistance with cooking/housework;Direct supervision/assist for medications management;Direct supervision/assist for financial management;Assist for transportation;Help with stairs or ramp for entrance;A little help with bathing/dressing/bathroom   Equipment Recommendations  Other (comment) (defer to next venue)    Recommendations for Other Services      Precautions / Restrictions Precautions Precautions: Fall Precaution Comments: headache Restrictions Weight Bearing Restrictions: No       Mobility Bed Mobility Overal bed mobility: Needs Assistance Bed Mobility: Supine to Sit     Supine to sit: Supervision, HOB elevated     General bed mobility comments: no physical assistance needed    Transfers Overall transfer level: Needs assistance Equipment  used: Rolling walker (2 wheels) Transfers: Sit to/from Stand, Bed to chair/wheelchair/BSC Sit to Stand: Supervision, Min guard           General transfer comment: attempted to transfer without RW with unsafe balance, supervision to min guard with  RW use     Balance Overall balance assessment: Needs assistance Sitting-balance support: No upper extremity supported Sitting balance-Leahy Scale: Fair Sitting balance - Comments: able to sit on EOB without assistance   Standing balance support: Single extremity supported, Bilateral upper extremity supported, No upper extremity supported, During functional activity Standing balance-Leahy Scale: Fair Standing balance comment: able to stand at sink for grooming tasks with no UE support                           ADL either performed or assessed with clinical judgement   ADL Overall ADL's : Needs assistance/impaired     Grooming: Wash/dry hands;Wash/dry face;Oral care;Supervision/safety;Standing Grooming Details (indicate cue type and reason): at sink             Lower Body Dressing: Supervision/safety;Sitting/lateral leans Lower Body Dressing Details (indicate cue type and reason): changed socks               General ADL Comments: able to locate all grooming items on sink placed on right side    Extremity/Trunk Assessment              Vision   Additional Comments: able to locate items on right   Perception     Praxis      Cognition Arousal/Alertness: Awake/alert Behavior During Therapy: WFL for tasks assessed/performed Overall Cognitive Status: Impaired/Different from baseline Area of Impairment: Orientation, Memory, Attention, Following commands, Safety/judgement, Awareness, Problem solving  Orientation Level: Time (able to recall date and day with increased time) Current Attention Level: Focused   Following Commands: Follows one step commands consistently, Follows  multi-step commands consistently Safety/Judgement: Decreased awareness of safety, Decreased awareness of deficits Awareness: Emergent Problem Solving: Slow processing General Comments: improvement with following directions and cognition        Exercises      Shoulder Instructions       General Comments performed standing visual standing tasks with no difficulty locating items on right    Pertinent Vitals/ Pain       Pain Assessment Pain Assessment: Faces Faces Pain Scale: Hurts little more Pain Location: head Pain Descriptors / Indicators: Grimacing, Headache, Sharp Pain Intervention(s): Monitored during session, Premedicated before session  Home Living                                          Prior Functioning/Environment              Frequency  Min 2X/week        Progress Toward Goals  OT Goals(current goals can now be found in the care plan section)  Progress towards OT goals: Progressing toward goals  Acute Rehab OT Goals Patient Stated Goal: go home OT Goal Formulation: With patient/family Time For Goal Achievement: 12/11/22 Potential to Achieve Goals: Good ADL Goals Pt Will Perform Lower Body Bathing: with min guard assist;sit to/from stand;sitting/lateral leans Pt Will Perform Lower Body Dressing: with min guard assist;sitting/lateral leans;sit to/from stand Pt Will Transfer to Toilet: with min guard assist;ambulating;regular height toilet Pt Will Perform Toileting - Clothing Manipulation and hygiene: with supervision;sitting/lateral leans;sit to/from stand Additional ADL Goal #1: Patient will be able to locate items on R side consistently with use of visual cues as well as complete functional tasks without inattention to R side independently. Additional ADL Goal #2: Patient will be able to follow 2-3 step commands or engage in upper level cognitive tasks without need for cues or external assist for recall.  Plan Discharge plan  remains appropriate    Co-evaluation                 AM-PAC OT "6 Clicks" Daily Activity     Outcome Measure   Help from another person eating meals?: None Help from another person taking care of personal grooming?: None Help from another person toileting, which includes using toliet, bedpan, or urinal?: A Little Help from another person bathing (including washing, rinsing, drying)?: A Little Help from another person to put on and taking off regular upper body clothing?: A Little Help from another person to put on and taking off regular lower body clothing?: A Little 6 Click Score: 20    End of Session Equipment Utilized During Treatment: Gait belt;Rolling walker (2 wheels)  OT Visit Diagnosis: Unsteadiness on feet (R26.81);Other abnormalities of gait and mobility (R26.89);Muscle weakness (generalized) (M62.81);Other symptoms and signs involving the nervous system (R29.898);Other symptoms and signs involving cognitive function;Dizziness and giddiness (R42)   Activity Tolerance Patient tolerated treatment well   Patient Left in bed;with call bell/phone within reach;with family/visitor present   Nurse Communication Mobility status        Time: [id number] OT Time Calculation (min): 30 min  Charges: OT General Charges $OT Visit: 1 Visit OT Treatments $Self Care/Home Management : 8-22 mins $Therapeutic Activity: 8-22 mins  Lodema Hong, OTA Acute Rehabilitation Services  Office [phone]   Trixie Dredge 11/29/2022, 2:16 PM

## 2022-11-29 NOTE — Progress Notes (Signed)
While accompanying pt to ride with Georgina Quint, RN, post discharge, pt loop recorder site started bleeding. Zahra held pressure while I grabbed gauze and tape a brought it to put on pt. We notified primary RN who got in touch with cath lab team. Once bleeding subsided we wheeled pt back to 3W. Currently, cath lab team at bedside assessing and redressing pt surgical wound site.  Cath lab team put steri strip and redressed the site with gauze and tape. They said he is good to go home. Educated pt on what to do if site bleeds again.

## 2022-11-29 NOTE — Progress Notes (Signed)
Pt safely discharged. AVS instructions provided to Pt and spouse using teach-back method. All questions and concerns addressed. IV and telemetry removed. TOC medications provided. During transport with SW[employer] nurses, it was noted that Pt's loop recorder site started bleeding and pressure was applied. Cath lab called and assessed. Pressure held and bleeding stopped; Ok to proceed with discharge as planned.

## 2022-11-29 NOTE — Care Management Important Message (Signed)
Important Message  Patient Details  Name: [name] MRN: [id number] Date of Birth: February 24, 1951   Medicare Important Message Given:  Yes     [name] 11/29/2022, 3:27 PM

## 2022-11-30 ENCOUNTER — Encounter (HOSPITAL_COMMUNITY): Payer: Self-pay | Admitting: Cardiovascular Disease

## 2022-11-30 ENCOUNTER — Telehealth: Payer: Self-pay

## 2022-11-30 DIAGNOSIS — E785 Hyperlipidemia, unspecified: Secondary | ICD-10-CM | POA: Diagnosis present

## 2022-11-30 DIAGNOSIS — I10 Essential (primary) hypertension: Secondary | ICD-10-CM | POA: Diagnosis present

## 2022-11-30 DIAGNOSIS — N1831 Chronic kidney disease, stage 3a: Secondary | ICD-10-CM | POA: Diagnosis present

## 2022-11-30 DIAGNOSIS — G43909 Migraine, unspecified, not intractable, without status migrainosus: Secondary | ICD-10-CM | POA: Diagnosis present

## 2022-11-30 DIAGNOSIS — E119 Type 2 diabetes mellitus without complications: Secondary | ICD-10-CM

## 2022-11-30 NOTE — Telephone Encounter (Signed)
-----   Message from Shirley Friar, [place] sent at 11/29/2022  3:05 PM EDT ----- Regarding: Same Day Discharge LOOP 11/29/22 Dr. Myles Gip

## 2022-11-30 NOTE — Telephone Encounter (Signed)
I spoke with the patient son and had him unplug the monitor and plug it back in. I told him to call us tomorrow to make sure the monitor is working.

## 2022-11-30 NOTE — Discharge Summary (Signed)
Physician Discharge Summary   Patient: [name] MRN: [id number] DOB: 1950-11-15  Admit date:     11/25/2022  Discharge date: 11/29/2022  Discharge Physician: Berle Mull  PCP: Chesley Noon, MD  Recommendations at discharge: Follow-up with PCP in 1 week. Follow-up with neurology as recommended.   Follow-up Information     Chesley Noon, MD. Schedule an appointment as soon as possible for a visit in 2 week(s).   Specialty: Family Medicine Contact information: [place] [phone] [facility]. Schedule an appointment as soon as possible for a visit in 1 month(s).   Specialty: Neurology Contact information: [address] [place]. Schedule an appointment as soon as possible for a visit in 1 week(s).   Specialty: Rehabilitation Contact information: [place] [address] [place], [place] [id number] [place] [phone]               Discharge Diagnoses: Principal Problem:   Acute CVA (cerebrovascular accident) [facility]) Active Problems:   Essential hypertension   Migraine   Chronic kidney disease, stage 3a ([place])   Controlled type 2 diabetes mellitus without complication, without long-term current use of insulin ([place])   HLD (hyperlipidemia)  Hospital Course: PMH of CKD 3A, type II DM, migraine, present to the hospital with complaints of left-sided weakness and dizziness.  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  Neurology was consulted.  Stroke workup initiated. Assessment and Plan  Acute right MCA stroke. Possible small vessel disease. CT head without any acute abnormality. MRI confirms acute right posterior MCA infarct. MRA shows no large vessel occlusion. Echocardiogram shows preserved EF of 6065% without any valvular abnormality. Lower  extremity Doppler negative for DVT. Carotid Doppler negative for any significant stenosis. PT OT as well as SLP evaluation recommending CIR. on repeat evaluation patient improved significantly and therefore therapy recommended possibility of going home.  Patient will continue with outpatient neurorehab for SLP. Currently on dysphagia 2 diet. Not on any thrombophlebitic medication prior to admission.  Currently on 81 mg aspirin and 75 mg Plavix daily.  Continue for 3 weeks and then aspirin alone. Patient will require loop recorder prior to discharge.   HTN. Blood pressure mildly elevated. At home on no medication. Monitor.   Migraine. On Imitrex at home currently on hold due to acute stroke. Continue as needed narcotic medication for pain control.   Nausea. Treated with Reglan.  Now resolved. Will monitor.   HLD. LDL 35. Continue statin on discharge and Crestor.   Type 2 diabetes mellitus, controlled without long-term insulin use with CKD. On metformin at home. Hemoglobin A1c currently pending. Continue sliding scale insulin.   CKD 3A. Baseline serum creatinine around 1.8 Currently serum creatinine in the same range. Will monitor.   Anemia likely severe chronic kidney disease. Hemoglobin remaining stable. Monitor for now.   Hallucinations/agitation. Wife reported that the last night the patient had some hallucination as well as agitation resolved on its own without any intervention. Resolved with Ambien and sleep.  Consultants:  Neurology  Procedures performed:  Echocardiogram  DISCHARGE MEDICATION: Allergies as of 11/29/2022       Reactions   Atorvastatin Calcium Other (See Comments)   Lisinopril Swelling   Bee Venom Swelling   Swelling  Swelling   Codeine Itching  Medication List     STOP taking these medications    amoxicillin-clavulanate 875-125 MG tablet Commonly known as: AUGMENTIN   esomeprazole 40 MG capsule Commonly known as:  NEXIUM       TAKE these medications    acetaminophen 325 MG tablet Commonly known as: TYLENOL Take 650 mg by mouth every 6 (six) hours as needed for moderate pain or mild pain.   Aspirin Low Dose 81 MG tablet Generic drug: aspirin EC Take 1 tablet (81 mg total) by mouth daily. Swallow whole.   clopidogrel 75 MG tablet Commonly known as: PLAVIX Take 1 tablet (75 mg total) by mouth daily for 19 days.   dicyclomine 20 MG tablet Commonly known as: BENTYL Take 20 mg by mouth 3 (three) times daily before meals.   docusate sodium 100 MG capsule Commonly known as: COLACE Take 1 capsule (100 mg total) by mouth 2 (two) times daily.   fluticasone 50 MCG/ACT nasal spray Commonly known as: FLONASE Place 2 sprays into both nostrils 2 (two) times daily.   gabapentin 300 MG capsule Commonly known as: NEURONTIN Take 300 mg by mouth 3 (three) times daily.   metFORMIN 1000 MG tablet Commonly known as: GLUCOPHAGE Take 1,000 mg by mouth 2 (two) times daily with a meal.   ondansetron 4 MG disintegrating tablet Commonly known as: Zofran ODT Take 1 tablet (4 mg total) by mouth every 8 (eight) hours as needed for nausea or vomiting.   pantoprazole 40 MG tablet Commonly known as: Protonix Take 1 tablet (40 mg total) by mouth daily.   rosuvastatin 20 MG tablet Commonly known as: CRESTOR Take 1 tablet (20 mg total) by mouth daily. What changed:  medication strength how much to take   SUMAtriptan 100 MG tablet Commonly known as: IMITREX Take 1 tablet by mouth daily as needed for headache or migraine.   tamsulosin 0.4 MG Caps capsule Commonly known as: FLOMAX Take by mouth daily.       Disposition: Home Diet recommendation: Cardiac diet  Discharge Exam: Vitals:   11/29/22 0454 11/29/22 0750 11/29/22 1114 11/29/22 1522  BP: 126/74 120/71 136/79 (!) 154/74  Pulse: 72 (!) 59 63 65  Resp: 12 17 16 16   Temp: (!) 97.5 F (36.4 C) 98.1 F (36.7 C) 98.1 F (36.7 C) 97.8 F  (36.6 C)  TempSrc:  Oral Oral Oral  SpO2: 95% 96% 95% 98%  Weight:      Height:       General: Appear in mild distress; no visible Abnormal Neck Mass Or lumps, Conjunctiva normal Cardiovascular: S1 and S2 Present, no Murmur, Respiratory: good respiratory effort, Bilateral Air entry present and CTA, no Crackles, no wheezes Abdomen: Bowel Sound present, Non tender  Extremities: no Pedal edema Neurology: alert and oriented to time, place, and person mild dysarthria. Filed Weights   11/27/22 0500 11/28/22 0500  Weight: 79.5 kg 71.2 kg   Condition at discharge: stable  The results of significant diagnostics from this hospitalization (including imaging, microbiology, ancillary and laboratory) are listed below for reference.   Imaging Studies: EP PPM/ICD IMPLANT  Result Date: 11/29/2022 SURGEON:  Melida Quitter, MD   PREPROCEDURE DIAGNOSIS:  Cryptogenic stroke   POSTPROCEDURE DIAGNOSIS:  Cryptogenic stroke    PROCEDURES:  1. Implantable loop recorder implantation   INTRODUCTION:  VERLAND FJERSTAD  is a 72 y.o. patient with a history of cryptogenic stroke. Inpatient telemetry has been reviewed and not shown atrial fibrillation. The patient therefore presents today for implantable  loop implantation. The costs of loop recorder monitoring have been discussed with the patient.   DESCRIPTION OF PROCEDURE:  Informed written consent was obtained.  A timeout was performed. The patient required no sedation for the procedure today.  The patients left chest was prepped and draped in the usual sterile fashion. The skin overlying the left parasternal region was infiltrated with lidocaine for local analgesia.  A 0.5-cm incision was made over the left parasternal region over the 3rd intercostal space.  A Medtronic implantable loop recorder was then placed into the pocket  R waves were very prominent and measured >0.36mV.  Steri- Strips and a sterile dressing were then applied.  There were no early apparent  complications.   CONCLUSIONS:  1. Successful implantation of a Medtronic implantable loop recorder for a history of cryptogenic stroke  2. No early apparent complications. Melida Quitter, MD Cardiac Electrophysiology   VAS [place] LOWER EXTREMITY VENOUS (DVT)  Result Date: 11/27/2022  Lower Venous DVT Study Patient Name:  DEMONTAY BUFALINI  Date of Exam:   11/26/2022 Medical Rec #: [id number]         Accession #:    [id number] Date of Birth: 1951/02/15         Patient Gender: M Patient Age:   76 years Exam Location:  [facility] Procedure:      VAS [place] LOWER EXTREMITY VENOUS (DVT) Referring Phys: Cornelius Moras XU --------------------------------------------------------------------------------  Indications: Embolic stroke.  Comparison Study: No prior study. Performing Technologist: McKayla Maag RVT, [place]  Examination Guidelines: A complete evaluation includes B-mode imaging, spectral Doppler, color Doppler, and power Doppler as needed of all accessible portions of each vessel. Bilateral testing is considered an integral part of a complete examination. Limited examinations for reoccurring indications may be performed as noted. The reflux portion of the exam is performed with the patient in reverse Trendelenburg.  +---------+---------------+---------+-----------+----------+--------------+ RIGHT    CompressibilityPhasicitySpontaneityPropertiesThrombus Aging +---------+---------------+---------+-----------+----------+--------------+ CFV      Full           Yes      Yes                                 +---------+---------------+---------+-----------+----------+--------------+ SFJ      Full                                                        +---------+---------------+---------+-----------+----------+--------------+ FV Prox  Full                                                        +---------+---------------+---------+-----------+----------+--------------+ FV Mid   Full                                                         +---------+---------------+---------+-----------+----------+--------------+ FV DistalFull                                                        +---------+---------------+---------+-----------+----------+--------------+  PFV      Full                                                        +---------+---------------+---------+-----------+----------+--------------+ POP      Full           Yes      Yes                                 +---------+---------------+---------+-----------+----------+--------------+ PTV      Full                                                        +---------+---------------+---------+-----------+----------+--------------+ PERO     Full                                                        +---------+---------------+---------+-----------+----------+--------------+   +---------+---------------+---------+-----------+----------+--------------+ LEFT     CompressibilityPhasicitySpontaneityPropertiesThrombus Aging +---------+---------------+---------+-----------+----------+--------------+ CFV      Full           Yes      Yes                                 +---------+---------------+---------+-----------+----------+--------------+ SFJ      Full                                                        +---------+---------------+---------+-----------+----------+--------------+ FV Prox  Full                                                        +---------+---------------+---------+-----------+----------+--------------+ FV Mid   Full                                                        +---------+---------------+---------+-----------+----------+--------------+ FV DistalFull                                                        +---------+---------------+---------+-----------+----------+--------------+ PFV      Full                                                         +---------+---------------+---------+-----------+----------+--------------+  POP      Full           Yes      Yes                                 +---------+---------------+---------+-----------+----------+--------------+ PTV      Full                                                        +---------+---------------+---------+-----------+----------+--------------+ PERO     Full                                                        +---------+---------------+---------+-----------+----------+--------------+     Summary: BILATERAL: - No evidence of deep vein thrombosis seen in the lower extremities, bilaterally. - No evidence of superficial venous thrombosis in the lower extremities, bilaterally. -No evidence of popliteal cyst, bilaterally.   *See table(s) above for measurements and observations. Electronically signed by Orlie Pollen on 11/27/2022 at 10:56:01 AM.    Final    VAS US CAROTID  Result Date: 11/27/2022 Carotid Arterial Duplex Study Patient Name:  KISHAWN HUTCHERSON  Date of Exam:   11/26/2022 Medical Rec #: [id number]         Accession #:    [id number] Date of Birth: 1951/01/22         Patient Gender: M Patient Age:   84 years Exam [facility]:  [facility] Procedure:      VAS US CAROTID Referring Phys: Irene Pap --------------------------------------------------------------------------------  Indications:       CVA, Numbness and Weakness. Risk Factors:      Diabetes. Comparison Study:  No prior study. Performing Technologist: McKayla Maag RVT, [place]  Examination Guidelines: A complete evaluation includes B-mode imaging, spectral Doppler, color Doppler, and power Doppler as needed of all accessible portions of each vessel. Bilateral testing is considered an integral part of a complete examination. Limited examinations for reoccurring indications may be performed as noted.  Right Carotid Findings: +----------+--------+--------+--------+------------------+--------+           PSV  cm/sEDV cm/sStenosisPlaque DescriptionComments +----------+--------+--------+--------+------------------+--------+ CCA Prox  124     23                                         +----------+--------+--------+--------+------------------+--------+ CCA Distal85      22                                         +----------+--------+--------+--------+------------------+--------+ ICA Prox  87      15      1-39%                              +----------+--------+--------+--------+------------------+--------+ ICA Mid   100     35  tortuous +----------+--------+--------+--------+------------------+--------+ ICA Distal82      22                                tortuous +----------+--------+--------+--------+------------------+--------+ ECA       72      24                                         +----------+--------+--------+--------+------------------+--------+ +----------+--------+-------+----------------+-------------------+           PSV cm/sEDV cmsDescribe        Arm Pressure (mmHG) +----------+--------+-------+----------------+-------------------+ [id number]            Multiphasic, WNL                    +----------+--------+-------+----------------+-------------------+ +---------+--------+--+--------+-+---------+ VertebralPSV cm/s25EDV cm/s8Antegrade +---------+--------+--+--------+-+---------+  Left Carotid Findings: +----------+--------+--------+--------+------------------+--------+           PSV cm/sEDV cm/sStenosisPlaque DescriptionComments +----------+--------+--------+--------+------------------+--------+ CCA Prox  130     19                                         +----------+--------+--------+--------+------------------+--------+ CCA Distal68      10                                         +----------+--------+--------+--------+------------------+--------+ ICA Prox  58      15      1-39%                               +----------+--------+--------+--------+------------------+--------+ ICA Mid   76      23                                         +----------+--------+--------+--------+------------------+--------+ ICA Distal76      20                                         +----------+--------+--------+--------+------------------+--------+ ECA       106     12                                         +----------+--------+--------+--------+------------------+--------+ +----------+--------+--------+----------------+-------------------+           PSV cm/sEDV cm/sDescribe        Arm Pressure (mmHG) +----------+--------+--------+----------------+-------------------+ Subclavian180             Multiphasic, WNL                    +----------+--------+--------+----------------+-------------------+ +---------+--------+--+--------+--+---------+ VertebralPSV cm/s45EDV cm/s15Antegrade +---------+--------+--+--------+--+---------+   Summary: Right Carotid: Velocities in the right ICA are consistent with a 1-39% stenosis. Left Carotid: Velocities in the left ICA are consistent with a 1-39% stenosis.               Overall, elevated velocities. Vertebrals:  Bilateral vertebral arteries demonstrate  antegrade flow. Subclavians: Normal flow hemodynamics were seen in bilateral subclavian              arteries. *See table(s) above for measurements and observations.  Electronically signed by Orlie Pollen on 11/27/2022 at 10:55:51 AM.    Final    ECHOCARDIOGRAM COMPLETE BUBBLE STUDY  Result Date: 11/26/2022    ECHOCARDIOGRAM REPORT   Patient Name:   [name] Date of Exam: 11/26/2022 Medical Rec #:  [id number]        Height:       67.0 in Accession #:    [id number]       Weight:       160.0 lb Date of Birth:  [birth year]        BSA:          1.839 m Patient Age:    42 years         BP:           131/71 mmHg Patient Gender: M                HR:           78 bpm. Exam Location:   Inpatient Procedure: 2D Echo, Cardiac Doppler and Color Doppler Indications:   Stroke  History:       Patient has no prior history of Echocardiogram examinations.                Stroke.  Sonographer:   Marella Chimes Referring      [id number] Kayleen Memos Phys:  Sonographer Comments: Image acquisition challenging due to uncooperative patient. Patient is unable to lay still for exam. IMPRESSIONS  1. Limited study to 35 images - exam stopped due to patient non-compliance  2. Left ventricular ejection fraction, by estimation, is 60 to 65%. The left ventricle has normal function. The left ventricle has no regional wall motion abnormalities. Left ventricular diastolic parameters are consistent with Grade I diastolic dysfunction (impaired relaxation).  3. The aortic valve is tricuspid. Aortic valve regurgitation is not visualized.  4. The mitral valve is grossly normal. No evidence of mitral valve regurgitation.  5. Right ventricular systolic function was not well visualized. The right ventricular size is not well visualized. FINDINGS  Left Ventricle: Left ventricular ejection fraction, by estimation, is 60 to 65%. The left ventricle has normal function. The left ventricle has no regional wall motion abnormalities. The left ventricular internal cavity size was normal in size. There is  no left ventricular hypertrophy. Left ventricular diastolic parameters are consistent with Grade I diastolic dysfunction (impaired relaxation). Right Ventricle: The right ventricular size is not well visualized. Right vetricular wall thickness was not assessed. Right ventricular systolic function was not well visualized. Left Atrium: Left atrial size was normal in size. Right Atrium: Right atrial size was normal in size. Pericardium: There is no evidence of pericardial effusion. Mitral Valve: The mitral valve is grossly normal. No evidence of mitral valve regurgitation. Tricuspid Valve: The tricuspid valve is not assessed. Tricuspid valve  regurgitation is not demonstrated. Aortic Valve: The aortic valve is tricuspid. Aortic valve regurgitation is not visualized. Pulmonic Valve: The pulmonic valve was not assessed. Pulmonic valve regurgitation is not visualized. Aorta: The aortic root and ascending aorta are structurally normal, with no evidence of dilitation. IAS/Shunts: No atrial level shunt detected by color flow Doppler.  LEFT VENTRICLE PLAX 2D LVIDd:         3.90 cm LVIDs:  2.70 cm LV PW:         1.00 cm LV IVS:        0.90 cm LVOT diam:     2.40 cm LVOT Area:     4.52 cm  LEFT ATRIUM           Index LA diam:      3.80 cm 2.07 cm/m LA Vol (A4C): 43.2 ml 23.49 ml/m   AORTA Ao Root diam: 3.60 cm Ao Asc diam:  3.60 cm MITRAL VALVE MV Area (PHT): 3.08 cm    SHUNTS MV Decel Time: 246 msec    Systemic Diam: 2.40 cm MV E velocity: 61.70 cm/s MV A velocity: 65.60 cm/s MV E/A ratio:  0.94 Lyman Bishop MD Electronically signed by Lyman Bishop MD Signature Date/Time: 11/26/2022/3:00:17 PM    Final    MR ANGIO HEAD WO CONTRAST  Result Date: 11/25/2022 CLINICAL DATA:  Follow-up examination for stroke. EXAM: MRA HEAD WITHOUT CONTRAST TECHNIQUE: Angiographic images of the Circle of Willis were acquired using MRA technique without intravenous contrast. COMPARISON:  Prior brain MRI from earlier the same day. FINDINGS: Anterior circulation: Both ICAs are somewhat dolichoectatic in appearance. Minor atheromatous irregularity about the carotid siphons without stenosis. A1 segments, anterior communicating artery complex common anterior cerebral arteries patent without stenosis. No M1 stenosis or occlusion. No proximal MCA branch occlusion or high-grade stenosis. Distal MCA branches perfused and symmetric. Posterior circulation: Vertebrobasilar system is dolichoectatic in appearance. Both V4 segments widely patent, with the left being dominant. Both PICA patent. Basilar widely patent without stenosis. Superior cerebellar and posterior cerebral arteries  patent bilaterally. Anatomic variants: None significant. Other: No intracranial aneurysm. IMPRESSION: 1. Negative intracranial MRA for large vessel occlusion or other emergent finding. No hemodynamically significant or correctable stenosis. 2. Dolichoectatic appearance of the intracranial arterial circulation, suggesting chronic underlying hypertension. Electronically Signed   By: Jeannine Boga M.D.   On: 11/25/2022 22:27   MR BRAIN WO CONTRAST  Result Date: 11/25/2022 CLINICAL DATA:  Initial evaluation for neuro deficit, stroke suspected. EXAM: MRI HEAD WITHOUT CONTRAST TECHNIQUE: Multiplanar, multiecho pulse sequences of the brain and surrounding structures were obtained without intravenous contrast. COMPARISON:  Prior CT from earlier the same day. FINDINGS: Brain: Examination is truncated due to the patient's inability to tolerate the full length of the exam. Additionally, provided images are degraded by motion. Mild age-related cerebral atrophy. Patchy T2/FLAIR hyperintensity involving the periventricular deep white matter both cerebral hemispheres, consistent with chronic small vessel ischemic disease, moderate in nature. 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 No associated mass effect. Evaluation for associated hemorrhage limited given lack of gradient echo sequence, however, no hemorrhage seen within this region on prior CT. No mass lesion, midline shift, or mass effect. Mild ventricular prominence related to global parenchymal volume loss of hydrocephalus. No extra-axial fluid collection. Pituitary gland and suprasellar region within normal limits. Vascular: Major intracranial vascular flow voids are maintained. Intracranial vasculature is dolichoectatic in appearance. Skull and upper cervical spine: Craniocervical junction within normal limits. Bone marrow  signal intensity normal. No scalp soft tissue abnormality. Sinuses/Orbits: Prior bilateral ocular lens replacement. Scattered mucosal thickening present about the sphenoid ethmoidal sinuses. Paranasal sinuses are otherwise clear. No mastoid effusion. Other: None. IMPRESSION: 1. 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 2. Underlying mild age-related cerebral atrophy with moderate chronic microvascular ischemic disease. Electronically Signed  By: Jeannine Boga M.D.   On: 11/25/2022 19:47   CT HEAD WO CONTRAST  Result Date: 11/25/2022 CLINICAL DATA:  Neuro deficit, acute, stroke suspected EXAM: CT HEAD WITHOUT CONTRAST TECHNIQUE: Contiguous axial images were obtained from the base of the skull through the vertex without intravenous contrast. RADIATION DOSE REDUCTION: This exam was performed according to the departmental dose-optimization program which includes automated exposure control, adjustment of the mA and/or kV according to patient size and/or use of iterative reconstruction technique. COMPARISON:  CT head 05/16/2009. FINDINGS: Motion limited study. Brain: No evidence of acute large vascular territory infarction, hemorrhage, hydrocephalus, extra-axial collection or mass lesion/mass effect. Patchy white matter hypodensities, nonspecific but compatible with chronic microvascular ischemic change. Cerebral atrophy. Vascular: Calcific atherosclerosis.  No visible hyperdense vessel. Skull: No acute fracture. Sinuses/Orbits: Clear sinuses.  No acute orbital findings. Other: No mastoid effusions. IMPRESSION: 1. Motion limited study without evidence of acute large vascular territory infarct or acute hemorrhage. 2. Chronic microvascular ischemic change. MRI could provide more sensitive evaluation for white matter infarct if clinically warranted. Electronically Signed   By: Margaretha Sheffield M.D.   On: 11/25/2022 13:01    Microbiology: Results for orders placed or performed  during the hospital encounter of 11/25/22  Resp panel by RT-PCR (RSV, Flu A&B, Covid) Anterior Nasal Swab     Status: None   Collection Time: 11/25/22 11:45 AM   Specimen: Anterior Nasal Swab  Result Value Ref Range Status   SARS Coronavirus 2 by RT PCR NEGATIVE NEGATIVE Final   Influenza A by PCR NEGATIVE NEGATIVE Final   Influenza B by PCR NEGATIVE NEGATIVE Final    Comment: (NOTE) The Xpert Xpress SARS-CoV-2/FLU/RSV plus assay is intended as an aid in the diagnosis of influenza from Nasopharyngeal swab specimens and should not be used as a sole basis for treatment. Nasal washings and aspirates are unacceptable for Xpert Xpress SARS-CoV-2/FLU/RSV testing.  Fact Sheet for Patients: EntrepreneurPulse.com.au  Fact Sheet for Healthcare Providers: IncredibleEmployment.be  This test is not yet approved or cleared by the [place] FDA and has been authorized for detection and/or diagnosis of SARS-CoV-2 by FDA under an Emergency Use Authorization (EUA). This EUA will remain in effect (meaning this test can be used) for the duration of the COVID-19 declaration under Section 564(b)(1) of the Act, 21 U.S.C. section 360bbb-3(b)(1), unless the authorization is terminated or revoked.     Resp Syncytial Virus by PCR NEGATIVE NEGATIVE Final    Comment: (NOTE) Fact Sheet for Patients: EntrepreneurPulse.com.au  Fact Sheet for Healthcare Providers: IncredibleEmployment.be  This test is not yet approved or cleared by the [place] FDA and has been authorized for detection and/or diagnosis of SARS-CoV-2 by FDA under an Emergency Use Authorization (EUA). This EUA will remain in effect (meaning this test can be used) for the duration of the COVID-19 declaration under Section 564(b)(1) of the Act, 21 U.S.C. section 360bbb-3(b)(1), unless the authorization is terminated or revoked.  Performed at [facility], [place] [address], [place], [place] [address]    Labs: CBC: Recent Labs  Lab 11/25/22 1130 11/25/22 1231 11/25/22 2233 11/26/22 1122 11/27/22 0150 11/28/22 0434 11/29/22 0734  WBC 6.4  --  7.5 6.2 5.6 4.5 5.0  NEUTROABS 4.8  --   --  4.6  --   --   --   HGB 12.0*   < > 11.0* 10.7* 10.5* 10.2* 12.4*  HCT 37.7*   < > 32.9* 31.9* 31.3* 30.8* 36.8*  MCV 95.2  --  91.4 90.9 92.3 92.5 90.9  PLT 268  --  249 233 237 223 271   < > = values in this interval not displayed.   Basic Metabolic Panel: Recent Labs  Lab 11/25/22 1130 11/25/22 1231 11/25/22 2233 11/26/22 1122 11/27/22 0150 11/28/22 0434 11/29/22 0734  NA 137 138  --  136 137 136 137  K 4.7 4.4  --  4.1 4.1 4.0 3.9  CL 103 101  --  107 107 108 107  CO2 25  --   --  19* 23 21* 21*  GLUCOSE 148* 141*  --  119* 89 91 114*  BUN 10 12  --  16 16 16 14   CREATININE 1.81* 1.80* 1.85* 1.66* 1.88* 1.63* 1.81*  CALCIUM 9.2  --   --  8.3* 8.6* 8.9 9.4  MG  --   --   --  1.5* 2.4 1.9 1.7   Liver Function Tests: Recent Labs  Lab 11/25/22 1130  AST 21  ALT 14  ALKPHOS 52  BILITOT 1.2  PROT 6.9  ALBUMIN 3.8   CBG: Recent Labs  Lab 11/25/22 1128  GLUCAP 139*    Discharge time spent: greater than 30 minutes.  Signed: Berle Mull, MD [facility] 11/29/2022

## 2022-11-30 NOTE — Telephone Encounter (Signed)
Unable to reach Pt on number listed.  Outreach made to Pt's son.  He gave nurse correct home number, added to Pt's chart.  He is going to Pt's house and will call this nurse upon arrival to assist with connecting remotely.   Loop Recorder Follow up   Is patient connected to [facility]? No   Have steri-strips fallen off or been removed?   Does the patient need in office follow up? No   Please continue to monitor your cardiac device site for redness, swelling, and drainage. Call the device clinic at [phone] if you experience these symptoms, fever/chills, or have questions about your device.   Remote monitoring is used to monitor your cardiac device from home. This monitoring is scheduled every month by our office. It allows [place] to keep an eye on the functioning of your device to ensure it is working properly.

## 2022-12-01 NOTE — Telephone Encounter (Signed)
Monitor is connected 12/01/22.

## 2022-12-09 ENCOUNTER — Ambulatory Visit: Payer: Medicare Other | Admitting: Physical Therapy

## 2022-12-09 ENCOUNTER — Ambulatory Visit: Payer: Medicare Other | Attending: Internal Medicine | Admitting: Occupational Therapy

## 2022-12-09 ENCOUNTER — Ambulatory Visit: Payer: Medicare Other

## 2022-12-09 ENCOUNTER — Encounter: Payer: Self-pay | Admitting: Occupational Therapy

## 2022-12-09 ENCOUNTER — Encounter: Payer: Self-pay | Admitting: Physical Therapy

## 2022-12-09 ENCOUNTER — Other Ambulatory Visit: Payer: Self-pay

## 2022-12-09 DIAGNOSIS — M6281 Muscle weakness (generalized): Secondary | ICD-10-CM | POA: Diagnosis present

## 2022-12-09 DIAGNOSIS — R471 Dysarthria and anarthria: Secondary | ICD-10-CM | POA: Diagnosis present

## 2022-12-09 DIAGNOSIS — R1310 Dysphagia, unspecified: Secondary | ICD-10-CM

## 2022-12-09 DIAGNOSIS — R41842 Visuospatial deficit: Secondary | ICD-10-CM | POA: Diagnosis present

## 2022-12-09 DIAGNOSIS — R2689 Other abnormalities of gait and mobility: Secondary | ICD-10-CM | POA: Diagnosis present

## 2022-12-09 DIAGNOSIS — R278 Other lack of coordination: Secondary | ICD-10-CM | POA: Insufficient documentation

## 2022-12-09 DIAGNOSIS — R41840 Attention and concentration deficit: Secondary | ICD-10-CM | POA: Diagnosis not present

## 2022-12-09 DIAGNOSIS — R42 Dizziness and giddiness: Secondary | ICD-10-CM | POA: Diagnosis present

## 2022-12-09 DIAGNOSIS — R2681 Unsteadiness on feet: Secondary | ICD-10-CM | POA: Diagnosis present

## 2022-12-09 DIAGNOSIS — R208 Other disturbances of skin sensation: Secondary | ICD-10-CM | POA: Insufficient documentation

## 2022-12-09 DIAGNOSIS — R41841 Cognitive communication deficit: Secondary | ICD-10-CM | POA: Diagnosis present

## 2022-12-09 DIAGNOSIS — I639 Cerebral infarction, unspecified: Secondary | ICD-10-CM | POA: Diagnosis not present

## 2022-12-09 NOTE — Therapy (Signed)
OUTPATIENT PHYSICAL THERAPY NEURO EVALUATION   [name] MRN: [id number] DOB:04/08/1951, 72 y.o., male Today's Date: 12/10/2022   PCP: [name], MD REFERRING PROVIDER: [name], MD >to send to PCP, [name]  END OF SESSION:  PT End of Session - 12/10/22 0730     Visit Number 1    Number of Visits 17    Date for PT Re-Evaluation 02/04/23    Authorization Type [employer] Medicare    Progress Note Due on Visit 10    PT Start Time 1448    PT Stop Time 1531    PT Time Calculation (min) 43 min    Equipment Utilized During Treatment Gait belt    Activity Tolerance Patient tolerated treatment well    Behavior During Therapy WFL for tasks assessed/performed             Past Medical History:  Diagnosis Date   Diabetes mellitus without complication    Migraine    Past Surgical History:  Procedure Laterality Date   BACK SURGERY     GIVENS CAPSULE STUDY N/A 05/05/2015   Procedure: GIVENS CAPSULE STUDY;  Surgeon: Charna Elizabeth, MD;  Location: [facility] ENDOSCOPY;  Service: Endoscopy;  Laterality: N/A;   LOOP RECORDER INSERTION N/A 11/29/2022   Procedure: LOOP RECORDER INSERTION;  Surgeon: Maurice Small, MD;  Location: [facility] INVASIVE CV LAB;  Service: Cardiovascular;  Laterality: N/A;   Patient Active Problem List   Diagnosis Date Noted   Essential hypertension 11/30/2022   Migraine 11/30/2022   Chronic kidney disease, stage 3a 11/30/2022   Controlled type 2 diabetes mellitus without complication, without long-term current use of insulin 11/30/2022   HLD (hyperlipidemia) 11/30/2022   Acute CVA (cerebrovascular accident) 11/25/2022    ONSET DATE: 11/25/2022  REFERRING DIAG: I63.9 (ICD-10-CM) - Cerebrovascular accident (CVA), unspecified mechanism   THERAPY DIAG:  Muscle weakness (generalized)  Unsteadiness on feet  Other abnormalities of gait and mobility  Dizziness and giddiness  Rationale for Evaluation and Treatment:  Rehabilitation  SUBJECTIVE:                                                                                                                                                                                             SUBJECTIVE STATEMENT: Prior to CVA, was independent.  Had symptoms of headache and L sided weakness.  Wife reports that he is very unsteady, almost leans backward and to the side with turns.  She wants him to keep using the walker. Pt accompanied by: significant other-wife, [name]  PERTINENT HISTORY: a-fib, DM, hx of migraines, hx of back surgery, multiple knee surgeries (  per pt report)  PAIN:  Are you having pain? Yes: NPRS scale: 4/10 Pain location: headache Pain description: headache Aggravating factors: sunlight, noise Relieving factors: darkness, quietness  PRECAUTIONS: Fall and Other: loop recorder; requires supervision at all times  WEIGHT BEARING RESTRICTIONS: No  FALLS: Has patient fallen in last 6 months? Yes. Number of falls 1 with his initial CVA  LIVING ENVIRONMENT: Lives with: lives with their spouse Lives in: House/apartment Stairs: Yes: External: 3 steps; on right going up.  Home has basement, but he doesn't go down into basement. Has following equipment at home: Single point cane, Walker - 2 wheeled, and bed side commode  PLOF: Independent.  [place] and works in the yard, mowing.  Enjoys seeing grandson play baseball.  PATIENT GOALS: To get back to normal  OBJECTIVE:   DIAGNOSTIC FINDINGS: 1. 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 2. Underlying mild age-related cerebral atrophy with moderate chronic microvascular ischemic disease.  COGNITION: Overall cognitive status: Impaired   SENSATION: Light touch: WFL and reports sometimes having tingling on left foot  COORDINATION: Decreased LLE  MUSCLE TONE: WFL BLEs  VITALS:  137/85, HR 71  POSTURE: rounded shoulders and posterior pelvic tilt  LOWER  EXTREMITY ROM:   in sitting, Active ROM WFL BLEs   LOWER EXTREMITY MMT:    MMT Right Eval Left Eval  Hip flexion 4+ 4  Hip extension    Hip abduction    Hip adduction    Hip internal rotation    Hip external rotation    Knee flexion 4 3+  Knee extension 4 3+  Ankle dorsiflexion 4 3+  Ankle plantarflexion    Ankle inversion    Ankle eversion    (Blank rows = not tested)   TRANSFERS: Assistive device utilized: None  Sit to stand: CGA Stand to sit: CGA Slowed, ataxic movements with transfers GAIT: Gait pattern: step through pattern, shuffling, ataxic, and wide BOS; veers to L Distance walked: 60 ft Assistive device utilized: Environmental consultant - 2 wheeled Level of assistance: CGA Comments: Tends to vent to L  FUNCTIONAL TESTS:  5 times sit to stand: 25.34 sec without UE support 10 meter walk test: NT Berg Balance Scale: 22/56  [facility] PT Assessment - 12/10/22 0736       Standardized Balance Assessment   Standardized Balance Assessment Berg Balance Test      Berg Balance Test   Sit to Stand Able to stand  independently using hands    Standing Unsupported Able to stand 2 minutes with supervision    Sitting with Back Unsupported but Feet Supported on Floor or Stool Able to sit safely and securely 2 minutes    Stand to Sit Controls descent by using hands    Transfers Able to transfer safely, definite need of hands    Standing Unsupported with Eyes Closed Able to stand 3 seconds    Standing Unsupported with Feet Together Needs help to attain position but able to stand for 30 seconds with feet together    From Standing, Reach Forward with Outstretched Arm Loses balance while trying/requires external support    From Standing Position, Pick up Object from Floor Unable to try/needs assist to keep balance    From Standing Position, Turn to Look Behind Over each Shoulder Needs supervision when turning    Turn 360 Degrees Needs assistance while turning    Standing Unsupported, Alternately  Place Feet on Step/Stool Needs assistance to keep from falling or  unable to try    Standing Unsupported, One Foot in Front Needs help to step but can hold 15 seconds    Standing on One Leg Tries to lift leg/unable to hold 3 seconds but remains standing independently    Total Score 22    Berg comment: Scores <45/56 indicate increased fall risk             PATIENT SURVEYS:  FOTO Intake score 50; predicted 69  TODAY'S TREATMENT:                                                                                                                              DATE: 12/09/2022    PATIENT EDUCATION: Education details: Eval results, POC, recommendation to continue to use walker due to high fall risk Person educated: Patient and Spouse Education method: Explanation Education comprehension: verbalized understanding  HOME EXERCISE PROGRAM: Not yet initiated  GOALS: Goals reviewed with patient? Yes  SHORT TERM GOALS: Target date: 01/07/2023  Pt will be supervision with HEP for improved balance, strength, gait. Baseline: Goal status: INITIAL  2.  Pt will improve 5x sit<>stand to less than or equal to 20 sec to demonstrate improved functional strength and transfer efficiency.  Baseline: 25.34 sec Goal status: INITIAL  3.  Pt will improve Berg score to at least 30/56 to decrease fall risk. Baseline: 22/56 Goal status: INITIAL  4.  Further vestibular testing to be completed and goal set as appropriate. Baseline:  Goal status: INITIAL   LONG TERM GOALS: Target date: 02/04/2023  Pt will be supervision with HEP for improved strength, balance, gait. Baseline:  Goal status: INITIAL  2.  Pt will improve 5x sit<>stand to less than or equal to 15 sec to demonstrate improved functional strength and transfer efficiency. Baseline: 25.34 sec Goal status: INITIAL  3.  Pt will improve Berg score to at least 40/56 to decrease fall risk. Baseline: 22/56 Goal status: INITIAL  4.  FOTO score to  improve to 69 to demo improved overall functional mobility. Baseline: 50 Goal status: INITIAL  5.  Further vestibular testing to be completed/goal written as appropriate. Baseline:  Goal status: INITIAL  6.  Pt will ambulate at least 1000 ft indoor and outdoor surfaces, with appropriate assistive device, mod I, for outdoor and community gait. Baseline:  Goal status: INITIAL  ASSESSMENT:  CLINICAL IMPRESSION: Patient is a 72 y.o. male who was seen today for physical therapy evaluation and treatment for CVA.  Wife reports unsteadiness and occasional dizziness (per OT, after PT eval, they reported episode of dizziness causing nausea while walking).  Pt presents to OPPT with decreased strength, decreased balance, abnormal posture, decreased timing and coordination of gait, decreased independence with transfers and gait, likely vestibular component for balance changes.  He was active prior to CVA, going to baseball games for grandson and mowing large yard and farm.  He will benefit from skilled PT to address the above stated deficits to  decrease fall risk and improve overall functional mobility and return to independence.   OBJECTIVE IMPAIRMENTS: Abnormal gait, decreased balance, decreased knowledge of use of DME, decreased mobility, difficulty walking, decreased ROM, decreased strength, decreased safety awareness, dizziness, and postural dysfunction.   ACTIVITY LIMITATIONS: carrying, lifting, bending, standing, squatting, transfers, bathing, toileting, dressing, and locomotion level  PARTICIPATION LIMITATIONS: meal prep, cleaning, laundry, community activity, and yard work  PERSONAL FACTORS: 3+ comorbidities: see PMH; also potential visual/ vestibular component to pt's balance that needs to be further assessed  are also affecting patient's functional outcome.   REHAB POTENTIAL: Good  CLINICAL DECISION MAKING: Evolving/moderate complexity  EVALUATION COMPLEXITY: Moderate  PLAN:  PT  FREQUENCY: 2x/week  PT DURATION: 8 weeks plus eval  PLANNED INTERVENTIONS: Therapeutic exercises, Therapeutic activity, Neuromuscular re-education, Balance training, Gait training, Patient/Family education, Self Care, Vestibular training, Canalith repositioning, and DME instructions  PLAN FOR NEXT SESSION: Visual and vestibular testing-check for BPPV and vestibular ocular testing.  Initiate HEP for strength, balance   Korvin Valentine W., PT 12/10/2022, 7:31 AM  [facility] [address], [address] [place], [place] [address] Phone # [phone] Fax # [phone]

## 2022-12-09 NOTE — Therapy (Signed)
OUTPATIENT OCCUPATIONAL THERAPY NEURO EVALUATION  [name] MRN: [id number] DOB:May 02, 1951, 72 y.o., male Today's Date: 12/09/2022  PCP: Anastasia Pall REFERRING PROVIDER: Berle Mull   END OF SESSION:  OT End of Session - 12/09/22 1659     Visit Number 1    Number of Visits 17    Date for OT Re-Evaluation 02/07/23    Authorization Type [employer]    Progress Note Due on Visit 10    OT Start Time 1530    OT Stop Time 1615    OT Time Calculation (min) 45 min    Behavior During Therapy WFL for tasks assessed/performed             Past Medical History:  Diagnosis Date   Diabetes mellitus without complication    Migraine    Past Surgical History:  Procedure Laterality Date   BACK SURGERY     GIVENS CAPSULE STUDY N/A 05/05/2015   Procedure: GIVENS CAPSULE STUDY;  Surgeon: Juanita Craver, MD;  Location: [facility];  Service: Endoscopy;  Laterality: N/A;   LOOP RECORDER INSERTION N/A 11/29/2022   Procedure: LOOP RECORDER INSERTION;  Surgeon: Melida Quitter, MD;  Location: [facility];  Service: Cardiovascular;  Laterality: N/A;   Patient Active Problem List   Diagnosis Date Noted   Essential hypertension 11/30/2022   Migraine 11/30/2022   Chronic kidney disease, stage 3a 11/30/2022   Controlled type 2 diabetes mellitus without complication, without long-term current use of insulin 11/30/2022   HLD (hyperlipidemia) 11/30/2022   Acute CVA (cerebrovascular accident) 11/25/2022    ONSET DATE: 11/25/22  REFERRING DIAG: R MCA CVA  THERAPY DIAG:  Attention and concentration deficit  Visuospatial deficit  Other lack of coordination  Other disturbances of skin sensation  Rationale for Evaluation and Treatment: Rehabilitation  SUBJECTIVE:   SUBJECTIVE STATEMENT: Patient indicates headache, dizziness, and diplopia Pt accompanied by: significant other  PERTINENT HISTORY: DM II, Neuropathy, CKD, Migraine  PRECAUTIONS: Fall  WEIGHT  BEARING RESTRICTIONS: No  PAIN:  Are you having pain? Yes: NPRS scale: 5/10 Pain location: headache Aggravating factors: fatigue Relieving factors: tylenol  FALLS: Has patient fallen in last 6 months? Yes. Number of falls 1  LIVING ENVIRONMENT: Lives with: lives with their spouse Lives in: House/apartment Has following equipment at home:  Has tons of adaptive equipment from prior family members, uses shower seat, walker, has a cane, handicapped height toilet  PLOF: Independent with basic ADLs  PATIENT GOALS: feel like myself again  OBJECTIVE:   HAND DOMINANCE: Right  ADLs: Overall ADLs: supervision Transfers/ambulation related to ADLs: walks with supervision to min assist with RW Eating: Independent Grooming: supervision UB Dressing: independent LB Dressing: supervision Toileting: supervision Bathing: supervision Tub Shower transfers: close supervision Equipment: Shower seat with back and handicapped height toilet  IADLs: Shopping: NT Light housekeeping: Not participating at this time Meal Prep: Not participating at this time Community mobility: Not driving Medication management: wife Administrator, sports: wife manages Handwriting:  NT  MOBILITY STATUS: difficulty carrying objections with ambulation  POSTURE COMMENTS:  No Significant postural limitations   ACTIVITY TOLERANCE: Activity tolerance: Patient has difficulty with fatigue - reports not sleeping well at night, feels tired during the day  FUNCTIONAL OUTCOME MEASURES: FOTO: 63.75  UPPER EXTREMITY ROM:    Active ROM Right eval Left eval  Shoulder flexion [facility]  Shoulder abduction THRUOUT THRUOUT  Shoulder adduction    Shoulder extension    Shoulder internal rotation  Shoulder external rotation    Elbow flexion    Elbow extension    Wrist flexion    Wrist extension    Wrist ulnar deviation    Wrist radial deviation    Wrist pronation    Wrist supination    (Blank rows = not  tested)  UPPER EXTREMITY MMT:     MMT Right eval Left eval  Shoulder flexion 4/5 4+/5  Shoulder abduction 4+/5 THRUOUT  Shoulder adduction THRUOUT   Shoulder extension    Shoulder internal rotation    Shoulder external rotation    Middle trapezius    Lower trapezius    Elbow flexion    Elbow extension    Wrist flexion    Wrist extension    Wrist ulnar deviation    Wrist radial deviation    Wrist pronation    Wrist supination    (Blank rows = not tested)  HAND FUNCTION: Grip strength: Right: 80 lbs; Left: 76 lbs and Lateral pinch: Right: 20 lbs, Left: 20 lbs  COORDINATION: Finger Nose Finger test: undershooting consistently with LUE 9 Hole Peg test: Right: 29.68 sec; Left: 36.25 sec  SENSATION: Light touch: Impaired  Stereognosis: Not tested Proprioception: WFL  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 Wife reports he was initially very confused - even hallucinating.  Improving but she reports he often is off topic in conversation  VISION: Subjective report: reports change in peripheral vision Baseline vision: Wears glasses for reading only Visual history: cataracts, retinal issues in left eye  VISION ASSESSMENT: Impaired Eye alignment: Impaired: mild Ocular ROM: impaired Tracking/Visual pursuits: Decreased smoothness with horizontal tracking and Decreased smoothness with vertical tracking Saccades: undershoots and decreased speed of saccadic movements Convergence: Impaired:   Visual Fields: Left visual field deficits Diplopia assessment: present in Left gaze needs further testing - seems more apparent vertically.  Need to differentiate between gaze stabilization and diploia  Patient has difficulty with following activities due to following visual  impairments: reading, navigating environment  PERCEPTION: Impaired: Spatial orientation: posterior bias  PRAXIS: WFL     TODAY'S TREATMENT:                                                                                                                              DATE:  12/09/22: Discussed possible referral to neuro-optometry if visual motor impairments persist.      PATIENT EDUCATION: Education details: Potential goals and additional resources Person educated: Patient and Spouse Education method: Explanation Education comprehension: needs further education  HOME EXERCISE PROGRAM: TBD   GOALS: Goals reviewed with patient? No  SHORT TERM GOALS: Target date: 01/07/23  Patient will complete home exercise program designed to improve coordination in left UE Goal status: INITIAL  2.  Patient will complete an  HEP designed to improve visual motor skills  Goal status: INITIAL  3.  Patient will demonstrate improved awareness of visual management strategies for diplopia, and gaze stabilization.  Goal status: INITIAL  4.  Patient will complete a shower with distant supervision  Goal status: INITIAL  5.  Patient will reach to floor to obtain lightweight object to put on overhead shelf without loss of balance.    Goal status: INITIAL   LONG TERM GOALS: Target date: 02/07/23  Patient will return to simple cooking - hot meal for he and his wife with intermittent min assistance and supervision  Goal status: INITIAL  2.  Patient will read news article in increased font (14+) x 5 min with 100% comprehension   Goal status: INITIAL  3.  Patient will demonstrate awareness of return to driving recommendations  Goal status: INITIAL  4.  Patient will report effective simple hand tool use for simple household repairs  Goal status: INITIAL  5.  Patient will demonstrate at least 3 second improvement in 9 hole peg test LUE  Goal status: INITIAL  6.  Patient will safely return to  simple outside maintenance - gardening, mowing with supervision  Goal status: INITIAL  ASSESSMENT:  CLINICAL IMPRESSION: Patient is a 72 y.o. male who was seen today for occupational therapy evaluation for R MCA CVA 3 weeks ago.  Patient's wife reporting significant improvement in cognitive ability and reduced restlessness over the past two weeks.    PERFORMANCE DEFICITS: in functional skills including ADLs, IADLs, coordination, sensation, strength, pain, Fine motor control, Gross motor control, mobility, balance, endurance, decreased knowledge of use of DME, vision, and UE functional use, cognitive skills including attention, energy/drive, memory, problem solving, safety awareness, and sequencing, and psychosocial skills including habits and routines and behaviors.   IMPAIRMENTS: are limiting patient from ADLs, IADLs, and leisure.   CO-MORBIDITIES: may have co-morbidities  that affects occupational performance. Patient will benefit from skilled OT to address above impairments and improve overall function.  MODIFICATION OR ASSISTANCE TO COMPLETE EVALUATION: Min-Moderate modification of tasks or assist with assess necessary to complete an evaluation.  OT OCCUPATIONAL PROFILE AND HISTORY: Detailed assessment: Review of records and additional review of physical, cognitive, psychosocial history related to current functional performance.  CLINICAL DECISION MAKING: Moderate - several treatment options, min-mod task modification necessary  REHAB POTENTIAL: Good  EVALUATION COMPLEXITY: Moderate    PLAN:  OT FREQUENCY: 2x/week  OT DURATION: 8 weeks  PLANNED INTERVENTIONS: self care/ADL training, therapeutic exercise, therapeutic activity, neuromuscular re-education, balance training, functional mobility training, patient/family education, cognitive remediation/compensation, visual/perceptual remediation/compensation, and DME and/or AE instructions  RECOMMENDED OTHER SERVICES: Possibly  Neuro-optometry  CONSULTED AND AGREED WITH PLAN OF CARE: Patient and family member/caregiver  PLAN FOR NEXT SESSION: Need further visual motor assessment.  He reports at end of eval that he has significant dizziness to the point of vomiting.  Has had one fall in reaching to obtain item from floor.  Needs stand balance training - dynamic.  Needs env't scanning, and gaze stabilization/ eye teaming activity   Mariah Milling, OT 12/09/2022, 4:59 PM

## 2022-12-09 NOTE — Patient Instructions (Signed)
    Purpose: To improve lip and tongue strength in order to help people understand you. These exercises can be done while looking in a mirror.  Do them twice a day.  Lips  1. Press your lips together hard for 5 seconds and then release. Do this 15x  2. Press hard and briefly hold all the beginning sounds in these words: Repeat each set 2 times     "ma ma ma"    "boo boo boo"  "pa pa pa."          "[place]" "bye bye bye"  "pie pie pie"          "me me me"  "bee bee bee"  "pea pea pea"   3. Pucker your lips tightly and say "OOOO," then smile wide and say "EEEE."  Repeat _2_ times.  4 Practice whistling for __30__ seconds.  5 "Blow out" candles.  Repeat _10___ times.  6. Blow kisses - make them LOUD!  Repeat __10__ times.   Tip of Tongue  1. Say the sound "ta ta ta," "la la la,"   and "Engineering geologist."  Repeat each__2__ times.              "tee tee tee" "lee lee lee"   "dee dee dee"        "too too too" "[place]"  "do do do"  2. Say "time," "took," "take," "town," and "Tom."  Repeat __2__ times.  3. Say "long," "look," "low," "lay" and "lie."  Repeat _2__ times.  4. Say "name," "neck," "not," "new," and "no."  Repeat __2_ times.  5. Say "down," "dip," "dot," "Don," and "do."  Repeat __2__ times   Back of Tongue  1. Say the sounds "ka ka ka" and "go go go."   Repeat ___2_ times.       "cow cow cow" "guy guy guy"       "koo koo koo" "goo goo goo"  2. Say "kick," "cake," "Anda Kraft," "key," "keep," "kite," and "Ken."  Repeat __2__ times.  3. Say "gate," "gag," "got," "get," "gone," and "go."  Repeat __2__ times.

## 2022-12-09 NOTE — Therapy (Signed)
OUTPATIENT SPEECH LANGUAGE PATHOLOGY EVALUATION   [name] MRN: [id number] DOB:06-24-1951, 72 y.o., male Today's Date: 12/10/2022  PCP: Sean HasteBadger, [name], MD REFERRING PROVIDER: [name], [name], MD  Sean Haste(Badger, [name] MD - doc)  END OF SESSION:  End of Session - 12/10/22 0825     Visit Number 1    Number of Visits 25    Date for SLP Re-Evaluation 03/09/23    SLP Start Time 1617    SLP Stop Time  1702    SLP Time Calculation (min) 45 min    Activity Tolerance Patient tolerated treatment well;Patient limited by pain   7/10 headache            Past Medical History:  Diagnosis Date   Diabetes mellitus without complication    Migraine    Past Surgical History:  Procedure Laterality Date   BACK SURGERY     GIVENS CAPSULE STUDY N/A 05/05/2015   Procedure: GIVENS CAPSULE STUDY;  Surgeon: Sean ElizabethJyothi Mann, MD;  [facility];  Service: Endoscopy;  Laterality: N/A;   LOOP RECORDER INSERTION N/A 11/29/2022   Procedure: LOOP RECORDER INSERTION;  Surgeon: Sean SmallMealor, Augustus E, MD;  Location: [facility];  Service: Cardiovascular;  Laterality: N/A;   Patient Active Problem List   Diagnosis Date Noted   Essential hypertension 11/30/2022   Migraine 11/30/2022   Chronic kidney disease, stage 3a 11/30/2022   Controlled type 2 diabetes mellitus without complication, without long-term current use of insulin 11/30/2022   HLD (hyperlipidemia) 11/30/2022   Acute CVA (cerebrovascular accident) 11/25/2022    ONSET DATE: 11/25/22   REFERRING DIAG: CVA  THERAPY DIAG:  Dysarthria and anarthria  Cognitive communication deficit  Rationale for Evaluation and Treatment: Rehabilitation  SUBJECTIVE:   SUBJECTIVE STATEMENT: "She's handling my medication now." Pt accompanied by: family member wife  PERTINENT HISTORY: presented 11/25/22 with AMS, blurred vision, headache, and L-sided numbness and weakness. MRI revealed patchy small volume acute ischemic posterior  right MCA territory infarct. PMH: DM, migraine  PAIN:  Are you having pain? Yes: NPRS scale: 7/10 Pain location: head Pain description: headache Aggravating factors: rest Relieving factors: activity  FALLS: Has patient fallen in last 6 months?  See PT evaluation for details  LIVING ENVIRONMENT: Lives with: lives with their spouse Lives in: House/apartment  PLOF:  Level of assistance: Independent with ADLs, Independent with IADLs Employment: Retired  PATIENT GOALS: Improve communication skills  OBJECTIVE:   DIAGNOSTIC FINDINGS:  BSE 11/26/22 Clinical Impression   Pt reports no swallowing difficulties PTA. He has L side neglect that affects his ability to attend to POs presented on that side and appropriately raise them to his mouth. Oral motor exam revealed L side asymmetry and mild difficulty coodinating some movements. Pt exhibited difficulty forming a labial seal given thin liquids via straw and purees via spoon as well as oral holding during trials of solid textures. He had no observed s/s of aspiration throughout PO trials. Due to pt's awareness deficits and overall mentation, recommend Dys 2 (finely chopped) and thin liquids with full supervision. SLP will f/u as able to address potential for trials of upgraded textures as mentation improves.    COGNITION: Overall cognitive status: Impaired Areas of impairment:  Attention: Impaired: Sustained, Selective, Alternating, Divided Memory: Impaired: Short term Executive function: Impaired: Slow processing Functional deficits: Pt states he is talking and then loses train of thought often. He is no longer managing his medication (see "S" statement) nor his appointments. "If I had  a calendar (in the kitchen) it would help." Formal cognitive assessment may be warranted in the next few sessions.   AUDITORY COMPREHENSION: Overall auditory comprehension: Impaired: inasmuch as attention plays a role  YES/NO questions: Appears  intact Following directions:  affected inasmuch as attention is affected Conversation: Complex and Moderately Complex pt with slower response time than expected Interfering components: attention and pain(?) Effective technique: extra processing time  READING COMPREHENSION: Impaired: sentence and paragraph; Reports that he needs "extra concentration" to read anything - deficit likely due to attention  EXPRESSION: verbal  VERBAL EXPRESSION: Level of generative/spontaneous verbalization: conversation Repetition: Appears intact Naming: Confrontation: [place] Naming Test-2: 51/60 (WNL) Pragmatics: Appears intact Comments: Pt reports "tip of the tongue" syndrome occasionally. SLP believes this is due to cognitive deficits as BNT-2 score was above the mean and within WNL. Anomia was not seen today in conversation during evaluation. Interfering components: attention  WRITTEN EXPRESSION: Dominant hand: right Written expression: Not tested  MOTOR SPEECH: Overall motor speech: Articulation was intact during evaluation, however low vocal intensity was observed - unsure if this is due to pain (HA) or muscle weakness Level of impairment: Sentence and Conversation Respiration: diaphragmatic/abdominal breathing Phonation: normal and low vocal intensity (sub 70dB) Resonance: WFL Articulation: Appears intact currently however wife and pt state pt "mumbles" when fatigued Intelligibility: Intelligible Motor planning: Appears intact  ORAL MOTOR EXAMINATION: Overall status: Impaired:   Labial: Left (Strength) Lingual: Left (ROM, Strength, and Coordination) Velum: ROM   CLINICAL SWALLOW ASSESSMENT:   Current diet: regular and thin liquids Dentition: adequate natural dentition Patient directly observed with POs: Yes: thin liquids  Feeding: able to feed self Liquids provided by: cup Oral phase signs and symptoms:  none noted - no lt labial leakage Pharyngeal phase signs and symptoms:  none noted  during BSE however pt states he coughs with liquids occasionally at home Comments: Pt and wife to track coughing episodes at home and bring to SLP to assess possible need for referral for MBS.   STANDARDIZED ASSESSMENTS: [place] NAMING TEST: -2: see results above. Pt was above the mean/WNL  PATIENT REPORTED OUTCOME MEASURES (PROM): Cognitive Function:   and Communication Effectiveness Survey: to be provided by SLP first therapy session   TODAY'S TREATMENT:                                                                                                                                         DATE:  12/09/22 (eval): SLP went through portion of pt's HEP for oral strengthening. Full program will need to be completed with pt next time.  PATIENT EDUCATION: Education details: Eval results, HEP for lingual weakness and labial weakness Person educated: Patient and Spouse Education method: Explanation, Demonstration, Verbal cues, and Handouts Education comprehension: verbalized understanding, returned demonstration, verbal cues required, and needs further education   GOALS: Goals reviewed with patient? Yes  SHORT TERM GOALS: Target date:  01/16/23  Pt will report improved communication (speech intelligibility) after dinner between 3 sessions Baseline: Goal status: INITIAL  2.  Pt will bring cough log to two of first 4 sessions; pt will be referred for MBS if clinically warranted Baseline:  Goal status: INITIAL  3.  Pt will undergo cognitive assessment in first 3 sessions Baseline:  Goal status: INITIAL  4.  Pt speech volume in 5 minutes simple conversation will improve to WNL in 3 sessions Baseline:  Goal status: INITIAL   LONG TERM GOALS: Target date: 03/09/23  Pt's PROM measures will increase compared to first measurement during first therapy session Baseline:  Goal status: INITIAL  2.  Pt speech volume in 10 minutes mod complex conversation will improve to WNL in 3  sessions Baseline:  Goal status: INITIAL  3.  Pt will report improved communication (speech intelligibility) after dinner between 3 sessions (after 01/16/23) Baseline:  Goal status: INITIAL  4.  TBD Baseline:  Goal status: INITIAL  5.  TBD Baseline:  Goal status: INITIAL   ASSESSMENT:  CLINICAL IMPRESSION: Patient is a 72 y.o. male who was seen today for speech intelligibility and cognitive communication abilities following a CVA 11-25-22. He may require a MBS; Pt to bring cough log back so SLP can assess this need due to WNL swallowing during a BSE today. Lastly, pt will require a cognitive communication assessment - reporting that memory appears weaker than before 11-25-22. He reported anomia in conversation however SLP did not  appreciate anomia today during evaluation and pt scored WNL on [place] Naming Test - anomic-like incidents may be due to reduced processing ability.  OBJECTIVE IMPAIRMENTS: include attention, memory, expressive language, receptive language, and dysarthria. These impairments are limiting patient from managing medications, managing appointments, household responsibilities, ADLs/IADLs, effectively communicating at home and in community, and safety when swallowing. Factors affecting potential to achieve goals and functional outcome are  none noted today . Patient will benefit from skilled SLP services to address above impairments and improve overall function.  REHAB POTENTIAL: Good  PLAN:  SLP FREQUENCY: 2x/week  SLP DURATION: 12 weeks  PLANNED INTERVENTIONS: Environmental controls, Cueing hierachy, Cognitive reorganization, Internal/external aids, Oral motor exercises, Functional tasks, SLP instruction and feedback, Compensatory strategies, Patient/family education, and MBS    [name], CCC-SLP 12/10/2022, 8:26 AM

## 2022-12-15 ENCOUNTER — Ambulatory Visit: Payer: Medicare Other | Admitting: Physical Therapy

## 2022-12-15 ENCOUNTER — Ambulatory Visit: Payer: Medicare Other

## 2022-12-15 ENCOUNTER — Ambulatory Visit: Payer: Medicare Other | Admitting: Occupational Therapy

## 2022-12-15 ENCOUNTER — Encounter: Payer: Self-pay | Admitting: Physical Therapy

## 2022-12-15 DIAGNOSIS — R41840 Attention and concentration deficit: Secondary | ICD-10-CM | POA: Diagnosis not present

## 2022-12-15 DIAGNOSIS — R41841 Cognitive communication deficit: Secondary | ICD-10-CM

## 2022-12-15 DIAGNOSIS — R1310 Dysphagia, unspecified: Secondary | ICD-10-CM

## 2022-12-15 DIAGNOSIS — R2689 Other abnormalities of gait and mobility: Secondary | ICD-10-CM

## 2022-12-15 DIAGNOSIS — R2681 Unsteadiness on feet: Secondary | ICD-10-CM

## 2022-12-15 DIAGNOSIS — M6281 Muscle weakness (generalized): Secondary | ICD-10-CM

## 2022-12-15 DIAGNOSIS — R278 Other lack of coordination: Secondary | ICD-10-CM

## 2022-12-15 DIAGNOSIS — R41842 Visuospatial deficit: Secondary | ICD-10-CM

## 2022-12-15 DIAGNOSIS — R471 Dysarthria and anarthria: Secondary | ICD-10-CM

## 2022-12-15 DIAGNOSIS — R208 Other disturbances of skin sensation: Secondary | ICD-10-CM

## 2022-12-15 DIAGNOSIS — R42 Dizziness and giddiness: Secondary | ICD-10-CM

## 2022-12-15 NOTE — Therapy (Signed)
OUTPATIENT SPEECH LANGUAGE PATHOLOGY TREATMENT   [name] MRN: [id number] DOB:October 21, 1950, 72 y.o., male Today's Date: 12/15/2022  PCP: Antony Haste, MD REFERRING PROVIDER: [name], MD  Antony Haste MD - doc)  END OF SESSION:  End of Session - 12/15/22 0857     Visit Number 2    Number of Visits 25    Date for SLP Re-Evaluation 03/09/23    SLP Start Time 0854    SLP Stop Time  0930    SLP Time Calculation (min) 36 min    Activity Tolerance Patient tolerated treatment well;Patient limited by pain   7/10 headache            Past Medical History:  Diagnosis Date   Diabetes mellitus without complication    Migraine    Past Surgical History:  Procedure Laterality Date   BACK SURGERY     GIVENS CAPSULE STUDY N/A 05/05/2015   Procedure: GIVENS CAPSULE STUDY;  Surgeon: Charna Elizabeth, MD;  Location: [facility] ENDOSCOPY;  Service: Endoscopy;  Laterality: N/A;   LOOP RECORDER INSERTION N/A 11/29/2022   Procedure: LOOP RECORDER INSERTION;  Surgeon: Maurice Small, MD;  Location: [facility] INVASIVE CV LAB;  Service: Cardiovascular;  Laterality: N/A;   Patient Active Problem List   Diagnosis Date Noted   Essential hypertension 11/30/2022   Migraine 11/30/2022   Chronic kidney disease, stage 3a 11/30/2022   Controlled type 2 diabetes mellitus without complication, without long-term current use of insulin 11/30/2022   HLD (hyperlipidemia) 11/30/2022   Acute CVA (cerebrovascular accident) 11/25/2022    ONSET DATE: 11/25/22   REFERRING DIAG: CVA  THERAPY DIAG:  Dysphagia, unspecified type  Dysarthria and anarthria  Cognitive communication deficit  Rationale for Evaluation and Treatment: Rehabilitation  SUBJECTIVE:   SUBJECTIVE STATEMENT: "Sunday was a good day - I did some walking my driveway." Pt accompanied by: self  PERTINENT HISTORY: presented 11/25/22 with AMS, blurred vision, headache, and L-sided numbness and weakness. MRI revealed patchy  small volume acute ischemic posterior right MCA territory infarct. PMH: DM, migraine  PAIN:  Are you having pain? Yes: NPRS scale: 8/10 Pain location: head Pain description: headache Aggravating factors: activity Relieving factors: rest  PATIENT GOALS: Improve communication skills  OBJECTIVE:   DIAGNOSTIC FINDINGS:  BSE 11/26/22 Clinical Impression   Pt reports no swallowing difficulties PTA. He has L side neglect that affects his ability to attend to POs presented on that side and appropriately raise them to his mouth. Oral motor exam revealed L side asymmetry and mild difficulty coodinating some movements. Pt exhibited difficulty forming a labial seal given thin liquids via straw and purees via spoon as well as oral holding during trials of solid textures. He had no observed s/s of aspiration throughout PO trials. Due to pt's awareness deficits and overall mentation, recommend Dys 2 (finely chopped) and thin liquids with full supervision. SLP will f/u as able to address potential for trials of upgraded textures as mentation improves.    COGNITION: Overall cognitive status: Impaired Areas of impairment:  Attention: Impaired: Sustained, Selective, Alternating, Divided Memory: Impaired: Short term Executive function: Impaired: Slow processing Functional deficits: Pt states he is talking and then loses train of thought often. He is no longer managing his medication (see "S" statement) nor his appointments. "If I had a calendar (in the kitchen) it would help." Formal cognitive assessment may be warranted in the next few sessions.   AUDITORY COMPREHENSION: Overall auditory comprehension: Impaired: inasmuch as attention plays a  role  YES/NO questions: Appears intact Following directions:  affected inasmuch as attention is affected Conversation: Complex and Moderately Complex pt with slower response time than expected Interfering components: attention and pain(?) Effective technique: extra  processing time  READING COMPREHENSION: Impaired: sentence and paragraph; Reports that he needs "extra concentration" to read anything - deficit likely due to attention  EXPRESSION: verbal  VERBAL EXPRESSION: Level of generative/spontaneous verbalization: conversation Repetition: Appears intact Naming: Confrontation: [employer] Test-2: 51/60 (WNL) Pragmatics: Appears intact Comments: Pt reports "tip of the tongue" syndrome occasionally. SLP believes this is due to cognitive deficits as BNT-2 score was above the mean and within WNL. Anomia was not seen today in conversation during evaluation. Interfering components: attention  WRITTEN EXPRESSION: Dominant hand: right Written expression: Not tested  MOTOR SPEECH: Overall motor speech: Articulation was intact during evaluation, however low vocal intensity was observed - unsure if this is due to pain (HA) or muscle weakness Level of impairment: Sentence and Conversation Respiration: diaphragmatic/abdominal breathing Phonation: normal and low vocal intensity (sub 70dB) Resonance: WFL Articulation: Appears intact currently however wife and pt state pt "mumbles" when fatigued Intelligibility: Intelligible Motor planning: Appears intact  ORAL MOTOR EXAMINATION: Overall status: Impaired:   Labial: Left (Strength) Lingual: Left (ROM, Strength, and Coordination) Velum: ROM   CLINICAL SWALLOW ASSESSMENT:   Current diet: regular and thin liquids Dentition: adequate natural dentition Patient directly observed with POs: Yes: thin liquids  Feeding: able to feed self Liquids provided by: cup Oral phase signs and symptoms:  none noted - no lt labial leakage Pharyngeal phase signs and symptoms:  none noted during BSE however pt states he coughs with liquids occasionally at home Comments: Pt and wife to track coughing episodes at home and bring to SLP to assess possible need for referral for MBS.   STANDARDIZED ASSESSMENTS: [place] NAMING  TEST: -2: see results above. Pt was above the mean/WNL  PATIENT REPORTED OUTCOME MEASURES (PROM): Cognitive Function:   and Communication Effectiveness Survey: to be provided by SLP first therapy session   TODAY'S TREATMENT:                                                                                                                                         DATE:  12/15/22: Pt did not bring in cough log - forgot he was supposed to do this. SLP made a rudimentary log for pt to take. SLP discussed his medication administration with him today and see pt instructions for the result of this conversation. SLP educated how pain can interfere with rehab treatment and encouraged pt to take pain reliever if necessary prior to rehab appointments.  Pt with dysnomia (Daughter instead of daughter in law, and carport instead of garage) which were both corrected by pt.   12/09/22 (eval): SLP went through portion of pt's HEP for oral strengthening. Full program will need to be completed with pt  next time.  PATIENT EDUCATION: Education details: Eval results, HEP for lingual weakness and labial weakness Person educated: Patient and Spouse Education method: Explanation, Demonstration, Verbal cues, and Handouts Education comprehension: verbalized understanding, returned demonstration, verbal cues required, and needs further education   GOALS: Goals reviewed with patient? Yes  SHORT TERM GOALS: Target date: 01/16/23  Pt will report improved communication (speech intelligibility) after dinner between 3 sessions Baseline: Goal status: Ongoing  2.  Pt will bring cough log to two of first 4 sessions; pt will be referred for MBS if clinically warranted Baseline:  Goal status: Ongoing  3.  Pt will undergo cognitive assessment in first 3 sessions Baseline:  Goal status: Ongoing  4.  Pt speech volume in 5 minutes simple conversation will improve to WNL in 3 sessions Baseline:  Goal status: Ongoing   LONG  TERM GOALS: Target date: 03/09/23  Pt's PROM measures will increase compared to first measurement during first therapy session Baseline:  Goal status: Ongoing  2.  Pt speech volume in 10 minutes mod complex conversation will improve to WNL in 3 sessions Baseline:  Goal status: Ongoing  3.  Pt will report improved communication (speech intelligibility) after dinner between 3 sessions (after 01/16/23) Baseline:  Goal status: Ongoing  4.  TBD Baseline:  Goal status: Ongoing  5.  TBD Baseline:  Goal status: Ongoing   ASSESSMENT:  CLINICAL IMPRESSION: Patient is a 72 y.o. male who is seen in ST for speech intelligibility and cognitive communication abilities following a CVA 11-25-22. He may require a MBS; Pt forgot about cough log so SLP made one and provided it for pt so he can fill out, return, and SLP can assess this need due to WNL swallowing during a BSE today. Lastly, pt will require a cognitive communication assessment - reporting that memory appears weaker than before 11-25-22. He reported anomia in conversation however SLP did not  appreciate anomia today during evaluation and pt scored WNL on Boston Naming Test - anomic-like incidents may be due to reduced processing ability.  OBJECTIVE IMPAIRMENTS: include attention, memory, expressive language, receptive language, and dysarthria. These impairments are limiting patient from managing medications, managing appointments, household responsibilities, ADLs/IADLs, effectively communicating at home and in community, and safety when swallowing. Factors affecting potential to achieve goals and functional outcome are  none noted today . Patient will benefit from skilled SLP services to address above impairments and improve overall function.  REHAB POTENTIAL: Good  PLAN:  SLP FREQUENCY: 2x/week  SLP DURATION: 12 weeks  PLANNED INTERVENTIONS: Environmental controls, Cueing hierachy, Cognitive reorganization, Internal/external aids, Oral  motor exercises, Functional tasks, SLP instruction and feedback, Compensatory strategies, Patient/family education, and MBS    Kameryn Davern, CCC-SLP 12/15/2022, 8:59 AM

## 2022-12-15 NOTE — Therapy (Signed)
OUTPATIENT PHYSICAL THERAPY NEURO TREATMENT   [name] MRN: [id number] DOB:[birth year], 72 y.o., male Today's Date: 12/15/2022   PCP: [name], MD REFERRING PROVIDER: [name], MD >to send to PCP, [name]  END OF SESSION:  PT End of Session - 12/15/22 0756     Visit Number 2    Number of Visits 17    Date for PT Re-Evaluation 02/04/23    Authorization Type [employer] Medicare    Progress Note Due on Visit 10    PT Start Time 0802    PT Stop Time 0847    PT Time Calculation (min) 45 min    Equipment Utilized During Treatment Gait belt    Activity Tolerance Patient tolerated treatment well    Behavior During Therapy WFL for tasks assessed/performed             Past Medical History:  Diagnosis Date   Diabetes mellitus without complication    Migraine    Past Surgical History:  Procedure Laterality Date   BACK SURGERY     GIVENS CAPSULE STUDY N/A 05/05/2015   Procedure: GIVENS CAPSULE STUDY;  Surgeon: Charna Elizabeth, MD;  Location: [facility] ENDOSCOPY;  Service: Endoscopy;  Laterality: N/A;   LOOP RECORDER INSERTION N/A 11/29/2022   Procedure: LOOP RECORDER INSERTION;  Surgeon: Maurice Small, MD;  Location: [facility] INVASIVE CV LAB;  Service: Cardiovascular;  Laterality: N/A;   Patient Active Problem List   Diagnosis Date Noted   Essential hypertension 11/30/2022   Migraine 11/30/2022   Chronic kidney disease, stage 3a 11/30/2022   Controlled type 2 diabetes mellitus without complication, without long-term current use of insulin 11/30/2022   HLD (hyperlipidemia) 11/30/2022   Acute CVA (cerebrovascular accident) 11/25/2022    ONSET DATE: 11/25/2022  REFERRING DIAG: I63.9 (ICD-10-CM) - Cerebrovascular accident (CVA), unspecified mechanism   THERAPY DIAG:  Dizziness and giddiness  Unsteadiness on feet  Other abnormalities of gait and mobility  Muscle weakness (generalized)  Rationale for Evaluation and Treatment:  Rehabilitation  SUBJECTIVE:                                                                                                                                                                                             SUBJECTIVE STATEMENT: Had a good Sunday and then maybe did too much, because I haven't been feeling as good-more tired these last few days. Pt accompanied by: significant other-wife, [name]  PERTINENT HISTORY: a-fib, DM, hx of migraines, hx of back surgery, multiple knee surgeries (per pt report)  PAIN:  Are you having pain? Yes: NPRS scale: 4/10 Pain location: headache Pain  description: headache Aggravating factors: sunlight, noise Relieving factors: darkness, quietness  PRECAUTIONS: Fall and Other: loop recorder; requires supervision at all times  WEIGHT BEARING RESTRICTIONS: No  FALLS: Has patient fallen in last 6 months? Yes. Number of falls 1 with his initial CVA  LIVING ENVIRONMENT: Lives with: lives with their spouse Lives in: House/apartment Stairs: Yes: External: 3 steps; on right going up.  Home has basement, but he doesn't go down into basement. Has following equipment at home: Single point cane, Walker - 2 wheeled, and bed side commode  PLOF: Independent.  [place], mowing.  Enjoys seeing grandson play baseball.  PATIENT GOALS: To get back to normal  OBJECTIVE:    TODAY'S TREATMENT: 12/14/2022 Activity Comments  Seated LAQ 10 reps, green theraband Slowed pace, good technique  Seated march 10 reps, green theraband Slowed pace, good technique  Sit<>stand x 5 reps, UE support from mat Mild unsteadiness and sway upon standing; cues for initial increased forward lean            Access Code: [email or web address] URL: [email or web address] Date: 12/15/2022 Prepared by: [facility] - Outpatient  Rehab - [facility]  Exercises - Seated March  - 1 x daily - 5 x weekly - 3 sets - 10 reps - Seated Long Arc Quad  - 1 x daily  - 5 x weekly - 3 sets - 10 reps - Pencil Pushups  - 2 x daily - 7 x weekly - 1 sets - 5 reps  PATIENT EDUCATION: Education details: HEP Person educated: Patient Education method: Programmer, multimedia, Demonstration, Verbal cues, and Handouts Education comprehension: verbalized understanding, returned demonstration, and needs further education   VESTIBULAR ASSESSMENT   GENERAL OBSERVATION: No acute distress    SYMPTOM BEHAVIOR:   Subjective history: Recent hx of CVA and since then he feels like he is being pulled backward or to the side.  Wife (at eval) notes veering with gait.   Non-Vestibular symptoms: diplopia, migraine symptoms, and CVA 11/25/22   Type of dizziness: Imbalance (Disequilibrium) and Unsteady with head/body turns.  Feels like he is going backwards   Frequency: throughout the day   Duration: with movement   Aggravating factors: Induced by position change: sit to stand and Induced by motion: occur when walking   Relieving factors: slow movements   Progression of symptoms: unchanged   OCULOMOTOR EXAM:   Ocular Alignment: abnormal and L eye slightly lower   Ocular ROM: No Limitations and double vision with upper motion   Spontaneous Nystagmus: absent   Gaze-Induced Nystagmus: absent   Smooth Pursuits: intact   Saccades: slow, diplopia in upward vertical direction   Convergence/Divergence: 30 inches, then 19 inches (notes blurry vision farther away)    VESTIBULAR - OCULAR REFLEX:    Slow VOR: Comment: double vision horizontal and vertical, rates dizziness as 4/10   VOR Cancellation: Comment: reports dizziness 8/10   Head-Impulse Test: NT         -------------------------------------------------------------- Objective measures below taken at the initial evaluation:  DIAGNOSTIC FINDINGS: 1. 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 2. Underlying mild age-related cerebral atrophy with moderate chronic microvascular ischemic  disease.  COGNITION: Overall cognitive status: Impaired   SENSATION: Light touch: WFL and reports sometimes having tingling on left foot  COORDINATION: Decreased LLE  MUSCLE TONE: WFL BLEs  VITALS:  137/85, HR 71  POSTURE: rounded shoulders and posterior pelvic tilt  LOWER EXTREMITY ROM:   in sitting, Active  ROM WFL BLEs   LOWER EXTREMITY MMT:    MMT Right Eval Left Eval  Hip flexion 4+ 4  Hip extension    Hip abduction    Hip adduction    Hip internal rotation    Hip external rotation    Knee flexion 4 3+  Knee extension 4 3+  Ankle dorsiflexion 4 3+  Ankle plantarflexion    Ankle inversion    Ankle eversion    (Blank rows = not tested)   TRANSFERS: Assistive device utilized: None  Sit to stand: CGA Stand to sit: CGA Slowed, ataxic movements with transfers GAIT: Gait pattern: step through pattern, shuffling, ataxic, and wide BOS; veers to L Distance walked: 60 ft Assistive device utilized: Environmental consultantWalker - 2 wheeled Level of assistance: CGA Comments: Tends to vent to L  FUNCTIONAL TESTS:  5 times sit to stand: 25.34 sec without UE support 10 meter walk test: NT Berg Balance Scale: 22/56    PATIENT SURVEYS:  FOTO Intake score 50; predicted 69  TODAY'S TREATMENT:                                                                                                                              DATE: 12/09/2022    PATIENT EDUCATION: Education details: Eval results, POC, recommendation to continue to use walker due to high fall risk Person educated: Patient and Spouse Education method: Explanation Education comprehension: verbalized understanding  HOME EXERCISE PROGRAM: Not yet initiated  GOALS: Goals reviewed with patient? Yes  SHORT TERM GOALS: Target date: 01/07/2023  Pt will be supervision with HEP for improved balance, strength, gait. Baseline: Goal status: IN PROGRESS  2.  Pt will improve 5x sit<>stand to less than or equal to 20 sec to demonstrate  improved functional strength and transfer efficiency.  Baseline: 25.34 sec Goal status: IN PROGRESS  3.  Pt will improve Berg score to at least 30/56 to decrease fall risk. Baseline: 22/56 Goal status: IN PROGRESS  4.  Further vestibular testing to be completed and goal set as appropriate. Baseline:  Goal status: IN PROGRESS   LONG TERM GOALS: Target date: 02/04/2023  Pt will be supervision with HEP for improved strength, balance, gait. Baseline:  Goal status: IN PROGRESS  2.  Pt will improve 5x sit<>stand to less than or equal to 15 sec to demonstrate improved functional strength and transfer efficiency. Baseline: 25.34 sec Goal status: IN PROGRESS  3.  Pt will improve Berg score to at least 40/56 to decrease fall risk. Baseline: 22/56 Goal status: IN PROGRESS  4.  FOTO score to improve to 69 to demo improved overall functional mobility. Baseline: 50 Goal status: IN PROGRESS  5.  Further vestibular testing to be completed/goal written as appropriate. Baseline:  Goal status: IN PROGRESS  6.  Pt will ambulate at least 1000 ft indoor and outdoor surfaces, with appropriate assistive device, mod I, for outdoor and community gait. Baseline:  Goal status: IN PROGRESS  ASSESSMENT:  CLINICAL IMPRESSION: Initiated HEP for leg strengthening this visit today.  Also began to look at vestibulo-ocular testing.  Pt has double vision at 30 inches, then can briefly focus on target singly; at 19 inches, target becomes double again.  Pt becomes very dizzy with VOR cancellation.  Did not have time to assess BPPV, but given that pt is not reporting room spinning dizziness, this may not likely be a factor.  Will continue to reassess.  Pt does continue to report postural instability in posterior and lateral directions.  He will continue to benefit from skilled PT towards goals for improved functional mobility and decreased fall risk.   OBJECTIVE IMPAIRMENTS: Abnormal gait, decreased balance,  decreased knowledge of use of DME, decreased mobility, difficulty walking, decreased ROM, decreased strength, decreased safety awareness, dizziness, and postural dysfunction.   ACTIVITY LIMITATIONS: carrying, lifting, bending, standing, squatting, transfers, bathing, toileting, dressing, and locomotion level  PARTICIPATION LIMITATIONS: meal prep, cleaning, laundry, community activity, and yard work  PERSONAL FACTORS: 3+ comorbidities: see PMH; also potential visual/ vestibular component to pt's balance that needs to be further assessed  are also affecting patient's functional outcome.   REHAB POTENTIAL: Good  CLINICAL DECISION MAKING: Evolving/moderate complexity  EVALUATION COMPLEXITY: Moderate  PLAN:  PT FREQUENCY: 2x/week  PT DURATION: 8 weeks plus eval  PLANNED INTERVENTIONS: Therapeutic exercises, Therapeutic activity, Neuromuscular re-education, Balance training, Gait training, Patient/Family education, Self Care, Vestibular training, Canalith repositioning, and DME instructions  PLAN FOR NEXT SESSION: Review HEP, and continue to progress balance and strength.  Check for BPPV if needed.     Gean Maidens., PT 12/15/2022, 8:49 AM  [facility] at [facility] [address] [place], [address] [place], [place] [address] Phone # [phone] Fax # [phone]

## 2022-12-15 NOTE — Patient Instructions (Signed)
   Let's see how [name] does with his medication on his own. [name], please watch [name] as he takes his medication in the morning to ensure he does it correctly for two weeks, or until you feel comfortable he can do it independently.

## 2022-12-15 NOTE — Therapy (Signed)
OUTPATIENT OCCUPATIONAL THERAPY NEURO EVALUATION  [name] MRN: [id number] DOB:1951/05/06, 72 y.o., male Today's Date: 12/15/2022  PCP: Antony HasteMichael Badger REFERRING PROVIDER: Lynden OxfordPranav Patel   END OF SESSION:  OT End of Session - 12/15/22 1112     Visit Number 2    Number of Visits 17    Date for OT Re-Evaluation 02/07/23    Authorization Type [employer] Medicare    Progress Note Due on Visit 10    OT Start Time 0935    OT Stop Time 1020    OT Time Calculation (min) 45 min    Behavior During Therapy WFL for tasks assessed/performed              Past Medical History:  Diagnosis Date   Diabetes mellitus without complication    Migraine    Past Surgical History:  Procedure Laterality Date   BACK SURGERY     GIVENS CAPSULE STUDY N/A 05/05/2015   Procedure: GIVENS CAPSULE STUDY;  Surgeon: Charna ElizabethJyothi Mann, MD;  [facility];  Service: Endoscopy;  Laterality: N/A;   LOOP RECORDER INSERTION N/A 11/29/2022   Procedure: LOOP RECORDER INSERTION;  Surgeon: Maurice SmallMealor, Augustus E, MD;  Location: [facility] INVASIVE CV LAB;  Service: Cardiovascular;  Laterality: N/A;   Patient Active Problem List   Diagnosis Date Noted   Essential hypertension 11/30/2022   Migraine 11/30/2022   Chronic kidney disease, stage 3a 11/30/2022   Controlled type 2 diabetes mellitus without complication, without long-term current use of insulin 11/30/2022   HLD (hyperlipidemia) 11/30/2022   Acute CVA (cerebrovascular accident) 11/25/2022    ONSET DATE: 11/25/22  REFERRING DIAG: R MCA CVA  THERAPY DIAG:  Other lack of coordination  Visuospatial deficit  Other disturbances of skin sensation  Attention and concentration deficit  Rationale for Evaluation and Treatment: Rehabilitation  SUBJECTIVE:   SUBJECTIVE STATEMENT: Patient reports having a good Sunday and being able to get outside, reporting he may have done more than he should have.  Pt reports more fatigued and agitated due to that.    Pt accompanied by: significant other  PERTINENT HISTORY: DM II, Neuropathy, CKD, Migraine  PRECAUTIONS: Fall  WEIGHT BEARING RESTRICTIONS: No  PAIN:  Are you having pain? Yes: NPRS scale: 8/10 Pain location: headache Aggravating factors: fatigue Relieving factors: tylenol  FALLS: Has patient fallen in last 6 months? Yes. Number of falls 1  LIVING ENVIRONMENT: Lives with: lives with their spouse Lives in: House/apartment Has following equipment at home:  Has tons of adaptive equipment from prior family members, uses shower seat, walker, has a cane, handicapped height toilet  PLOF: Independent with basic ADLs  PATIENT GOALS: feel like myself again  OBJECTIVE:   HAND DOMINANCE: Right  ADLs: Overall ADLs: supervision Transfers/ambulation related to ADLs: walks with supervision to min assist with RW Eating: Independent Grooming: supervision UB Dressing: independent LB Dressing: supervision Toileting: supervision Bathing: supervision Tub Shower transfers: close supervision Equipment: Shower seat with back and handicapped height toilet  IADLs: Shopping: NT Light housekeeping: Not participating at this time Meal Prep: Not participating at this time Community mobility: Not driving Medication management: wife Bankermanages Financial management: wife manages Handwriting:  NT  MOBILITY STATUS: difficulty carrying objections with ambulation  POSTURE COMMENTS:  No Significant postural limitations   ACTIVITY TOLERANCE: Activity tolerance: Patient has difficulty with fatigue - reports not sleeping well at night, feels tired during the day  FUNCTIONAL OUTCOME MEASURES: FOTO: 63.75  UPPER EXTREMITY ROM:    Active ROM Right eval  Left eval  Shoulder flexion [facility]  Shoulder abduction THRUOUT THRUOUT  Shoulder adduction    Shoulder extension    Shoulder internal rotation    Shoulder external rotation    Elbow flexion    Elbow extension    Wrist flexion    Wrist  extension    Wrist ulnar deviation    Wrist radial deviation    Wrist pronation    Wrist supination    (Blank rows = not tested)  UPPER EXTREMITY MMT:     MMT Right eval Left eval  Shoulder flexion 4/5 4+/5  Shoulder abduction 4+/5 THRUOUT  Shoulder adduction THRUOUT   Shoulder extension    Shoulder internal rotation    Shoulder external rotation    Middle trapezius    Lower trapezius    Elbow flexion    Elbow extension    Wrist flexion    Wrist extension    Wrist ulnar deviation    Wrist radial deviation    Wrist pronation    Wrist supination    (Blank rows = not tested)  HAND FUNCTION: Grip strength: Right: 80 lbs; Left: 76 lbs and Lateral pinch: Right: 20 lbs, Left: 20 lbs  COORDINATION: Finger Nose Finger test: undershooting consistently with LUE 9 Hole Peg test: Right: 29.68 sec; Left: 36.25 sec  SENSATION: Light touch: Impaired  Stereognosis: Not tested Proprioception: WFL  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 Wife reports he was initially very confused - even hallucinating.  Improving but she reports he often is off topic in conversation  VISION: Subjective report: reports change in peripheral vision Baseline vision: Wears glasses for reading only Visual history: cataracts, retinal issues in left eye  VISION ASSESSMENT: Impaired Eye alignment: Impaired: mild Ocular ROM: impaired Tracking/Visual pursuits: Decreased smoothness with horizontal tracking and Decreased smoothness with vertical tracking Saccades: undershoots and decreased speed of saccadic movements Convergence: Impaired:   Visual Fields: Left visual field deficits Diplopia assessment: present in Left gaze needs further testing - seems more apparent vertically.  Need to  differentiate between gaze stabilization and diploia  Patient has difficulty with following activities due to following visual impairments: reading, navigating environment  PERCEPTION: Impaired: Spatial orientation: posterior bias  PRAXIS: WFL     TODAY'S TREATMENT:                                                              12/15/22 Vision: pt reports double vision, but is able to make objects one at far range. Pt reports anything closer than "arm length" becomes double.  Increased double vision when fatigued.   Vision: engaged in seated horizontal smooth pursuits with focus on thumb bringing stimulus from diagonal to midline on R and L.  Pt able to sustain object as single object until just to R or L of midline then object appearing as double.  Pt moderately successful with making it single briefly at midline but unable to maintain single stimulus.  Attempted proximal-distal smooth pursuits, however pt unable to achieve single vision even at far range. Trail making: Complete Trail A in 1:11 and Trail B  in 3:13.  Pt demonstrating increased difficulty with sustained vision for increased time as well as difficulty with alternating attention between sequencing numbers and letters.     12/09/22: Discussed possible referral to neuro-optometry if visual motor impairments persist.      PATIENT EDUCATION: Education details: Potential goals and additional resources Person educated: Patient and Spouse Education method: Explanation Education comprehension: needs further education  HOME EXERCISE PROGRAM: Access Code: [email or web address] URL: [email or web address] Date: 12/15/2022 Prepared by: [facility] - Outpatient  Rehab - [facility]  Exercises - Seated Horizontal Smooth Pursuit  - 2 x daily - 7 x weekly - 1 sets - 5 reps   GOALS: Goals reviewed with patient? No  SHORT TERM GOALS: Target date: 01/07/23  Patient will complete home exercise program designed to improve coordination  in left UE Goal status: IN PROGRESS  2.  Patient will complete an HEP designed to improve visual motor skills  Goal status: IN PROGRESS  3.  Patient will demonstrate improved awareness of visual management strategies for diplopia, and gaze stabilization.  Goal status: IN PROGRESS  4.  Patient will complete a shower with distant supervision  Goal status: IN PROGRESS  5.  Patient will reach to floor to obtain lightweight object to put on overhead shelf without loss of balance.    Goal status: IN PROGRESS   LONG TERM GOALS: Target date: 02/07/23  Patient will return to simple cooking - hot meal for he and his wife with intermittent min assistance and supervision  Goal status: IN PROGRESS  2.  Patient will read news article in increased font (14+) x 5 min with 100% comprehension   Goal status: IN PROGRESS  3.  Patient will demonstrate awareness of return to driving recommendations  Goal status: IN PROGRESS  4.  Patient will report effective simple hand tool use for simple household repairs  Goal status: IN PROGRESS  5.  Patient will demonstrate at least 3 second improvement in 9 hole peg test LUE  Goal status: IN PROGRESS  6.  Patient will safely return to simple outside maintenance - gardening, mowing with supervision  Goal status: IN PROGRESS  ASSESSMENT:  CLINICAL IMPRESSION: Pt demonstrating single vision with far vision and with horizontal smooth pursuits to midline.  Once at midline object becomes double, pt able to make stimulus single for short periods of time with increased effort but unable to maintain.  Pt reports understanding of smooth pursuits exercise and recommendation to complete daily.  PERFORMANCE DEFICITS: in functional skills including ADLs, IADLs, coordination, sensation, strength, pain, Fine motor control, Gross motor control, mobility, balance, endurance, decreased knowledge of use of DME, vision, and UE functional use, cognitive skills including  attention, energy/drive, memory, problem solving, safety awareness, and sequencing, and psychosocial skills including habits and routines and behaviors.   IMPAIRMENTS: are limiting patient from ADLs, IADLs, and leisure.   CO-MORBIDITIES: may have co-morbidities  that affects occupational performance. Patient will benefit from skilled OT to address above impairments and improve overall function.  MODIFICATION OR ASSISTANCE TO COMPLETE EVALUATION: Min-Moderate modification of tasks or assist with assess necessary to complete an evaluation.  OT OCCUPATIONAL PROFILE AND HISTORY: Detailed assessment: Review of records and additional review of physical, cognitive, psychosocial history related to current functional performance.  CLINICAL DECISION MAKING: Moderate - several treatment options, min-mod task modification necessary  REHAB POTENTIAL: Good  EVALUATION COMPLEXITY: Moderate    PLAN:  OT FREQUENCY: 2x/week  OT DURATION: 8 weeks  PLANNED INTERVENTIONS: self care/ADL training,  therapeutic exercise, therapeutic activity, neuromuscular re-education, balance training, functional mobility training, patient/family education, cognitive remediation/compensation, visual/perceptual remediation/compensation, and DME and/or AE instructions  RECOMMENDED OTHER SERVICES: Possibly Neuro-optometry  CONSULTED AND AGREED WITH PLAN OF CARE: Patient and family member/caregiver  PLAN FOR NEXT SESSION: Need further visual motor assessment.  He reports at end of eval that he has significant dizziness to the point of vomiting.  Has had one fall in reaching to obtain item from floor.  Needs stand balance training - dynamic.  Needs env't scanning, and gaze stabilization/ eye teaming activity   Jeriel Vivanco, OT 12/15/2022, 11:13 AM

## 2022-12-16 NOTE — Therapy (Signed)
OUTPATIENT PHYSICAL THERAPY NEURO TREATMENT   Patient Name: Sean PollackClinton K [name] MRN: [id number] DOB:[birth year], 72 y.o., male Today's Date: 12/17/2022   PCP: Eartha InchBadger, [name] C, MD REFERRING PROVIDER: [name], [name], MD >to send to PCP, Cyndia BentBadger  END OF SESSION:  PT End of Session - 12/17/22 1106     Visit Number 3    Number of Visits 17    Date for PT Re-Evaluation 02/04/23    Authorization Type [employer] Medicare    Progress Note Due on Visit 10    PT Start Time 1024   pt late from ST   PT Stop Time 1101    PT Time Calculation (min) 37 min    Activity Tolerance Patient tolerated treatment well    Behavior During Therapy WFL for tasks assessed/performed              Past Medical History:  Diagnosis Date   Diabetes mellitus without complication    Migraine    Past Surgical History:  Procedure Laterality Date   BACK SURGERY     GIVENS CAPSULE STUDY N/A 05/05/2015   Procedure: GIVENS CAPSULE STUDY;  Surgeon: Charna ElizabethJyothi Mann, MD;  [facility];  Service: Endoscopy;  Laterality: N/A;   LOOP RECORDER INSERTION N/A 11/29/2022   Procedure: LOOP RECORDER INSERTION;  Surgeon: Maurice SmallMealor, Augustus E, MD;  Location: [facility] INVASIVE CV LAB;  Service: Cardiovascular;  Laterality: N/A;   Patient Active Problem List   Diagnosis Date Noted   Essential hypertension 11/30/2022   Migraine 11/30/2022   Chronic kidney disease, stage 3a 11/30/2022   Controlled type 2 diabetes mellitus without complication, without long-term current use of insulin 11/30/2022   HLD (hyperlipidemia) 11/30/2022   Acute CVA (cerebrovascular accident) 11/25/2022    ONSET DATE: 11/25/2022  REFERRING DIAG: I63.9 (ICD-10-CM) - Cerebrovascular accident (CVA), unspecified mechanism   THERAPY DIAG:  Dizziness and giddiness  Unsteadiness on feet  Other abnormalities of gait and mobility  Muscle weakness (generalized)  Rationale for Evaluation and Treatment: Rehabilitation  SUBJECTIVE:                                                                                                                                                                                              SUBJECTIVE STATEMENT: Woke up feeling like I wasn't standing up straight. Bounced off the wall a couple times but denies falls. Reports dizziness, especially when getting out of a chair or out of the car. Reports double vision sometimes. Was put on BP meds and a blood thinner after his stroke. Reports that night of his stroke he fell and hit the L side of his  head. Reports that it is still sore.  Pt accompanied by: self  PERTINENT HISTORY: a-fib, DM, hx of migraines, hx of back surgery, multiple knee surgeries (per pt report)  PAIN:  Are you having pain? Yes: NPRS scale: 5/10 Pain location: headache Pain description: headache Aggravating factors: sunlight, noise Relieving factors: darkness, quietness  PRECAUTIONS: Fall and Other: loop recorder; requires supervision at all times  WEIGHT BEARING RESTRICTIONS: No  FALLS: Has patient fallen in last 6 months? Yes. Number of falls 1 with his initial CVA  LIVING ENVIRONMENT: Lives with: lives with their spouse Lives in: House/apartment Stairs: Yes: External: 3 steps; on right going up.  Home has basement, but he doesn't go down into basement. Has following equipment at home: Single point cane, Walker - 2 wheeled, and bed side commode  PLOF: Independent.  [place], mowing.  Enjoys seeing grandson play baseball.  PATIENT GOALS: To get back to normal  OBJECTIVE:     TODAY'S TREATMENT: 12/17/22 Activity Comments  L sidelying test C/o mild dizziness; more-so with sitting up  R sidelying test C/o mild dizziness which improved the longer he stayed in sidelying; more-so dizzy with sitting up  Sitting head turns to targets 30" 4/10 dizziness; c/o double vision turning to R  Sitting head nods to targets 30" 5/10 dizziness  STS  Without Ues; cueing for proper  set up; min A for balance required   mini squat x7 Stropped d/t poor stability  sidestepping Good stability   Standing hip ABD 10x each  Good posture        HOME EXERCISE PROGRAM Last updated: 12/17/22 Access Code: [email or web address] URL: [email or web address] Date: 12/17/2022 Prepared by: [facility] - Outpatient  Rehab - [facility]  Exercises - Seated March  - 1 x daily - 5 x weekly - 3 sets - 10 reps - Seated Long Arc Quad  - 1 x daily - 5 x weekly - 3 sets - 10 reps - Pencil Pushups  - 2 x daily - 7 x weekly - 1 sets - 5 reps - Sit to Stand with Counter Support  - 1 x daily - 5 x weekly - 2 sets - 10 reps - Seated Left Head Turns Vestibular Habituation  - 1 x daily - 5 x weekly - 2-3 sets - 30 sec hold - Seated Head Nod  - 1 x daily - 5 x weekly - 2-3 sets - 30 sec hold   PATIENT EDUCATION: Education details: edu on HEP- edu for safety Person educated: Patient Education method: Explanation, Demonstration, Tactile cues, Verbal cues, and Handouts Education comprehension: verbalized understanding and returned demonstration     Below measures were taken at time of initial evaluation unless otherwise specified:    VESTIBULAR ASSESSMENT   GENERAL OBSERVATION: No acute distress    SYMPTOM BEHAVIOR:   Subjective history: Recent hx of CVA and since then he feels like he is being pulled backward or to the side.  Wife (at eval) notes veering with gait.   Non-Vestibular symptoms: diplopia, migraine symptoms, and CVA 11/25/22   Type of dizziness: Imbalance (Disequilibrium) and Unsteady with head/body turns.  Feels like he is going backwards   Frequency: throughout the day   Duration: with movement   Aggravating factors: Induced by position change: sit to stand and Induced by motion: occur when walking   Relieving factors: slow movements   Progression of symptoms: unchanged   OCULOMOTOR EXAM:   Ocular Alignment: abnormal  and L eye slightly lower   Ocular ROM: No  Limitations and double vision with upper motion   Spontaneous Nystagmus: absent   Gaze-Induced Nystagmus: absent   Smooth Pursuits: intact   Saccades: slow, diplopia in upward vertical direction   Convergence/Divergence: 30 inches, then 19 inches (notes blurry vision farther away)    VESTIBULAR - OCULAR REFLEX:    Slow VOR: Comment: double vision horizontal and vertical, rates dizziness as 4/10   VOR Cancellation: Comment: reports dizziness 8/10   Head-Impulse Test: NT          -------------------------------------------------------------- Objective measures below taken at the initial evaluation:  DIAGNOSTIC FINDINGS: 1. 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 2. Underlying mild age-related cerebral atrophy with moderate chronic microvascular ischemic disease.  COGNITION: Overall cognitive status: Impaired   SENSATION: Light touch: WFL and reports sometimes having tingling on left foot  COORDINATION: Decreased LLE  MUSCLE TONE: WFL BLEs  VITALS:  137/85, HR 71  POSTURE: rounded shoulders and posterior pelvic tilt  LOWER EXTREMITY ROM:   in sitting, Active ROM WFL BLEs   LOWER EXTREMITY MMT:    MMT Right Eval Left Eval  Hip flexion 4+ 4  Hip extension    Hip abduction    Hip adduction    Hip internal rotation    Hip external rotation    Knee flexion 4 3+  Knee extension 4 3+  Ankle dorsiflexion 4 3+  Ankle plantarflexion    Ankle inversion    Ankle eversion    (Blank rows = not tested)   TRANSFERS: Assistive device utilized: None  Sit to stand: CGA Stand to sit: CGA Slowed, ataxic movements with transfers GAIT: Gait pattern: step through pattern, shuffling, ataxic, and wide BOS; veers to L Distance walked: 60 ft Assistive device utilized: Environmental consultant - 2 wheeled Level of assistance: CGA Comments: Tends to vent to L  FUNCTIONAL TESTS:  5 times sit to stand: 25.34 sec without UE support 10 meter walk test: NT Berg  Balance Scale: 22/56    PATIENT SURVEYS:  FOTO Intake score 50; predicted 69  TODAY'S TREATMENT:                                                                                                                              DATE: 12/09/2022    PATIENT EDUCATION: Education details: Eval results, POC, recommendation to continue to use walker due to high fall risk Person educated: Patient and Spouse Education method: Explanation Education comprehension: verbalized understanding  HOME EXERCISE PROGRAM: Not yet initiated  GOALS: Goals reviewed with patient? Yes  SHORT TERM GOALS: Target date: 01/07/2023  Pt will be supervision with HEP for improved balance, strength, gait. Baseline: Goal status: IN PROGRESS  2.  Pt will improve 5x sit<>stand to less than or equal to 20 sec to demonstrate improved functional strength and transfer efficiency.  Baseline: 25.34 sec Goal status: IN PROGRESS  3.  Pt will  improve Berg score to at least 30/56 to decrease fall risk. Baseline: 22/56 Goal status: IN PROGRESS  4.  Further vestibular testing to be completed and goal set as appropriate. Baseline:  Goal status: IN PROGRESS   LONG TERM GOALS: Target date: 02/04/2023  Pt will be supervision with HEP for improved strength, balance, gait. Baseline:  Goal status: IN PROGRESS  2.  Pt will improve 5x sit<>stand to less than or equal to 15 sec to demonstrate improved functional strength and transfer efficiency. Baseline: 25.34 sec Goal status: IN PROGRESS  3.  Pt will improve Berg score to at least 40/56 to decrease fall risk. Baseline: 22/56 Goal status: IN PROGRESS  4.  FOTO score to improve to 69 to demo improved overall functional mobility. Baseline: 50 Goal status: IN PROGRESS  5.  Further vestibular testing to be completed/goal written as appropriate. Baseline:  Goal status: IN PROGRESS  6.  Pt will ambulate at least 1000 ft indoor and outdoor surfaces, with appropriate assistive  device, mod I, for outdoor and community gait. Baseline:  Goal status: IN PROGRESS  ASSESSMENT:  CLINICAL IMPRESSION: Patient arrived to session with report of some imbalance without falls this morning. Reports dizziness and blurred vision. Patient recalls a fall on the night of his stroke, resulting in L sided head trauma, thus may be suffering from concussion in addition to stroke symptoms as patient did not appear to suffer a posterior circulation stroke. Initiate gaze stabilization in sitting with cueing for intended level of sx. Patient unsteady with STS today, thus instructed on performed at counter top at home. Patient reported understanding and without complaints at end of session.   OBJECTIVE IMPAIRMENTS: Abnormal gait, decreased balance, decreased knowledge of use of DME, decreased mobility, difficulty walking, decreased ROM, decreased strength, decreased safety awareness, dizziness, and postural dysfunction.   ACTIVITY LIMITATIONS: carrying, lifting, bending, standing, squatting, transfers, bathing, toileting, dressing, and locomotion level  PARTICIPATION LIMITATIONS: meal prep, cleaning, laundry, community activity, and yard work  PERSONAL FACTORS: 3+ comorbidities: see PMH; also potential visual/ vestibular component to pt's balance that needs to be further assessed  are also affecting patient's functional outcome.   REHAB POTENTIAL: Good  CLINICAL DECISION MAKING: Evolving/moderate complexity  EVALUATION COMPLEXITY: Moderate  PLAN:  PT FREQUENCY: 2x/week  PT DURATION: 8 weeks plus eval  PLANNED INTERVENTIONS: Therapeutic exercises, Therapeutic activity, Neuromuscular re-education, Balance training, Gait training, Patient/Family education, Self Care, Vestibular training, Canalith repositioning, and DME instructions  PLAN FOR NEXT SESSION: Review HEP, and continue to progress balance and strength; continue working on convergence, VOR, habituation    Anette Guarneri,  PT, DPT 12/17/22 11:08 AM  [facility] at [facility] [address], [address] [place], [place] [address] Phone # [phone] Fax # [phone]

## 2022-12-17 ENCOUNTER — Ambulatory Visit: Payer: Medicare Other | Admitting: Occupational Therapy

## 2022-12-17 ENCOUNTER — Ambulatory Visit: Payer: Medicare Other | Admitting: Physical Therapy

## 2022-12-17 ENCOUNTER — Encounter: Payer: Self-pay | Admitting: Physical Therapy

## 2022-12-17 ENCOUNTER — Ambulatory Visit: Payer: Medicare Other

## 2022-12-17 DIAGNOSIS — R471 Dysarthria and anarthria: Secondary | ICD-10-CM

## 2022-12-17 DIAGNOSIS — R1310 Dysphagia, unspecified: Secondary | ICD-10-CM

## 2022-12-17 DIAGNOSIS — R2681 Unsteadiness on feet: Secondary | ICD-10-CM

## 2022-12-17 DIAGNOSIS — R41842 Visuospatial deficit: Secondary | ICD-10-CM

## 2022-12-17 DIAGNOSIS — R42 Dizziness and giddiness: Secondary | ICD-10-CM

## 2022-12-17 DIAGNOSIS — R41841 Cognitive communication deficit: Secondary | ICD-10-CM

## 2022-12-17 DIAGNOSIS — R41840 Attention and concentration deficit: Secondary | ICD-10-CM

## 2022-12-17 DIAGNOSIS — R208 Other disturbances of skin sensation: Secondary | ICD-10-CM

## 2022-12-17 DIAGNOSIS — R2689 Other abnormalities of gait and mobility: Secondary | ICD-10-CM

## 2022-12-17 DIAGNOSIS — R278 Other lack of coordination: Secondary | ICD-10-CM

## 2022-12-17 DIAGNOSIS — M6281 Muscle weakness (generalized): Secondary | ICD-10-CM

## 2022-12-17 NOTE — Therapy (Signed)
OUTPATIENT SPEECH LANGUAGE PATHOLOGY TREATMENT   [name] MRN: [id number] DOB:11/13/1950, 72 y.o., male Today's Date: 12/17/2022  PCP: Antony Haste, MD REFERRING PROVIDER: [name], MD  Antony Haste MD - doc)  END OF SESSION:  End of Session - 12/17/22 0947     Visit Number 3    Number of Visits 25    Date for SLP Re-Evaluation 03/09/23    SLP Start Time [phone]    SLP Stop Time  1015    SLP Time Calculation (min) 38 min    Activity Tolerance Patient tolerated treatment well;Patient limited by pain   7/10 headache            Past Medical History:  Diagnosis Date   Diabetes mellitus without complication    Migraine    Past Surgical History:  Procedure Laterality Date   BACK SURGERY     GIVENS CAPSULE STUDY N/A 05/05/2015   Procedure: GIVENS CAPSULE STUDY;  Surgeon: Charna Elizabeth, MD;  Location: [facility] ENDOSCOPY;  Service: Endoscopy;  Laterality: N/A;   LOOP RECORDER INSERTION N/A 11/29/2022   Procedure: LOOP RECORDER INSERTION;  Surgeon: Maurice Small, MD;  Location: [facility] INVASIVE CV LAB;  Service: Cardiovascular;  Laterality: N/A;   Patient Active Problem List   Diagnosis Date Noted   Essential hypertension 11/30/2022   Migraine 11/30/2022   Chronic kidney disease, stage 3a 11/30/2022   Controlled type 2 diabetes mellitus without complication, without long-term current use of insulin 11/30/2022   HLD (hyperlipidemia) 11/30/2022   Acute CVA (cerebrovascular accident) 11/25/2022    ONSET DATE: 11/25/22   REFERRING DIAG: CVA  THERAPY DIAG:  Dysarthria and anarthria  Cognitive communication deficit  Dysphagia, unspecified type  Rationale for Evaluation and Treatment: Rehabilitation  SUBJECTIVE:   SUBJECTIVE STATEMENT: "I haven't had any coughing or strangling episodes." Pt accompanied by: self  PERTINENT HISTORY: presented 11/25/22 with AMS, blurred vision, headache, and L-sided numbness and weakness. 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 PMH: DM, migraine  PAIN:  Are you having pain? Yes: NPRS scale: 5/10 Pain location: left head Pain description: headache Aggravating factors: activity Relieving factors: rest  PATIENT GOALS: Improve communication skills  OBJECTIVE:   DIAGNOSTIC FINDINGS:  BSE 11/26/22 Clinical Impression   Pt reports no swallowing difficulties PTA. He has L side neglect that affects his ability to attend to POs presented on that side and appropriately raise them to his mouth. Oral motor exam revealed L side asymmetry and mild difficulty coodinating some movements. Pt exhibited difficulty forming a labial seal given thin liquids via straw and purees via spoon as well as oral holding during trials of solid textures. He had no observed s/s of aspiration throughout PO trials. Due to pt's awareness deficits and overall mentation, recommend Dys 2 (finely chopped) and thin liquids with full supervision. SLP will f/u as able to address potential for trials of upgraded textures as mentation improves.    COGNITION: Overall cognitive status: Impaired Areas of impairment:  Attention: Impaired: Sustained, Selective, Alternating, Divided Memory: Impaired: Short term Executive function: Impaired: Slow processing Functional deficits: Pt states he is talking and then loses train of thought often. He is no longer managing his medication (see "S" statement) nor his appointments. "If I had a calendar (in the kitchen) it would help." Formal cognitive assessment may be warranted in the next few sessions.   AUDITORY COMPREHENSION: Overall auditory comprehension: Impaired: inasmuch as attention plays a role  YES/NO  questions: Appears intact Following directions:  affected inasmuch as attention is affected Conversation: Complex and Moderately Complex pt with slower response time than expected Interfering components: attention and pain(?) Effective technique: extra  processing time  READING COMPREHENSION: Impaired: sentence and paragraph; Reports that he needs "extra concentration" to read anything - deficit likely due to attention  EXPRESSION: verbal  VERBAL EXPRESSION: Level of generative/spontaneous verbalization: conversation Repetition: Appears intact Naming: Confrontation: [employer] Test-2: 51/60 (WNL) Pragmatics: Appears intact Comments: Pt reports "tip of the tongue" syndrome occasionally. SLP believes this is due to cognitive deficits as BNT-2 score was above the mean and within WNL. Anomia was not seen today in conversation during evaluation. Interfering components: attention  WRITTEN EXPRESSION: Dominant hand: right Written expression: Not tested  MOTOR SPEECH: Overall motor speech: Articulation was intact during evaluation, however low vocal intensity was observed - unsure if this is due to pain (HA) or muscle weakness Level of impairment: Sentence and Conversation Respiration: diaphragmatic/abdominal breathing Phonation: normal and low vocal intensity (sub 70dB) Resonance: WFL Articulation: Appears intact currently however wife and pt state pt "mumbles" when fatigued Intelligibility: Intelligible Motor planning: Appears intact  ORAL MOTOR EXAMINATION: Overall status: Impaired:   Labial: Left (Strength) Lingual: Left (ROM, Strength, and Coordination) Velum: ROM   CLINICAL SWALLOW ASSESSMENT:   Current diet: regular and thin liquids Dentition: adequate natural dentition Patient directly observed with POs: Yes: thin liquids  Feeding: able to feed self Liquids provided by: cup Oral phase signs and symptoms:  none noted - no lt labial leakage Pharyngeal phase signs and symptoms:  none noted during BSE however pt states he coughs with liquids occasionally at home Comments: Pt and wife to track coughing episodes at home and bring to SLP to assess possible need for referral for MBS.   STANDARDIZED ASSESSMENTS: [place] NAMING  TEST: -2: see results above. Pt was above the mean/WNL  PATIENT REPORTED OUTCOME MEASURES (PROM): Cognitive Function:   and Communication Effectiveness Survey: to be provided by SLP first therapy session   TODAY'S TREATMENT:                                                                                                                                         DATE:  12/17/22: Perform CLQT next session. Pt took medicines this morning and he did them WNL. MD visit yesterday brought new meds and deleted some others and pt to pick those up today.  SLP engaged pt in salient min-mod complex conversation and pt had 4 anomic errors which he compensated for. SLP provided him homework for word finding.  12/15/22: Pt did not bring in cough log - forgot he was supposed to do this. SLP made a rudimentary log for pt to take. SLP discussed his medication administration with him today and see pt instructions for the result of this conversation. SLP educated how pain can interfere with rehab treatment and  encouraged pt to take pain reliever if necessary prior to rehab appointments.  Pt with dysnomia (Daughter instead of daughter in law, and carport instead of garage) which were both corrected by pt.   12/09/22 (eval): SLP went through portion of pt's HEP for oral strengthening. Full program will need to be completed with pt next time.  PATIENT EDUCATION: Education details: Eval results, HEP for lingual weakness and labial weakness Person educated: Patient and Spouse Education method: Explanation, Demonstration, Verbal cues, and Handouts Education comprehension: verbalized understanding, returned demonstration, verbal cues required, and needs further education   GOALS: Goals reviewed with patient? Yes  SHORT TERM GOALS: Target date: 01/16/23  Pt will report improved communication (speech intelligibility) after dinner between 3 sessions Baseline: Goal status: Ongoing  2.  Pt will bring cough log to two of  first 4 sessions; pt will be referred for MBS if clinically warranted Baseline:  Goal status: Ongoing  3.  Pt will undergo cognitive assessment in first 3 sessions Baseline:  Goal status: Ongoing  4.  Pt speech volume in 5 minutes simple conversation will improve to WNL in 3 sessions Baseline:  Goal status: Ongoing   LONG TERM GOALS: Target date: 03/09/23  Pt's PROM measures will increase compared to first measurement during first therapy session Baseline:  Goal status: Ongoing  2.  Pt speech volume in 10 minutes mod complex conversation will improve to WNL in 3 sessions Baseline:  Goal status: Ongoing  3.  Pt will report improved communication (speech intelligibility) after dinner between 3 sessions (after 01/16/23) Baseline:  Goal status: Ongoing    ASSESSMENT:  CLINICAL IMPRESSION: Patient is a 72 y.o. male who is seen in ST for speech intelligibility and cognitive communication abilities following a CVA 11-25-22. He may require a MBS. Lastly, pt will require a cognitive communication assessment - reporting that memory appears weaker than before 11-25-22. He reported anomia in conversation however SLP did not  appreciate anomia today during evaluation and pt scored WNL on [place] Naming Test - anomic-like incidents may be due to reduced processing ability.  OBJECTIVE IMPAIRMENTS: include attention, memory, expressive language, receptive language, and dysarthria. These impairments are limiting patient from managing medications, managing appointments, household responsibilities, ADLs/IADLs, effectively communicating at home and in community, and safety when swallowing. Factors affecting potential to achieve goals and functional outcome are  none noted today . Patient will benefit from skilled SLP services to address above impairments and improve overall function.  REHAB POTENTIAL: Good  PLAN:  SLP FREQUENCY: 2x/week  SLP DURATION: 12 weeks  PLANNED INTERVENTIONS: Environmental  controls, Cueing hierachy, Cognitive reorganization, Internal/external aids, Oral motor exercises, Functional tasks, SLP instruction and feedback, Compensatory strategies, Patient/family education, and MBS    Shaniece Bussa, CCC-SLP 12/17/2022, 9:47 AM

## 2022-12-17 NOTE — Therapy (Signed)
OUTPATIENT OCCUPATIONAL THERAPY NEURO  Treatment Note  [name] MRN: [id number] DOB:08-30-1951, 72 y.o., male Today's Date: 12/17/2022  PCP: Antony Haste REFERRING PROVIDER: Lynden Oxford   END OF SESSION:  OT End of Session - 12/17/22 1106     Visit Number 3    Number of Visits 17    Date for OT Re-Evaluation 02/07/23    Authorization Type [employer]    Progress Note Due on Visit 10    OT Start Time 1103    OT Stop Time 1145    OT Time Calculation (min) 42 min    Behavior During Therapy WFL for tasks assessed/performed               Past Medical History:  Diagnosis Date   Diabetes mellitus without complication    Migraine    Past Surgical History:  Procedure Laterality Date   BACK SURGERY     GIVENS CAPSULE STUDY N/A 05/05/2015   Procedure: GIVENS CAPSULE STUDY;  Surgeon: Charna Elizabeth, MD;  Location: [facility] ENDOSCOPY;  Service: Endoscopy;  Laterality: N/A;   LOOP RECORDER INSERTION N/A 11/29/2022   Procedure: LOOP RECORDER INSERTION;  Surgeon: Maurice Small, MD;  Location: [facility] INVASIVE CV LAB;  Service: Cardiovascular;  Laterality: N/A;   Patient Active Problem List   Diagnosis Date Noted   Essential hypertension 11/30/2022   Migraine 11/30/2022   Chronic kidney disease, stage 3a 11/30/2022   Controlled type 2 diabetes mellitus without complication, without long-term current use of insulin 11/30/2022   HLD (hyperlipidemia) 11/30/2022   Acute CVA (cerebrovascular accident) 11/25/2022    ONSET DATE: 11/25/22  REFERRING DIAG: R MCA CVA  THERAPY DIAG:  Other lack of coordination  Visuospatial deficit  Other disturbances of skin sensation  Attention and concentration deficit  Rationale for Evaluation and Treatment: Rehabilitation  SUBJECTIVE:   SUBJECTIVE STATEMENT: Patient reports headache increasing after PT session with bending forward and looking up/down. Pt accompanied by: significant other  PERTINENT HISTORY: DM II,  Neuropathy, CKD, Migraine  PRECAUTIONS: Fall  WEIGHT BEARING RESTRICTIONS: No  PAIN:  Are you having pain? Yes: NPRS scale: 5/10 Pain location: headache Aggravating factors: fatigue Relieving factors: tylenol  FALLS: Has patient fallen in last 6 months? Yes. Number of falls 1  LIVING ENVIRONMENT: Lives with: lives with their spouse Lives in: House/apartment Has following equipment at home:  Has tons of adaptive equipment from prior family members, uses shower seat, walker, has a cane, handicapped height toilet  PLOF: Independent with basic ADLs  PATIENT GOALS: feel like myself again  OBJECTIVE:   HAND DOMINANCE: Right  ADLs: Overall ADLs: supervision Transfers/ambulation related to ADLs: walks with supervision to min assist with RW Eating: Independent Grooming: supervision UB Dressing: independent LB Dressing: supervision Toileting: supervision Bathing: supervision Tub Shower transfers: close supervision Equipment: Shower seat with back and handicapped height toilet  IADLs: Shopping: NT Light housekeeping: Not participating at this time Meal Prep: Not participating at this time Community mobility: Not driving Medication management: wife Banker: wife manages Handwriting:  NT  MOBILITY STATUS: difficulty carrying objections with ambulation  POSTURE COMMENTS:  No Significant postural limitations   ACTIVITY TOLERANCE: Activity tolerance: Patient has difficulty with fatigue - reports not sleeping well at night, feels tired during the day  FUNCTIONAL OUTCOME MEASURES: FOTO: 63.75  UPPER EXTREMITY ROM:    Active ROM Right eval Left eval  Shoulder flexion [facility]  Shoulder abduction THRUOUT THRUOUT  Shoulder adduction  Shoulder extension    Shoulder internal rotation    Shoulder external rotation    Elbow flexion    Elbow extension    Wrist flexion    Wrist extension    Wrist ulnar deviation    Wrist radial deviation     Wrist pronation    Wrist supination    (Blank rows = not tested)  UPPER EXTREMITY MMT:     MMT Right eval Left eval  Shoulder flexion 4/5 4+/5  Shoulder abduction 4+/5 THRUOUT  Shoulder adduction THRUOUT   Shoulder extension    Shoulder internal rotation    Shoulder external rotation    Middle trapezius    Lower trapezius    Elbow flexion    Elbow extension    Wrist flexion    Wrist extension    Wrist ulnar deviation    Wrist radial deviation    Wrist pronation    Wrist supination    (Blank rows = not tested)  HAND FUNCTION: Grip strength: Right: 80 lbs; Left: 76 lbs and Lateral pinch: Right: 20 lbs, Left: 20 lbs  COORDINATION: Finger Nose Finger test: undershooting consistently with LUE 9 Hole Peg test: Right: 29.68 sec; Left: 36.25 sec  SENSATION: Light touch: Impaired  Stereognosis: Not tested Proprioception: WFL  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 Wife reports he was initially very confused - even hallucinating.  Improving but she reports he often is off topic in conversation  VISION: Subjective report: reports change in peripheral vision Baseline vision: Wears glasses for reading only Visual history: cataracts, retinal issues in left eye  VISION ASSESSMENT: Impaired Eye alignment: Impaired: mild Ocular ROM: impaired Tracking/Visual pursuits: Decreased smoothness with horizontal tracking and Decreased smoothness with vertical tracking Saccades: undershoots and decreased speed of saccadic movements Convergence: Impaired:   Visual Fields: Left visual field deficits Diplopia assessment: present in Left gaze needs further testing - seems more apparent vertically.  Need to differentiate between gaze stabilization and diploia  Patient has  difficulty with following activities due to following visual impairments: reading, navigating environment  PERCEPTION: Impaired: Spatial orientation: posterior bias  PRAXIS: WFL     TODAY'S TREATMENT:                                                              12/17/22 Coordination: engaged in small peg board pattern replication, completing diagonal patterns to increase visual challenge.  Pt reports still seeing slight double vision, but being able to decipher true peg hole to double (as it is more faint).  OT initially having pt place pegs one at a time then progressing to completing with in-hand manipulation and translation from palm to finger tips to place into peg board.  Pt dropping x2 with increased challenge to translation.   Transitioned to small peg board pattern replication with use of key to identify correlating colors, challenging alternating attention, visual scanning/perception, and L hand coordination and dexterity. OT educated on use of line follow with bookmark to decrease extra stimulus from diplopia to allow for improved visual perception.  Pt utilizing bookmark during pattern replication with improved success.  Pt completing with increased time to allow for visual scanning/perception but with good manipulation of pegs one at a time and with in-hand manipulation.   GMC/vision: engaged in ball toss from R<> L hand and toss/catch with L hand with focus on motor control and vision.  Pt demonstrating min drops with L hand, able to squat down and pick up with good sequencing and safety awareness.      12/15/22 Vision: pt reports double vision, but is able to make objects one at far range. Pt reports anything closer than "arm length" becomes double.  Increased double vision when fatigued.   Vision: engaged in seated horizontal smooth pursuits with focus on thumb bringing stimulus from diagonal to midline on R and L.  Pt able to sustain object as single object until just to R or L of  midline then object appearing as double.  Pt moderately successful with making it single briefly at midline but unable to maintain single stimulus.  Attempted proximal-distal smooth pursuits, however pt unable to achieve single vision even at far range. Trail making: Complete Trail A in 1:11 and Trail B in 3:13.  Pt demonstrating increased difficulty with sustained vision for increased time as well as difficulty with alternating attention between sequencing numbers and letters.     12/09/22: Discussed possible referral to neuro-optometry if visual motor impairments persist.      PATIENT EDUCATION: Education details: Potential goals and additional resources Person educated: Patient and Spouse Education method: Explanation Education comprehension: needs further education  HOME EXERCISE PROGRAM: Coordination handout  Access Code: [email or web address] URL: [email or web address] Date: 12/15/2022 Prepared by: [facility] Outpatient  Rehab - [facility]  Exercises - Seated Horizontal Smooth Pursuit  - 2 x daily - 7 x weekly - 1 sets - 5 reps   GOALS: Goals reviewed with patient? No  SHORT TERM GOALS: Target date: 01/07/23  Patient will complete home exercise program designed to improve coordination in left UE Goal status: IN PROGRESS  2.  Patient will complete an HEP designed to improve visual motor skills  Goal status: IN PROGRESS  3.  Patient will demonstrate improved awareness of visual management strategies for diplopia, and gaze stabilization.  Goal status: IN PROGRESS  4.  Patient will complete a shower with distant supervision  Goal status: IN PROGRESS  5.  Patient will reach to floor to obtain lightweight object to put on overhead shelf without loss of balance.    Goal status: IN PROGRESS   LONG TERM GOALS: Target date: 02/07/23  Patient will return to simple cooking - hot meal for he and his wife with intermittent min assistance and supervision  Goal status:  IN PROGRESS  2.  Patient will read news article in increased font (14+) x 5 min with 100% comprehension   Goal status: IN PROGRESS  3.  Patient will demonstrate awareness of return to driving recommendations  Goal status: IN PROGRESS  4.  Patient will report effective simple hand tool use for simple household repairs  Goal status: IN PROGRESS  5.  Patient will demonstrate at least 3 second improvement in 9 hole peg test LUE  Goal status: IN PROGRESS  6.  Patient will safely return to simple outside maintenance - gardening, mowing with supervision  Goal status: IN PROGRESS  ASSESSMENT:  CLINICAL IMPRESSION: Pt able to engage in visual scanning/perception task with good ability to recognize and filter out double vision.  Pt benefiting from use of line follow with book mark to decrease  stimulus.  Pt demonstrating mild decrease in coordination both fine and gross motor, requiring increased time and effort and with increased drops with L hand.  PERFORMANCE DEFICITS: in functional skills including ADLs, IADLs, coordination, sensation, strength, pain, Fine motor control, Gross motor control, mobility, balance, endurance, decreased knowledge of use of DME, vision, and UE functional use, cognitive skills including attention, energy/drive, memory, problem solving, safety awareness, and sequencing, and psychosocial skills including habits and routines and behaviors.   IMPAIRMENTS: are limiting patient from ADLs, IADLs, and leisure.   CO-MORBIDITIES: may have co-morbidities  that affects occupational performance. Patient will benefit from skilled OT to address above impairments and improve overall function.  MODIFICATION OR ASSISTANCE TO COMPLETE EVALUATION: Min-Moderate modification of tasks or assist with assess necessary to complete an evaluation.  OT OCCUPATIONAL PROFILE AND HISTORY: Detailed assessment: Review of records and additional review of physical, cognitive, psychosocial history  related to current functional performance.  CLINICAL DECISION MAKING: Moderate - several treatment options, min-mod task modification necessary  REHAB POTENTIAL: Good  EVALUATION COMPLEXITY: Moderate    PLAN:  OT FREQUENCY: 2x/week  OT DURATION: 8 weeks  PLANNED INTERVENTIONS: self care/ADL training, therapeutic exercise, therapeutic activity, neuromuscular re-education, balance training, functional mobility training, patient/family education, cognitive remediation/compensation, visual/perceptual remediation/compensation, and DME and/or AE instructions  RECOMMENDED OTHER SERVICES: Possibly Neuro-optometry  CONSULTED AND AGREED WITH PLAN OF CARE: Patient and family member/caregiver  PLAN FOR NEXT SESSION: Need further visual motor assessment.  He reports at end of eval that he has significant dizziness to the point of vomiting.  Has had one fall in reaching to obtain item from floor.  Needs stand balance training - dynamic.  Needs env't scanning, and gaze stabilization/ eye teaming activity   Kymari Nuon, OT 12/17/2022, 11:06 AM

## 2022-12-21 NOTE — Therapy (Signed)
OUTPATIENT PHYSICAL THERAPY NEURO TREATMENT   [name] MRN: [id number] DOB:[birth year], 72 y.o., male Today's Date: 12/22/2022   PCP: [name], MD REFERRING PROVIDER: [name], MD >to send to PCP, [name]  END OF SESSION:  PT End of Session - 12/22/22 1626     Visit Number 4    Number of Visits 17    Date for PT Re-Evaluation 02/04/23    Authorization Type [employer]    Progress Note Due on Visit 10    PT Start Time 1453    PT Stop Time 1531    PT Time Calculation (min) 38 min    Equipment Utilized During Treatment Gait belt    Activity Tolerance Patient tolerated treatment well    Behavior During Therapy WFL for tasks assessed/performed               Past Medical History:  Diagnosis Date   Diabetes mellitus without complication    Migraine    Past Surgical History:  Procedure Laterality Date   BACK SURGERY     GIVENS CAPSULE STUDY N/A 05/05/2015   Procedure: GIVENS CAPSULE STUDY;  Surgeon: Charna Elizabeth, MD;  Location: [facility] ENDOSCOPY;  Service: Endoscopy;  Laterality: N/A;   LOOP RECORDER INSERTION N/A 11/29/2022   Procedure: LOOP RECORDER INSERTION;  Surgeon: Maurice Small, MD;  Location: [facility] INVASIVE CV LAB;  Service: Cardiovascular;  Laterality: N/A;   Patient Active Problem List   Diagnosis Date Noted   Essential hypertension 11/30/2022   Migraine 11/30/2022   Chronic kidney disease, stage 3a 11/30/2022   Controlled type 2 diabetes mellitus without complication, without long-term current use of insulin 11/30/2022   HLD (hyperlipidemia) 11/30/2022   Acute CVA (cerebrovascular accident) 11/25/2022    ONSET DATE: 11/25/2022  REFERRING DIAG: I63.9 (ICD-10-CM) - Cerebrovascular accident (CVA), unspecified mechanism   THERAPY DIAG:  Dizziness and giddiness  Unsteadiness on feet  Other abnormalities of gait and mobility  Muscle weakness (generalized)  Rationale for Evaluation and Treatment:  Rehabilitation  SUBJECTIVE:                                                                                                                                                                                             SUBJECTIVE STATEMENT: I was a little sick this morning- I just couldn't tell you what was hurting if I wanted to.  Pt accompanied by: self  PERTINENT HISTORY: a-fib, DM, hx of migraines, hx of back surgery, multiple knee surgeries (per pt report)  PAIN:  Are you having pain? Yes: NPRS scale: 0/10 Pain location: headache Pain description: headache  Aggravating factors: sunlight, noise Relieving factors: darkness, quietness  PRECAUTIONS: Fall and Other: loop recorder; requires supervision at all times  WEIGHT BEARING RESTRICTIONS: No  FALLS: Has patient fallen in last 6 months? Yes. Number of falls 1 with his initial CVA  LIVING ENVIRONMENT: Lives with: lives with their spouse Lives in: House/apartment Stairs: Yes: External: 3 steps; on right going up.  Home has basement, but he doesn't go down into basement. Has following equipment at home: Single point cane, Walker - 2 wheeled, and bed side commode  PLOF: Independent.  [place], mowing.  Enjoys seeing grandson play baseball.  PATIENT GOALS: To get back to normal  OBJECTIVE:    TODAY'S TREATMENT: 12/22/22 Activity Comments  Vitals  97% spO2, 81 bpm, 126/82 mmHg  brandt daroff R/L 2x each C/o "severe" dizziness and evident imbalance upon sitting up from B sides. Min A required to sit up  Sitting R/L elbow prop + gaze on target  2 sets of 4 Report of "mild" dizziness. Poor sitting balance requiring CGA/supervision   sitting head turns/nods to targets 30" each C/o mild dizziness   standing 1/4 turns to targets At counter ; no c/o dizziness  Mini squat 10x CGA; cueing to avoid going too low d/t LE fatigue   fwd back stepping 10x each LE 1 UE support and CGA; cueing to widen BOS, LOB  alt  side step 10x At counter; heavy UE reliance       HOME EXERCISE PROGRAM Last updated: 12/17/22 Access Code: [email or web address] URL: [email or web address] Date: 12/17/2022 Prepared by: [facility] - Outpatient  Rehab - [facility]  Exercises - Seated March  - 1 x daily - 5 x weekly - 3 sets - 10 reps - Seated Long Arc Quad  - 1 x daily - 5 x weekly - 3 sets - 10 reps - Pencil Pushups  - 2 x daily - 7 x weekly - 1 sets - 5 reps - Sit to Stand with Counter Support  - 1 x daily - 5 x weekly - 2 sets - 10 reps - Seated Left Head Turns Vestibular Habituation  - 1 x daily - 5 x weekly - 2-3 sets - 30 sec hold - Seated Head Nod  - 1 x daily - 5 x weekly - 2-3 sets - 30 sec hold      Below measures were taken at time of initial evaluation unless otherwise specified:    VESTIBULAR ASSESSMENT   GENERAL OBSERVATION: No acute distress    SYMPTOM BEHAVIOR:   Subjective history: Recent hx of CVA and since then he feels like he is being pulled backward or to the side.  Wife (at eval) notes veering with gait.   Non-Vestibular symptoms: diplopia, migraine symptoms, and CVA 11/25/22   Type of dizziness: Imbalance (Disequilibrium) and Unsteady with head/body turns.  Feels like he is going backwards   Frequency: throughout the day   Duration: with movement   Aggravating factors: Induced by position change: sit to stand and Induced by motion: occur when walking   Relieving factors: slow movements   Progression of symptoms: unchanged   OCULOMOTOR EXAM:   Ocular Alignment: abnormal and L eye slightly lower   Ocular ROM: No Limitations and double vision with upper motion   Spontaneous Nystagmus: absent   Gaze-Induced Nystagmus: absent   Smooth Pursuits: intact   Saccades: slow, diplopia in upward vertical direction   Convergence/Divergence: 30 inches, then 19 inches (notes  blurry vision farther away)    VESTIBULAR - OCULAR REFLEX:    Slow VOR: Comment: double vision horizontal and  vertical, rates dizziness as 4/10   VOR Cancellation: Comment: reports dizziness 8/10   Head-Impulse Test: NT          -------------------------------------------------------------- Objective measures below taken at the initial evaluation:  DIAGNOSTIC FINDINGS: 1. 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 2. Underlying mild age-related cerebral atrophy with moderate chronic microvascular ischemic disease.  COGNITION: Overall cognitive status: Impaired   SENSATION: Light touch: WFL and reports sometimes having tingling on left foot  COORDINATION: Decreased LLE  MUSCLE TONE: WFL BLEs  VITALS:  137/85, HR 71  POSTURE: rounded shoulders and posterior pelvic tilt  LOWER EXTREMITY ROM:   in sitting, Active ROM WFL BLEs   LOWER EXTREMITY MMT:    MMT Right Eval Left Eval  Hip flexion 4+ 4  Hip extension    Hip abduction    Hip adduction    Hip internal rotation    Hip external rotation    Knee flexion 4 3+  Knee extension 4 3+  Ankle dorsiflexion 4 3+  Ankle plantarflexion    Ankle inversion    Ankle eversion    (Blank rows = not tested)   TRANSFERS: Assistive device utilized: None  Sit to stand: CGA Stand to sit: CGA Slowed, ataxic movements with transfers GAIT: Gait pattern: step through pattern, shuffling, ataxic, and wide BOS; veers to L Distance walked: 60 ft Assistive device utilized: Environmental consultant - 2 wheeled Level of assistance: CGA Comments: Tends to vent to L  FUNCTIONAL TESTS:  5 times sit to stand: 25.34 sec without UE support 10 meter walk test: NT Berg Balance Scale: 22/56    PATIENT SURVEYS:  FOTO Intake score 50; predicted 69  TODAY'S TREATMENT:                                                                                                                              DATE: 12/09/2022    PATIENT EDUCATION: Education details: Eval results, POC, recommendation to continue to use walker due to high fall  risk Person educated: Patient and Spouse Education method: Explanation Education comprehension: verbalized understanding  HOME EXERCISE PROGRAM: Not yet initiated  GOALS: Goals reviewed with patient? Yes  SHORT TERM GOALS: Target date: 01/07/2023  Pt will be supervision with HEP for improved balance, strength, gait. Baseline: Goal status: IN PROGRESS  2.  Pt will improve 5x sit<>stand to less than or equal to 20 sec to demonstrate improved functional strength and transfer efficiency.  Baseline: 25.34 sec Goal status: IN PROGRESS  3.  Pt will improve Berg score to at least 30/56 to decrease fall risk. Baseline: 22/56 Goal status: IN PROGRESS  4.  Further vestibular testing to be completed and goal set as appropriate. Baseline:  Goal status: IN PROGRESS   LONG TERM GOALS: Target date: 02/04/2023  Pt will be supervision  with HEP for improved strength, balance, gait. Baseline:  Goal status: IN PROGRESS  2.  Pt will improve 5x sit<>stand to less than or equal to 15 sec to demonstrate improved functional strength and transfer efficiency. Baseline: 25.34 sec Goal status: IN PROGRESS  3.  Pt will improve Berg score to at least 40/56 to decrease fall risk. Baseline: 22/56 Goal status: IN PROGRESS  4.  FOTO score to improve to 69 to demo improved overall functional mobility. Baseline: 50 Goal status: IN PROGRESS  5.  Further vestibular testing to be completed/goal written as appropriate. Baseline:  Goal status: IN PROGRESS  6.  Pt will ambulate at least 1000 ft indoor and outdoor surfaces, with appropriate assistive device, mod I, for outdoor and community gait. Baseline:  Goal status: IN PROGRESS  ASSESSMENT:  CLINICAL IMPRESSION:  Patient arrived to session with report of feeling unwell this AM. Vitals were normal, thus proceeded with session while monitoring symptoms. Initiated habituation which brought on c/o severe dizziness; better tolerance for side to side  propping rather than laying fully onto mat. Patient with limited sitting balance and requires at least min A for sidelying to sit.  Able to perform progressive standing balance and LE strengthening activities with counter support. Patient is slow-moving and requires CGA with these activities for safety. No complaints at end of session.    OBJECTIVE IMPAIRMENTS: Abnormal gait, decreased balance, decreased knowledge of use of DME, decreased mobility, difficulty walking, decreased ROM, decreased strength, decreased safety awareness, dizziness, and postural dysfunction.   ACTIVITY LIMITATIONS: carrying, lifting, bending, standing, squatting, transfers, bathing, toileting, dressing, and locomotion level  PARTICIPATION LIMITATIONS: meal prep, cleaning, laundry, community activity, and yard work  PERSONAL FACTORS: 3+ comorbidities: see PMH; also potential visual/ vestibular component to pt's balance that needs to be further assessed  are also affecting patient's functional outcome.   REHAB POTENTIAL: Good  CLINICAL DECISION MAKING: Evolving/moderate complexity  EVALUATION COMPLEXITY: Moderate  PLAN:  PT FREQUENCY: 2x/week  PT DURATION: 8 weeks plus eval  PLANNED INTERVENTIONS: Therapeutic exercises, Therapeutic activity, Neuromuscular re-education, Balance training, Gait training, Patient/Family education, Self Care, Vestibular training, Canalith repositioning, and DME instructions  PLAN FOR NEXT SESSION: Review HEP, and continue to progress balance and strength; continue working on convergence, VOR, habituation    Anette Guarneri, PT, DPT 12/22/22 4:28 PM  [facility] Re[facility] at [facility] [address] [place], [place] [address] Phone # [phone] Fax # [phone]

## 2022-12-22 ENCOUNTER — Ambulatory Visit: Payer: Medicare Other | Admitting: Occupational Therapy

## 2022-12-22 ENCOUNTER — Ambulatory Visit: Payer: Medicare Other

## 2022-12-22 ENCOUNTER — Encounter: Payer: Self-pay | Admitting: Physical Therapy

## 2022-12-22 ENCOUNTER — Ambulatory Visit: Payer: Medicare Other | Admitting: Physical Therapy

## 2022-12-22 DIAGNOSIS — R1310 Dysphagia, unspecified: Secondary | ICD-10-CM

## 2022-12-22 DIAGNOSIS — R278 Other lack of coordination: Secondary | ICD-10-CM

## 2022-12-22 DIAGNOSIS — M6281 Muscle weakness (generalized): Secondary | ICD-10-CM

## 2022-12-22 DIAGNOSIS — R41841 Cognitive communication deficit: Secondary | ICD-10-CM

## 2022-12-22 DIAGNOSIS — R2689 Other abnormalities of gait and mobility: Secondary | ICD-10-CM

## 2022-12-22 DIAGNOSIS — R41840 Attention and concentration deficit: Secondary | ICD-10-CM | POA: Diagnosis not present

## 2022-12-22 DIAGNOSIS — R2681 Unsteadiness on feet: Secondary | ICD-10-CM

## 2022-12-22 DIAGNOSIS — R42 Dizziness and giddiness: Secondary | ICD-10-CM

## 2022-12-22 DIAGNOSIS — R41842 Visuospatial deficit: Secondary | ICD-10-CM

## 2022-12-22 DIAGNOSIS — R471 Dysarthria and anarthria: Secondary | ICD-10-CM

## 2022-12-22 NOTE — Therapy (Signed)
OUTPATIENT OCCUPATIONAL THERAPY NEURO  Treatment Note  [name] MRN: [id number] DOB:October 15, 1950, 72 y.o., male Today's Date: 12/23/2022  PCP: Antony Haste REFERRING PROVIDER: Lynden Oxford   END OF SESSION:  OT End of Session - 12/22/22 1609     Visit Number 4    Number of Visits 17    Date for OT Re-Evaluation 02/07/23    Authorization Type [employer]    Progress Note Due on Visit 10    OT Start Time 1535    OT Stop Time 1615    OT Time Calculation (min) 40 min    Behavior During Therapy WFL for tasks assessed/performed                Past Medical History:  Diagnosis Date   Diabetes mellitus without complication    Migraine    Past Surgical History:  Procedure Laterality Date   BACK SURGERY     GIVENS CAPSULE STUDY N/A 05/05/2015   Procedure: GIVENS CAPSULE STUDY;  Surgeon: Charna Elizabeth, MD;  Location: [facility] ENDOSCOPY;  Service: Endoscopy;  Laterality: N/A;   LOOP RECORDER INSERTION N/A 11/29/2022   Procedure: LOOP RECORDER INSERTION;  Surgeon: Maurice Small, MD;  Location: [facility] INVASIVE CV LAB;  Service: Cardiovascular;  Laterality: N/A;   Patient Active Problem List   Diagnosis Date Noted   Essential hypertension 11/30/2022   Migraine 11/30/2022   Chronic kidney disease, stage 3a 11/30/2022   Controlled type 2 diabetes mellitus without complication, without long-term current use of insulin 11/30/2022   HLD (hyperlipidemia) 11/30/2022   Acute CVA (cerebrovascular accident) 11/25/2022    ONSET DATE: 11/25/22  REFERRING DIAG: R MCA CVA  THERAPY DIAG:  Muscle weakness (generalized)  Other lack of coordination  Visuospatial deficit  Rationale for Evaluation and Treatment: Rehabilitation  SUBJECTIVE:   SUBJECTIVE STATEMENT: Patient reports still getting dizzy, but it is not lasting as long. Pt accompanied by: significant other  PERTINENT HISTORY: DM II, Neuropathy, CKD, Migraine  PRECAUTIONS: Fall  WEIGHT BEARING  RESTRICTIONS: No  PAIN:  Are you having pain? Yes: NPRS scale: 4/10 Pain location: dull headache Aggravating factors: fatigue Relieving factors: tylenol  FALLS: Has patient fallen in last 6 months? Yes. Number of falls 1  LIVING ENVIRONMENT: Lives with: lives with their spouse Lives in: House/apartment Has following equipment at home:  Has tons of adaptive equipment from prior family members, uses shower seat, walker, has a cane, handicapped height toilet  PLOF: Independent with basic ADLs  PATIENT GOALS: feel like myself again  OBJECTIVE:   HAND DOMINANCE: Right  ADLs: Overall ADLs: supervision Transfers/ambulation related to ADLs: walks with supervision to min assist with RW Eating: Independent Grooming: supervision UB Dressing: independent LB Dressing: supervision Toileting: supervision Bathing: supervision Tub Shower transfers: close supervision Equipment: Shower seat with back and handicapped height toilet  IADLs: Shopping: NT Light housekeeping: Not participating at this time Meal Prep: Not participating at this time Community mobility: Not driving Medication management: wife Banker: wife manages Handwriting:  NT  MOBILITY STATUS: difficulty carrying objections with ambulation  POSTURE COMMENTS:  No Significant postural limitations   ACTIVITY TOLERANCE: Activity tolerance: Patient has difficulty with fatigue - reports not sleeping well at night, feels tired during the day  FUNCTIONAL OUTCOME MEASURES: FOTO: 63.75  UPPER EXTREMITY ROM:    Active ROM Right eval Left eval  Shoulder flexion [facility]  Shoulder abduction THRUOUT THRUOUT  Shoulder adduction    Shoulder extension    Shoulder  internal rotation    Shoulder external rotation    Elbow flexion    Elbow extension    Wrist flexion    Wrist extension    Wrist ulnar deviation    Wrist radial deviation    Wrist pronation    Wrist supination    (Blank rows = not  tested)  UPPER EXTREMITY MMT:     MMT Right eval Left eval  Shoulder flexion 4/5 4+/5  Shoulder abduction 4+/5 THRUOUT  Shoulder adduction THRUOUT   Shoulder extension    Shoulder internal rotation    Shoulder external rotation    Middle trapezius    Lower trapezius    Elbow flexion    Elbow extension    Wrist flexion    Wrist extension    Wrist ulnar deviation    Wrist radial deviation    Wrist pronation    Wrist supination    (Blank rows = not tested)  HAND FUNCTION: Grip strength: Right: 80 lbs; Left: 76 lbs and Lateral pinch: Right: 20 lbs, Left: 20 lbs  COORDINATION: Finger Nose Finger test: undershooting consistently with LUE 9 Hole Peg test: Right: 29.68 sec; Left: 36.25 sec  SENSATION: Light touch: Impaired  Stereognosis: Not tested Proprioception: WFL  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 Wife reports he was initially very confused - even hallucinating.  Improving but she reports he often is off topic in conversation  VISION: Subjective report: reports change in peripheral vision Baseline vision: Wears glasses for reading only Visual history: cataracts, retinal issues in left eye  VISION ASSESSMENT: Impaired Eye alignment: Impaired: mild Ocular ROM: impaired Tracking/Visual pursuits: Decreased smoothness with horizontal tracking and Decreased smoothness with vertical tracking Saccades: undershoots and decreased speed of saccadic movements Convergence: Impaired:   Visual Fields: Left visual field deficits Diplopia assessment: present in Left gaze needs further testing - seems more apparent vertically.  Need to differentiate between gaze stabilization and diploia  Patient has difficulty with following activities due to following visual  impairments: reading, navigating environment  PERCEPTION: Impaired: Spatial orientation: posterior bias  PRAXIS: WFL     TODAY'S TREATMENT:                                                              12/22/22 Visual scanning: engaged in Spot It activity with focus on scanning vertically and horizontally to identify matching pictures on cards with multiple pictures.  Pt reports increased difficulty with scanning horizontally.  Increased challenge to field of 9 with instruction to locate 3 matching pictures thus challenging visual scanning in more spontaneous pattern. Bell cancellation test: pt completed in 4:30, demonstrating increased distraction as task continued - pt quite verbose.  Pt omiting 4 bells at midline and in R visual field.      12/17/22 Coordination: engaged in small peg board pattern replication, completing diagonal patterns to increase visual challenge.  Pt reports still seeing slight double vision, but being able to decipher true peg hole to double (as it is more faint).  OT initially having pt place pegs one at a time then progressing to completing with  in-hand manipulation and translation from palm to finger tips to place into peg board.  Pt dropping x2 with increased challenge to translation.   Transitioned to small peg board pattern replication with use of key to identify correlating colors, challenging alternating attention, visual scanning/perception, and L hand coordination and dexterity. OT educated on use of line follow with bookmark to decrease extra stimulus from diplopia to allow for improved visual perception.  Pt utilizing bookmark during pattern replication with improved success.  Pt completing with increased time to allow for visual scanning/perception but with good manipulation of pegs one at a time and with in-hand manipulation.   GMC/vision: engaged in ball toss from R<> L hand and toss/catch with L hand with focus on motor control and vision.  Pt demonstrating  min drops with L hand, able to squat down and pick up with good sequencing and safety awareness.      12/15/22 Vision: pt reports double vision, but is able to make objects one at far range. Pt reports anything closer than "arm length" becomes double.  Increased double vision when fatigued.   Vision: engaged in seated horizontal smooth pursuits with focus on thumb bringing stimulus from diagonal to midline on R and L.  Pt able to sustain object as single object until just to R or L of midline then object appearing as double.  Pt moderately successful with making it single briefly at midline but unable to maintain single stimulus.  Attempted proximal-distal smooth pursuits, however pt unable to achieve single vision even at far range. Trail making: Complete Trail A in 1:11 and Trail B in 3:13.  Pt demonstrating increased difficulty with sustained vision for increased time as well as difficulty with alternating attention between sequencing numbers and letters.     PATIENT EDUCATION: Education details: ongoing condition specific education Person educated: Patient Education method: Explanation Education comprehension: needs further education  HOME EXERCISE PROGRAM: Coordination handout  Access Code: [email or web address] URL: [email or web address] Date: 12/15/2022 Prepared by: [facility] - Outpatient  Rehab - [facility]  Exercises - Seated Horizontal Smooth Pursuit  - 2 x daily - 7 x weekly - 1 sets - 5 reps   GOALS: Goals reviewed with patient? No  SHORT TERM GOALS: Target date: 01/07/23  Patient will complete home exercise program designed to improve coordination in left UE Goal status: IN PROGRESS  2.  Patient will complete an HEP designed to improve visual motor skills  Goal status: IN PROGRESS  3.  Patient will demonstrate improved awareness of visual management strategies for diplopia, and gaze stabilization.  Goal status: IN PROGRESS  4.  Patient will complete a  shower with distant supervision  Goal status: IN PROGRESS  5.  Patient will reach to floor to obtain lightweight object to put on overhead shelf without loss of balance.    Goal status: IN PROGRESS   LONG TERM GOALS: Target date: 02/07/23  Patient will return to simple cooking - hot meal for he and his wife with intermittent min assistance and supervision  Goal status: IN PROGRESS  2.  Patient will read news article in increased font (14+) x 5 min with 100% comprehension   Goal status: IN PROGRESS  3.  Patient will demonstrate awareness of return to driving recommendations  Goal status: IN PROGRESS  4.  Patient will report effective simple hand tool use for simple household repairs  Goal status: IN PROGRESS  5.  Patient will demonstrate at least 3 second improvement in 9 hole peg  test LUE  Goal status: IN PROGRESS  6.  Patient will safely return to simple outside maintenance - gardening, mowing with supervision  Goal status: IN PROGRESS  ASSESSMENT:  CLINICAL IMPRESSION: Pt able to engage in visual scanning/perception task with good ability to recognize and filter out double vision even when looking at busier background.  Pt demonstrating decreased attention to visual stimulus on R side of paper, however pt also demonstrating increased errors with onset of fatigue.  Pt utilizing initial horizontal scanning technique, however becoming more sporadic in scanning as he fatigued.  PERFORMANCE DEFICITS: in functional skills including ADLs, IADLs, coordination, sensation, strength, pain, Fine motor control, Gross motor control, mobility, balance, endurance, decreased knowledge of use of DME, vision, and UE functional use, cognitive skills including attention, energy/drive, memory, problem solving, safety awareness, and sequencing, and psychosocial skills including habits and routines and behaviors.   IMPAIRMENTS: are limiting patient from ADLs, IADLs, and leisure.   CO-MORBIDITIES:  may have co-morbidities  that affects occupational performance. Patient will benefit from skilled OT to address above impairments and improve overall function.  MODIFICATION OR ASSISTANCE TO COMPLETE EVALUATION: Min-Moderate modification of tasks or assist with assess necessary to complete an evaluation.  OT OCCUPATIONAL PROFILE AND HISTORY: Detailed assessment: Review of records and additional review of physical, cognitive, psychosocial history related to current functional performance.  CLINICAL DECISION MAKING: Moderate - several treatment options, min-mod task modification necessary  REHAB POTENTIAL: Good  EVALUATION COMPLEXITY: Moderate    PLAN:  OT FREQUENCY: 2x/week  OT DURATION: 8 weeks  PLANNED INTERVENTIONS: self care/ADL training, therapeutic exercise, therapeutic activity, neuromuscular re-education, balance training, functional mobility training, patient/family education, cognitive remediation/compensation, visual/perceptual remediation/compensation, and DME and/or AE instructions  RECOMMENDED OTHER SERVICES: Possibly Neuro-optometry  CONSULTED AND AGREED WITH PLAN OF CARE: Patient and family member/caregiver  PLAN FOR NEXT SESSION: Need further visual motor assessment.  He reports at end of eval that he has significant dizziness to the point of vomiting.  Has had one fall in reaching to obtain item from floor.  Needs stand balance training - dynamic.  Needs env't scanning, and gaze stabilization/ eye teaming activity   Darthy Manganelli, OT 12/23/2022, 11:09 AM

## 2022-12-22 NOTE — Therapy (Signed)
OUTPATIENT SPEECH LANGUAGE PATHOLOGY TREATMENT   [name] MRN: [id number] DOB:04-Dec-1950, 72 y.o., male Today's Date: 12/22/2022  PCP: Antony Haste, MD REFERRING PROVIDER: [name], MD  Antony Haste MD - doc)  END OF SESSION:  End of Session - 12/22/22 1534     Visit Number 4    Number of Visits 25    Date for SLP Re-Evaluation 03/09/23    SLP Start Time 1617    SLP Stop Time  1700    SLP Time Calculation (min) 43 min    Activity Tolerance Patient tolerated treatment well             Past Medical History:  Diagnosis Date   Diabetes mellitus without complication    Migraine    Past Surgical History:  Procedure Laterality Date   BACK SURGERY     GIVENS CAPSULE STUDY N/A 05/05/2015   Procedure: GIVENS CAPSULE STUDY;  Surgeon: Charna Elizabeth, MD;  Location: [facility] ENDOSCOPY;  Service: Endoscopy;  Laterality: N/A;   LOOP RECORDER INSERTION N/A 11/29/2022   Procedure: LOOP RECORDER INSERTION;  Surgeon: Maurice Small, MD;  Location: [facility] INVASIVE CV LAB;  Service: Cardiovascular;  Laterality: N/A;   Patient Active Problem List   Diagnosis Date Noted   Essential hypertension 11/30/2022   Migraine 11/30/2022   Chronic kidney disease, stage 3a 11/30/2022   Controlled type 2 diabetes mellitus without complication, without long-term current use of insulin 11/30/2022   HLD (hyperlipidemia) 11/30/2022   Acute CVA (cerebrovascular accident) 11/25/2022    ONSET DATE: 11/25/22   REFERRING DIAG: CVA  THERAPY DIAG:  Cognitive communication deficit  Dysarthria and anarthria  Dysphagia, unspecified type  Rationale for Evaluation and Treatment: Rehabilitation  SUBJECTIVE:   SUBJECTIVE STATEMENT: Pt reports coughing with tea Sunday 12/19/22, and once today with water. Pt accompanied by: self  PERTINENT HISTORY: presented 11/25/22 with AMS, blurred vision, headache, and L-sided numbness and weakness. MRI revealed patchy small volume acute  ischemic posterior right MCA territory infarct. PMH: DM, migraine  PAIN:  Are you having pain? Yes: NPRS scale: 4/10; incr'd to 6/10 by session end Pain location: head Pain description: headache Aggravating factors: activity Relieving factors: rest  PATIENT GOALS: Improve communication skills  OBJECTIVE:   DIAGNOSTIC FINDINGS:  BSE 11/26/22 Clinical Impression   Pt reports no swallowing difficulties PTA. He has L side neglect that affects his ability to attend to POs presented on that side and appropriately raise them to his mouth. Oral motor exam revealed L side asymmetry and mild difficulty coodinating some movements. Pt exhibited difficulty forming a labial seal given thin liquids via straw and purees via spoon as well as oral holding during trials of solid textures. He had no observed s/s of aspiration throughout PO trials. Due to pt's awareness deficits and overall mentation, recommend Dys 2 (finely chopped) and thin liquids with full supervision. SLP will f/u as able to address potential for trials of upgraded textures as mentation improves.    COGNITION: Overall cognitive status: Impaired Areas of impairment:  Attention: Impaired: Sustained, Selective, Alternating, Divided Memory: Impaired: Short term Executive function: Impaired: Slow processing Functional deficits: Pt states he is talking and then loses train of thought often. He is no longer managing his medication (see "S" statement) nor his appointments. "If I had a calendar (in the kitchen) it would help." Formal cognitive assessment may be warranted in the next few sessions.   AUDITORY COMPREHENSION: Overall auditory comprehension: Impaired: inasmuch as attention plays a  role  YES/NO questions: Appears intact Following directions:  affected inasmuch as attention is affected Conversation: Complex and Moderately Complex pt with slower response time than expected Interfering components: attention and pain(?) Effective  technique: extra processing time  READING COMPREHENSION: Impaired: sentence and paragraph; Reports that he needs "extra concentration" to read anything - deficit likely due to attention  EXPRESSION: verbal  VERBAL EXPRESSION: Level of generative/spontaneous verbalization: conversation Repetition: Appears intact Naming: Confrontation: [employer] Test-2: 51/60 (WNL) Pragmatics: Appears intact Comments: Pt reports "tip of the tongue" syndrome occasionally. SLP believes this is due to cognitive deficits as BNT-2 score was above the mean and within WNL. Anomia was not seen today in conversation during evaluation. Interfering components: attention  WRITTEN EXPRESSION: Dominant hand: right Written expression: Not tested  MOTOR SPEECH: Overall motor speech: Articulation was intact during evaluation, however low vocal intensity was observed - unsure if this is due to pain (HA) or muscle weakness Level of impairment: Sentence and Conversation Respiration: diaphragmatic/abdominal breathing Phonation: normal and low vocal intensity (sub 70dB) Resonance: WFL Articulation: Appears intact currently however wife and pt state pt "mumbles" when fatigued Intelligibility: Intelligible Motor planning: Appears intact  ORAL MOTOR EXAMINATION: Overall status: Impaired:   Labial: Left (Strength) Lingual: Left (ROM, Strength, and Coordination) Velum: ROM   CLINICAL SWALLOW ASSESSMENT:   Current diet: regular and thin liquids Dentition: adequate natural dentition Patient directly observed with POs: Yes: thin liquids  Feeding: able to feed self Liquids provided by: cup Oral phase signs and symptoms:  none noted - no lt labial leakage Pharyngeal phase signs and symptoms:  none noted during BSE however pt states he coughs with liquids occasionally at home Comments: Pt and wife to track coughing episodes at home and bring to SLP to assess possible need for referral for MBS.   STANDARDIZED  ASSESSMENTS: [place] NAMING TEST: -2: see results above. Pt was above the mean/WNL  PATIENT REPORTED OUTCOME MEASURES (PROM): Cognitive Function:   and Communication Effectiveness Survey: to be provided by SLP first therapy session   TODAY'S TREATMENT:                                                                                                                                         DATE:  12/22/22: Two coughing episodes since last session - one Sunday on tea and one today with water. SLP to continue to track these. Pt without overt s/sx aspiration PNA today. SLP cont'd with pt's dysarthria HEP today and he req'd min-mod A usually for stressing/strong lingual sounds on the HEP, and for procedure. Pt stated he was performing HEP once/day.  CLQT initiated today. By end of session (15 minutes with CLQT) pt's HA increased in pain from 4/10 to 6/10. During the last 15 minutes of session (during CLQT) pt demonstrated word finding difficulty x2; Extra time (<3 seconds) necessary for pt to be successful. Prior to this,  pt without difficulty in min-mod complex conversation.   12/17/22: Perform CLQT next session. Pt took medicines this morning and he did them WNL. MD visit yesterday brought new meds and deleted some others and pt to pick those up today.  SLP engaged pt in salient min-mod complex conversation and pt had 4 anomic errors which he compensated for. SLP provided him homework for word finding.  12/15/22: Pt did not bring in cough log - forgot he was supposed to do this. SLP made a rudimentary log for pt to take. SLP discussed his medication administration with him today and see pt instructions for the result of this conversation. SLP educated how pain can interfere with rehab treatment and encouraged pt to take pain reliever if necessary prior to rehab appointments.  Pt with dysnomia (Daughter instead of daughter in law, and carport instead of garage) which were both corrected by pt.   12/09/22  (eval): SLP went through portion of pt's HEP for oral strengthening. Full program will need to be completed with pt next time.  PATIENT EDUCATION: Education details: Eval results, HEP for lingual weakness and labial weakness Person educated: Patient and Spouse Education method: Explanation, Demonstration, Verbal cues, and Handouts Education comprehension: verbalized understanding, returned demonstration, verbal cues required, and needs further education   GOALS: Goals reviewed with patient? Yes  SHORT TERM GOALS: Target date: 01/16/23  Pt will report improved communication (speech intelligibility) after dinner between 3 sessions Baseline: Goal status: Ongoing  2.  Pt will bring cough log to two of first 4 sessions; pt will be referred for MBS if clinically warranted Baseline: 12/22/22 Goal status: Ongoing  3.  Pt will undergo cognitive assessment in first 3 sessions Baseline:  Goal status: Met  4.  Pt speech volume in 5 minutes simple conversation will improve to WNL in 3 sessions Baseline:  Goal status: Ongoing   LONG TERM GOALS: Target date: 03/09/23  Pt's PROM measures will increase compared to first measurement during first therapy session Baseline:  Goal status: Ongoing  2.  Pt speech volume in 10 minutes mod complex conversation will improve to WNL in 3 sessions Baseline:  Goal status: Ongoing  3.  Pt will report improved communication (speech intelligibility) after dinner between 3 sessions (after 01/16/23) Baseline:  Goal status: Ongoing    ASSESSMENT:  CLINICAL IMPRESSION: Patient is a 72 y.o. male who is seen in ST for speech intelligibility and cognitive communication abilities following a CVA 11-25-22. He may require a MBS. SLP initiated cognitive communication assessment today. He reported anomia in conversation during evaluation however SLP did not appreciate anomia during evaluation and pt scored WNL on [place] Naming Test - anomic-like incidents may be due  to reduced processing ability. Today Chuck had 2 anomic episodes in 15 minutes while engaged with CLQT. Suspect word finding with cognitive/processing basis rather than a language deficit.  OBJECTIVE IMPAIRMENTS: include attention, memory, expressive language, receptive language, and dysarthria. These impairments are limiting patient from managing medications, managing appointments, household responsibilities, ADLs/IADLs, effectively communicating at home and in community, and safety when swallowing. Factors affecting potential to achieve goals and functional outcome are  none noted today . Patient will benefit from skilled SLP services to address above impairments and improve overall function.  REHAB POTENTIAL: Good  PLAN:  SLP FREQUENCY: 2x/week  SLP DURATION: 12 weeks  PLANNED INTERVENTIONS: Environmental controls, Cueing hierachy, Cognitive reorganization, Internal/external aids, Oral motor exercises, Functional tasks, SLP instruction and feedback, Compensatory strategies, Patient/family education, and MBS  Khai Torbert, CCC-SLP 12/22/2022, 3:35 PM

## 2022-12-24 ENCOUNTER — Encounter: Payer: Self-pay | Admitting: Physical Therapy

## 2022-12-24 ENCOUNTER — Ambulatory Visit: Payer: Medicare Other | Admitting: Occupational Therapy

## 2022-12-24 ENCOUNTER — Ambulatory Visit: Payer: Medicare Other | Admitting: Physical Therapy

## 2022-12-24 VITALS — BP 130/80 | HR 73

## 2022-12-24 DIAGNOSIS — M6281 Muscle weakness (generalized): Secondary | ICD-10-CM

## 2022-12-24 DIAGNOSIS — R2689 Other abnormalities of gait and mobility: Secondary | ICD-10-CM

## 2022-12-24 DIAGNOSIS — R2681 Unsteadiness on feet: Secondary | ICD-10-CM

## 2022-12-24 DIAGNOSIS — R42 Dizziness and giddiness: Secondary | ICD-10-CM

## 2022-12-24 DIAGNOSIS — R41840 Attention and concentration deficit: Secondary | ICD-10-CM | POA: Diagnosis not present

## 2022-12-24 DIAGNOSIS — R41842 Visuospatial deficit: Secondary | ICD-10-CM

## 2022-12-24 DIAGNOSIS — R278 Other lack of coordination: Secondary | ICD-10-CM

## 2022-12-24 NOTE — Therapy (Signed)
OUTPATIENT PHYSICAL THERAPY NEURO TREATMENT   [name] MRN: [id number] DOB:16-Feb-1951, 72 y.o., male Today's Date: 12/24/2022   PCP: [name], MD REFERRING PROVIDER: [name], MD >to send to PCP, [name]  END OF SESSION:  PT End of Session - 12/24/22 0829     Visit Number 5    Number of Visits 17    Date for PT Re-Evaluation 02/04/23    Authorization Type [employer] Medicare    Progress Note Due on Visit 10    PT Start Time 0848    PT Stop Time 0934    PT Time Calculation (min) 46 min    Equipment Utilized During Treatment Gait belt    Activity Tolerance Patient tolerated treatment well    Behavior During Therapy WFL for tasks assessed/performed               Past Medical History:  Diagnosis Date   Diabetes mellitus without complication    Migraine    Past Surgical History:  Procedure Laterality Date   BACK SURGERY     GIVENS CAPSULE STUDY N/A 05/05/2015   Procedure: GIVENS CAPSULE STUDY;  Surgeon: Charna Elizabeth, MD;  Location: [facility] ENDOSCOPY;  Service: Endoscopy;  Laterality: N/A;   LOOP RECORDER INSERTION N/A 11/29/2022   Procedure: LOOP RECORDER INSERTION;  Surgeon: Maurice Small, MD;  Location: [facility] INVASIVE CV LAB;  Service: Cardiovascular;  Laterality: N/A;   Patient Active Problem List   Diagnosis Date Noted   Essential hypertension 11/30/2022   Migraine 11/30/2022   Chronic kidney disease, stage 3a 11/30/2022   Controlled type 2 diabetes mellitus without complication, without long-term current use of insulin 11/30/2022   HLD (hyperlipidemia) 11/30/2022   Acute CVA (cerebrovascular accident) 11/25/2022    ONSET DATE: 11/25/2022  REFERRING DIAG: I63.9 (ICD-10-CM) - Cerebrovascular accident (CVA), unspecified mechanism   THERAPY DIAG:  Unsteadiness on feet  Other abnormalities of gait and mobility  Muscle weakness (generalized)  Dizziness and giddiness  Rationale for Evaluation and Treatment:  Rehabilitation  SUBJECTIVE:                                                                                                                                                                                             SUBJECTIVE STATEMENT: Usually I'm a morning person.  Just more on edge this morning.  Feel with cars moving around when we get into traffic that it makes me feel -double and fuzzy vision.  Pt accompanied by: self  PERTINENT HISTORY: a-fib, DM, hx of migraines, hx of back surgery, multiple knee surgeries (per pt report)  PAIN:  Are  you having pain? Yes: NPRS scale: 0/10 Pain location: headache Pain description: headache Aggravating factors: sunlight, noise Relieving factors: darkness, quietness  PRECAUTIONS: Fall and Other: loop recorder; requires supervision at all times  WEIGHT BEARING RESTRICTIONS: No  FALLS: Has patient fallen in last 6 months? Yes. Number of falls 1 with his initial CVA  LIVING ENVIRONMENT: Lives with: lives with their spouse Lives in: House/apartment Stairs: Yes: External: 3 steps; on right going up.  Home has basement, but he doesn't go down into basement. Has following equipment at home: Single point cane, Walker - 2 wheeled, and bed side commode  PLOF: Independent.  [place], mowing.  Enjoys seeing grandson play baseball.  PATIENT GOALS: To get back to normal  OBJECTIVE:    TODAY'S TREATMENT: 12/24/2022 Activity Comments  Vitals 134/76, HR 66 bpm  Convergence activity, using target approx 36 inches away, PT assisting to bring closer Becomes double approx 6 in closer  Seated head turns to target, head nods to target 3/10 symptoms  Sit<>stand x 10 reps Cues for increased hinge at hips for improved ease of transfer, cues for visual targets upon standing  Rhythmic stabilization for balance in standing A/P and lateral directions for stability in stationary standing Light UE support at walker  Gait 10 ft x 2, then  20 ft x 4, 60 ft with RW, cues for head turn to focus to target, then body turn Pt notes improved stability with use of targets     PATIENT EDUCATION: Education details: how to progress seated exercises with speed or distance of targets Person educated: Patient Education method: Explanation and Demonstration Education comprehension: verbalized understanding and returned demonstration    HOME EXERCISE PROGRAM Last updated: 12/17/22 Access Code: [email or web address] URL: [email or web address] Date: 12/17/2022 Prepared by: [facility] - Outpatient  Rehab - [facility]  Exercises - Seated March  - 1 x daily - 5 x weekly - 3 sets - 10 reps - Seated Long Arc Quad  - 1 x daily - 5 x weekly - 3 sets - 10 reps - Pencil Pushups  - 2 x daily - 7 x weekly - 1 sets - 5 reps - Sit to Stand with Counter Support  - 1 x daily - 5 x weekly - 2 sets - 10 reps - Seated Left Head Turns Vestibular Habituation  - 1 x daily - 5 x weekly - 2-3 sets - 30 sec hold - Seated Head Nod  - 1 x daily - 5 x weekly - 2-3 sets - 30 sec hold      Below measures were taken at time of initial evaluation unless otherwise specified:    VESTIBULAR ASSESSMENT   GENERAL OBSERVATION: No acute distress    SYMPTOM BEHAVIOR:   Subjective history: Recent hx of CVA and since then he feels like he is being pulled backward or to the side.  Wife (at eval) notes veering with gait.   Non-Vestibular symptoms: diplopia, migraine symptoms, and CVA 11/25/22   Type of dizziness: Imbalance (Disequilibrium) and Unsteady with head/body turns.  Feels like he is going backwards   Frequency: throughout the day   Duration: with movement   Aggravating factors: Induced by position change: sit to stand and Induced by motion: occur when walking   Relieving factors: slow movements   Progression of symptoms: unchanged   OCULOMOTOR EXAM:   Ocular Alignment: abnormal and L eye slightly lower   Ocular ROM: No Limitations and double vision  with upper motion   Spontaneous Nystagmus: absent   Gaze-Induced Nystagmus: absent   Smooth Pursuits: intact   Saccades: slow, diplopia in upward vertical direction   Convergence/Divergence: 30 inches, then 19 inches (notes blurry vision farther away)    VESTIBULAR - OCULAR REFLEX:    Slow VOR: Comment: double vision horizontal and vertical, rates dizziness as 4/10   VOR Cancellation: Comment: reports dizziness 8/10   Head-Impulse Test: NT          -------------------------------------------------------------- Objective measures below taken at the initial evaluation:  DIAGNOSTIC FINDINGS: 1. 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 2. Underlying mild age-related cerebral atrophy with moderate chronic microvascular ischemic disease.  COGNITION: Overall cognitive status: Impaired   SENSATION: Light touch: WFL and reports sometimes having tingling on left foot  COORDINATION: Decreased LLE  MUSCLE TONE: WFL BLEs  VITALS:  137/85, HR 71  POSTURE: rounded shoulders and posterior pelvic tilt  LOWER EXTREMITY ROM:   in sitting, Active ROM WFL BLEs   LOWER EXTREMITY MMT:    MMT Right Eval Left Eval  Hip flexion 4+ 4  Hip extension    Hip abduction    Hip adduction    Hip internal rotation    Hip external rotation    Knee flexion 4 3+  Knee extension 4 3+  Ankle dorsiflexion 4 3+  Ankle plantarflexion    Ankle inversion    Ankle eversion    (Blank rows = not tested)   TRANSFERS: Assistive device utilized: None  Sit to stand: CGA Stand to sit: CGA Slowed, ataxic movements with transfers GAIT: Gait pattern: step through pattern, shuffling, ataxic, and wide BOS; veers to L Distance walked: 60 ft Assistive device utilized: Environmental consultant - 2 wheeled Level of assistance: CGA Comments: Tends to vent to L  FUNCTIONAL TESTS:  5 times sit to stand: 25.34 sec without UE support 10 meter walk test: NT Berg Balance Scale:  22/56    PATIENT SURVEYS:  FOTO Intake score 50; predicted 69  TODAY'S TREATMENT:                                                                                                                              DATE: 12/09/2022    PATIENT EDUCATION: Education details: Eval results, POC, recommendation to continue to use walker due to high fall risk Person educated: Patient and Spouse Education method: Explanation Education comprehension: verbalized understanding  HOME EXERCISE PROGRAM: Not yet initiated  GOALS: Goals reviewed with patient? Yes  SHORT TERM GOALS: Target date: 01/07/2023  Pt will be supervision with HEP for improved balance, strength, gait. Baseline: Goal status: IN PROGRESS  2.  Pt will improve 5x sit<>stand to less than or equal to 20 sec to demonstrate improved functional strength and transfer efficiency.  Baseline: 25.34 sec Goal status: IN PROGRESS  3.  Pt will improve Berg score to at least 30/56 to decrease fall risk. Baseline: 22/56 Goal status:  IN PROGRESS  4.  Further vestibular testing to be completed and goal set as appropriate. Baseline:  Goal status: IN PROGRESS   LONG TERM GOALS: Target date: 02/04/2023  Pt will be supervision with HEP for improved strength, balance, gait. Baseline:  Goal status: IN PROGRESS  2.  Pt will improve 5x sit<>stand to less than or equal to 15 sec to demonstrate improved functional strength and transfer efficiency. Baseline: 25.34 sec Goal status: IN PROGRESS  3.  Pt will improve Berg score to at least 40/56 to decrease fall risk. Baseline: 22/56 Goal status: IN PROGRESS  4.  FOTO score to improve to 69 to demo improved overall functional mobility. Baseline: 50 Goal status: IN PROGRESS  5.  Further vestibular testing to be completed/goal written as appropriate. Baseline:  Goal status: IN PROGRESS  6.  Pt will ambulate at least 1000 ft indoor and outdoor surfaces, with appropriate assistive device, mod I,  for outdoor and community gait. Baseline:  Goal status: IN PROGRESS  ASSESSMENT:  CLINICAL IMPRESSION:  Pt reports feeling off this morning, with OT (session first) reporting he was unsteady and weaker coming into clinic.  Upon discussion, visual changes associated with double vision and movement (of cars and traffic) really increase his unsteadiness and dizziness.  After brief review of his exercises in sitting, worked on functional activities of sit<>stand and gait with turns, using visual targets for compensation.  With convergence activities, pt continues to have single vision at about 36 inches and double vision comes in about 4-6 inches closer.  He does endorse that gait with turns and use of visual target helps with feeling of stability.   OBJECTIVE IMPAIRMENTS: Abnormal gait, decreased balance, decreased knowledge of use of DME, decreased mobility, difficulty walking, decreased ROM, decreased strength, decreased safety awareness, dizziness, and postural dysfunction.   ACTIVITY LIMITATIONS: carrying, lifting, bending, standing, squatting, transfers, bathing, toileting, dressing, and locomotion level  PARTICIPATION LIMITATIONS: meal prep, cleaning, laundry, community activity, and yard work  PERSONAL FACTORS: 3+ comorbidities: see PMH; also potential visual/ vestibular component to pt's balance that needs to be further assessed  are also affecting patient's functional outcome.   REHAB POTENTIAL: Good  CLINICAL DECISION MAKING: Evolving/moderate complexity  EVALUATION COMPLEXITY: Moderate  PLAN:  PT FREQUENCY: 2x/week  PT DURATION: 8 weeks plus eval  PLANNED INTERVENTIONS: Therapeutic exercises, Therapeutic activity, Neuromuscular re-education, Balance training, Gait training, Patient/Family education, Self Care, Vestibular training, Canalith repositioning, and DME instructions  PLAN FOR NEXT SESSION: Progress balance and strength, gait; continue working on convergence, VOR,  habituation in functional activities   Lonia Blood, PT 12/24/22 9:46 AM Phone: [phone] Fax: [phone]   [facility] at [facility] [address], [address] [place], [place] [address] Phone # [phone] Fax # [phone]

## 2022-12-24 NOTE — Therapy (Deleted)
OUTPATIENT SPEECH LANGUAGE PATHOLOGY TREATMENT   [name] MRN: [id number] DOB:1950-11-05, 72 y.o., male Today's Date: 12/24/2022  PCP: Antony Haste, MD REFERRING PROVIDER: [name], MD  Antony Haste MD - doc)  END OF SESSION:    Past Medical History:  Diagnosis Date   Diabetes mellitus without complication    Migraine    Past Surgical History:  Procedure Laterality Date   BACK SURGERY     GIVENS CAPSULE STUDY N/A 05/05/2015   Procedure: GIVENS CAPSULE STUDY;  Surgeon: Charna Elizabeth, MD;  Location: [facility] ENDOSCOPY;  Service: Endoscopy;  Laterality: N/A;   LOOP RECORDER INSERTION N/A 11/29/2022   Procedure: LOOP RECORDER INSERTION;  Surgeon: Maurice Small, MD;  Location: [facility] INVASIVE CV LAB;  Service: Cardiovascular;  Laterality: N/A;   Patient Active Problem List   Diagnosis Date Noted   Essential hypertension 11/30/2022   Migraine 11/30/2022   Chronic kidney disease, stage 3a 11/30/2022   Controlled type 2 diabetes mellitus without complication, without long-term current use of insulin 11/30/2022   HLD (hyperlipidemia) 11/30/2022   Acute CVA (cerebrovascular accident) 11/25/2022    ONSET DATE: 11/25/22   REFERRING DIAG: CVA  THERAPY DIAG:  No diagnosis found.  Rationale for Evaluation and Treatment: Rehabilitation  SUBJECTIVE:   SUBJECTIVE STATEMENT: *** Pt accompanied by: self  PERTINENT HISTORY: presented 11/25/22 with AMS, blurred vision, headache, and L-sided numbness and weakness. 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 PMH: DM, migraine  PAIN:  Are you having pain? Yes: NPRS scale: 4/10; incr'd to 6/10 by session end Pain location: head Pain description: headache Aggravating factors: activity Relieving factors: rest  PATIENT GOALS: Improve communication skills  OBJECTIVE:   DIAGNOSTIC FINDINGS:  BSE 11/26/22 Clinical Impression   Pt reports no swallowing difficulties  PTA. He has L side neglect that affects his ability to attend to POs presented on that side and appropriately raise them to his mouth. Oral motor exam revealed L side asymmetry and mild difficulty coodinating some movements. Pt exhibited difficulty forming a labial seal given thin liquids via straw and purees via spoon as well as oral holding during trials of solid textures. He had no observed s/s of aspiration throughout PO trials. Due to pt's awareness deficits and overall mentation, recommend Dys 2 (finely chopped) and thin liquids with full supervision. SLP will f/u as able to address potential for trials of upgraded textures as mentation improves.    COGNITION: Overall cognitive status: Impaired Areas of impairment:  Attention: Impaired: Sustained, Selective, Alternating, Divided Memory: Impaired: Short term Executive function: Impaired: Slow processing Functional deficits: Pt states he is talking and then loses train of thought often. He is no longer managing his medication (see "S" statement) nor his appointments. "If I had a calendar (in the kitchen) it would help." Formal cognitive assessment may be warranted in the next few sessions.   AUDITORY COMPREHENSION: Overall auditory comprehension: Impaired: inasmuch as attention plays a role  YES/NO questions: Appears intact Following directions:  affected inasmuch as attention is affected Conversation: Complex and Moderately Complex pt with slower response time than expected Interfering components: attention and pain(?) Effective technique: extra processing time  READING COMPREHENSION: Impaired: sentence and paragraph; Reports that he needs "extra concentration" to read anything - deficit likely due to attention  EXPRESSION: verbal  VERBAL EXPRESSION: Level of generative/spontaneous verbalization: conversation Repetition: Appears intact Naming: Confrontation: [employer] Test-2: 51/60 (WNL) Pragmatics: Appears intact Comments: Pt  reports "tip of the  tongue" syndrome occasionally. SLP believes this is due to cognitive deficits as BNT-2 score was above the mean and within WNL. Anomia was not seen today in conversation during evaluation. Interfering components: attention  WRITTEN EXPRESSION: Dominant hand: right Written expression: Not tested  MOTOR SPEECH: Overall motor speech: Articulation was intact during evaluation, however low vocal intensity was observed - unsure if this is due to pain (HA) or muscle weakness Level of impairment: Sentence and Conversation Respiration: diaphragmatic/abdominal breathing Phonation: normal and low vocal intensity (sub 70dB) Resonance: WFL Articulation: Appears intact currently however wife and pt state pt "mumbles" when fatigued Intelligibility: Intelligible Motor planning: Appears intact  ORAL MOTOR EXAMINATION: Overall status: Impaired:   Labial: Left (Strength) Lingual: Left (ROM, Strength, and Coordination) Velum: ROM   CLINICAL SWALLOW ASSESSMENT:   Current diet: regular and thin liquids Dentition: adequate natural dentition Patient directly observed with POs: Yes: thin liquids  Feeding: able to feed self Liquids provided by: cup Oral phase signs and symptoms:  none noted - no lt labial leakage Pharyngeal phase signs and symptoms:  none noted during BSE however pt states he coughs with liquids occasionally at home Comments: Pt and wife to track coughing episodes at home and bring to SLP to assess possible need for referral for MBS.   STANDARDIZED ASSESSMENTS: [place] NAMING TEST: -2: see results above. Pt was above the mean/WNL  PATIENT REPORTED OUTCOME MEASURES (PROM): Cognitive Function:   and Communication Effectiveness Survey: to be provided by SLP first therapy session   TODAY'S TREATMENT:                                                                                                                                         DATE:  12/28/22: ***  12/22/22:  Two coughing episodes since last session - one Sunday on tea and one today with water. SLP to continue to track these. Pt without overt s/sx aspiration PNA today. SLP cont'd with pt's dysarthria HEP today and he req'd min-mod A usually for stressing/strong lingual sounds on the HEP, and for procedure. Pt stated he was performing HEP once/day.  CLQT initiated today. By end of session (15 minutes with CLQT) pt's HA increased in pain from 4/10 to 6/10. During the last 15 minutes of session (during CLQT) pt demonstrated word finding difficulty x2; Extra time (<3 seconds) necessary for pt to be successful. Prior to this, pt without difficulty in min-mod complex conversation.   12/17/22: Perform CLQT next session. Pt took medicines this morning and he did them WNL. MD visit yesterday brought new meds and deleted some others and pt to pick those up today.  SLP engaged pt in salient min-mod complex conversation and pt had 4 anomic errors which he compensated for. SLP provided him homework for word finding.  12/15/22: Pt did not bring in cough log - forgot he was supposed to do this. SLP made a  rudimentary log for pt to take. SLP discussed his medication administration with him today and see pt instructions for the result of this conversation. SLP educated how pain can interfere with rehab treatment and encouraged pt to take pain reliever if necessary prior to rehab appointments.  Pt with dysnomia (Daughter instead of daughter in law, and carport instead of garage) which were both corrected by pt.   12/09/22 (eval): SLP went through portion of pt's HEP for oral strengthening. Full program will need to be completed with pt next time.  PATIENT EDUCATION: Education details: Eval results, HEP for lingual weakness and labial weakness Person educated: Patient and Spouse Education method: Explanation, Demonstration, Verbal cues, and Handouts Education comprehension: verbalized understanding, returned demonstration,  verbal cues required, and needs further education   GOALS: Goals reviewed with patient? Yes  SHORT TERM GOALS: Target date: 01/16/23  Pt will report improved communication (speech intelligibility) after dinner between 3 sessions Baseline: Goal status: Ongoing  2.  Pt will bring cough log to two of first 4 sessions; pt will be referred for MBS if clinically warranted Baseline: 12/22/22 Goal status: Ongoing  3.  Pt will undergo cognitive assessment in first 3 sessions Baseline:  Goal status: Met  4.  Pt speech volume in 5 minutes simple conversation will improve to WNL in 3 sessions Baseline:  Goal status: Ongoing   LONG TERM GOALS: Target date: 03/09/23  Pt's PROM measures will increase compared to first measurement during first therapy session Baseline:  Goal status: Ongoing  2.  Pt speech volume in 10 minutes mod complex conversation will improve to WNL in 3 sessions Baseline:  Goal status: Ongoing  3.  Pt will report improved communication (speech intelligibility) after dinner between 3 sessions (after 01/16/23) Baseline:  Goal status: Ongoing    ASSESSMENT:  CLINICAL IMPRESSION: Patient is a 72 y.o. male who is seen in ST for speech intelligibility and cognitive communication abilities following a CVA 11-25-22. He may require a MBS. SLP initiated cognitive communication assessment today. He reported anomia in conversation during evaluation however SLP did not appreciate anomia during evaluation and pt scored WNL on [place] Naming Test - anomic-like incidents may be due to reduced processing ability. Today Chuck had 2 anomic episodes in 15 minutes while engaged with CLQT. Suspect word finding with cognitive/processing basis rather than a language deficit.  OBJECTIVE IMPAIRMENTS: include attention, memory, expressive language, receptive language, and dysarthria. These impairments are limiting patient from managing medications, managing appointments, household responsibilities,  ADLs/IADLs, effectively communicating at home and in community, and safety when swallowing. Factors affecting potential to achieve goals and functional outcome are  none noted today . Patient will benefit from skilled SLP services to address above impairments and improve overall function.  REHAB POTENTIAL: Good  PLAN:  SLP FREQUENCY: 2x/week  SLP DURATION: 12 weeks  PLANNED INTERVENTIONS: Environmental controls, Cueing hierachy, Cognitive reorganization, Internal/external aids, Oral motor exercises, Functional tasks, SLP instruction and feedback, Compensatory strategies, Patient/family education, and MBS    Maia Breslow, CCC-SLP 12/24/2022, 1:41 PM

## 2022-12-24 NOTE — Therapy (Signed)
OUTPATIENT OCCUPATIONAL THERAPY NEURO  Treatment Note  [name] MRN: [id number] DOB:Jan 14, 1951, 72 y.o., male Today's Date: 12/24/2022  PCP: Antony Haste REFERRING PROVIDER: Lynden Oxford   END OF SESSION:  OT End of Session - 12/24/22 0813     Visit Number 5    Number of Visits 17    Date for OT Re-Evaluation 02/07/23    Authorization Type [employer]    Progress Note Due on Visit 10    OT Start Time 0802    OT Stop Time 0845    OT Time Calculation (min) 43 min    Behavior During Therapy WFL for tasks assessed/performed                 Past Medical History:  Diagnosis Date   Diabetes mellitus without complication    Migraine    Past Surgical History:  Procedure Laterality Date   BACK SURGERY     GIVENS CAPSULE STUDY N/A 05/05/2015   Procedure: GIVENS CAPSULE STUDY;  Surgeon: Charna Elizabeth, MD;  Location: [facility] ENDOSCOPY;  Service: Endoscopy;  Laterality: N/A;   LOOP RECORDER INSERTION N/A 11/29/2022   Procedure: LOOP RECORDER INSERTION;  Surgeon: Maurice Small, MD;  Location: [facility] INVASIVE CV LAB;  Service: Cardiovascular;  Laterality: N/A;   Patient Active Problem List   Diagnosis Date Noted   Essential hypertension 11/30/2022   Migraine 11/30/2022   Chronic kidney disease, stage 3a 11/30/2022   Controlled type 2 diabetes mellitus without complication, without long-term current use of insulin 11/30/2022   HLD (hyperlipidemia) 11/30/2022   Acute CVA (cerebrovascular accident) 11/25/2022    ONSET DATE: 11/25/22  REFERRING DIAG: R MCA CVA  THERAPY DIAG:  Muscle weakness (generalized)  Other lack of coordination  Visuospatial deficit  Dizziness and giddiness  Unsteadiness on feet  Rationale for Evaluation and Treatment: Rehabilitation  SUBJECTIVE:   SUBJECTIVE STATEMENT: Patient reports feeling weaker this morning.  Pt reports traffic can impact his vision, reports feeling in "whirlpool" with sun shining in and cars passing  on both sides.   Pt accompanied by: significant other  PERTINENT HISTORY: DM II, Neuropathy, CKD, Migraine  PRECAUTIONS: Fall  WEIGHT BEARING RESTRICTIONS: No  PAIN:  Are you having pain? Yes: NPRS scale: 5/10 Pain location: dull headache Aggravating factors: fatigue Relieving factors: tylenol  FALLS: Has patient fallen in last 6 months? Yes. Number of falls 1  LIVING ENVIRONMENT: Lives with: lives with their spouse Lives in: House/apartment Has following equipment at home:  Has tons of adaptive equipment from prior family members, uses shower seat, walker, has a cane, handicapped height toilet  PLOF: Independent with basic ADLs  PATIENT GOALS: feel like myself again  OBJECTIVE:   HAND DOMINANCE: Right  ADLs: Overall ADLs: supervision Transfers/ambulation related to ADLs: walks with supervision to min assist with RW Eating: Independent Grooming: supervision UB Dressing: independent LB Dressing: supervision Toileting: supervision Bathing: supervision Tub Shower transfers: close supervision Equipment: Shower seat with back and handicapped height toilet  IADLs: Shopping: NT Light housekeeping: Not participating at this time Meal Prep: Not participating at this time Community mobility: Not driving Medication management: wife Banker: wife manages Handwriting:  NT  MOBILITY STATUS: difficulty carrying objections with ambulation  POSTURE COMMENTS:  No Significant postural limitations   ACTIVITY TOLERANCE: Activity tolerance: Patient has difficulty with fatigue - reports not sleeping well at night, feels tired during the day  FUNCTIONAL OUTCOME MEASURES: FOTO: 63.75  UPPER EXTREMITY ROM:    Active  ROM Right eval Left eval  Shoulder flexion [facility]  Shoulder abduction THRUOUT THRUOUT  Shoulder adduction    Shoulder extension    Shoulder internal rotation    Shoulder external rotation    Elbow flexion    Elbow extension    Wrist  flexion    Wrist extension    Wrist ulnar deviation    Wrist radial deviation    Wrist pronation    Wrist supination    (Blank rows = not tested)  UPPER EXTREMITY MMT:     MMT Right eval Left eval  Shoulder flexion 4/5 4+/5  Shoulder abduction 4+/5 THRUOUT  Shoulder adduction THRUOUT   Shoulder extension    Shoulder internal rotation    Shoulder external rotation    Middle trapezius    Lower trapezius    Elbow flexion    Elbow extension    Wrist flexion    Wrist extension    Wrist ulnar deviation    Wrist radial deviation    Wrist pronation    Wrist supination    (Blank rows = not tested)  HAND FUNCTION: Grip strength: Right: 80 lbs; Left: 76 lbs and Lateral pinch: Right: 20 lbs, Left: 20 lbs  COORDINATION: Finger Nose Finger test: undershooting consistently with LUE 9 Hole Peg test: Right: 29.68 sec; Left: 36.25 sec  SENSATION: Light touch: Impaired  Stereognosis: Not tested Proprioception: WFL  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 Wife reports he was initially very confused - even hallucinating.  Improving but she reports he often is off topic in conversation  VISION: Subjective report: reports change in peripheral vision Baseline vision: Wears glasses for reading only Visual history: cataracts, retinal issues in left eye  VISION ASSESSMENT: Impaired Eye alignment: Impaired: mild Ocular ROM: impaired Tracking/Visual pursuits: Decreased smoothness with horizontal tracking and Decreased smoothness with vertical tracking Saccades: undershoots and decreased speed of saccadic movements Convergence: Impaired:   Visual Fields: Left visual field deficits Diplopia assessment: present in Left gaze needs further testing - seems more apparent vertically.   Need to differentiate between gaze stabilization and diploia  Patient has difficulty with following activities due to following visual impairments: reading, navigating environment  PERCEPTION: Impaired: Spatial orientation: posterior bias  PRAXIS: WFL     TODAY'S TREATMENT:                                                              12/24/22 Coordination: engaged in picking up large jacks and placing in matching colored cones with LUE.  Pt demonstrating good motor control when picking up and with reaching to place in cones across midline and outside BOS. Pt demonstrating good success when placing in cone from a visual standpoint.   Vision: engaged in reaching to place jack in cone at midline with focus on making cone remain in single vision.  Pt with difficulty with changing visual attention between items at table top and back to cone at midline (around arms length) therefore pt maintaining visual attention on cone and relying on sensation when reaching for jacks.  OT challenged pt to look down at  jacks and then back up to cone to challenge ability to change focus between two items.  Pt with difficulty with alternating visual attention and returning items to single vision.  Vitals:   12/24/22 0805 12/24/22 0812  BP: 133/84 130/80  Pulse: 72 73     12/22/22 Visual scanning: engaged in Spot It activity with focus on scanning vertically and horizontally to identify matching pictures on cards with multiple pictures.  Pt reports increased difficulty with scanning horizontally.  Increased challenge to field of 9 with instruction to locate 3 matching pictures thus challenging visual scanning in more spontaneous pattern. Bell cancellation test: pt completed in 4:30, demonstrating increased distraction as task continued - pt quite verbose.  Pt omiting 4 bells at midline and in R visual field.      12/17/22 Coordination: engaged in small peg board pattern replication, completing diagonal patterns to  increase visual challenge.  Pt reports still seeing slight double vision, but being able to decipher true peg hole to double (as it is more faint).  OT initially having pt place pegs one at a time then progressing to completing with in-hand manipulation and translation from palm to finger tips to place into peg board.  Pt dropping x2 with increased challenge to translation.   Transitioned to small peg board pattern replication with use of key to identify correlating colors, challenging alternating attention, visual scanning/perception, and L hand coordination and dexterity. OT educated on use of line follow with bookmark to decrease extra stimulus from diplopia to allow for improved visual perception.  Pt utilizing bookmark during pattern replication with improved success.  Pt completing with increased time to allow for visual scanning/perception but with good manipulation of pegs one at a time and with in-hand manipulation.   GMC/vision: engaged in ball toss from R<> L hand and toss/catch with L hand with focus on motor control and vision.  Pt demonstrating min drops with L hand, able to squat down and pick up with good sequencing and safety awareness.      PATIENT EDUCATION: Education details: ongoing condition specific education Person educated: Patient Education method: Explanation Education comprehension: needs further education  HOME EXERCISE PROGRAM: Coordination handout  Access Code: [email or web address] URL: [email or web address] Date: 12/15/2022 Prepared by: [facility] - [facility]  Exercises - Seated Horizontal Smooth Pursuit  - 2 x daily - 7 x weekly - 1 sets - 5 reps   GOALS: Goals reviewed with patient? No  SHORT TERM GOALS: Target date: 01/07/23  Patient will complete home exercise program designed to improve coordination in left UE Goal status: IN PROGRESS  2.  Patient will complete an HEP designed to improve visual motor skills  Goal status:  IN PROGRESS  3.  Patient will demonstrate improved awareness of visual management strategies for diplopia, and gaze stabilization.  Goal status: IN PROGRESS  4.  Patient will complete a shower with distant supervision  Goal status: IN PROGRESS  5.  Patient will reach to floor to obtain lightweight object to put on overhead shelf without loss of balance.    Goal status: IN PROGRESS   LONG TERM GOALS: Target date: 02/07/23  Patient will return to simple cooking - hot meal for he and his wife with intermittent min assistance and supervision  Goal status: IN PROGRESS  2.  Patient will read news article in increased font (14+) x 5 min with 100% comprehension   Goal status: IN PROGRESS  3.  Patient will demonstrate awareness  of return to driving recommendations  Goal status: IN PROGRESS  4.  Patient will report effective simple hand tool use for simple household repairs  Goal status: IN PROGRESS  5.  Patient will demonstrate at least 3 second improvement in 9 hole peg test LUE  Goal status: IN PROGRESS  6.  Patient will safely return to simple outside maintenance - gardening, mowing with supervision  Goal status: IN PROGRESS  ASSESSMENT:  CLINICAL IMPRESSION: At beginning of session pt requiring increased time to come up to standing with CGA due to posterior lean.  Pt able to stand and ambulate to therapy gym with RW with CGA and wheelchair follow for safety.  BP taken prior to ambulation at afterwards with minimal change and pt reports BLE weakness but more alert/engaging after ambulation.  Pt attempting ball toss R <> L hand however reports increased dizziness and inability to complete, therefore transitioned to tasks with decreased visual stimulus.  Pt able to engage in coordination and reaching task with focus on visual attention in horizontal scanning and with linear scanning with improved ability to sustain focus in comparison to more dynamic task.  Completed tasks in seated  this session as a precaution due to initial weakness and dizziness with increased visual challenge.  PERFORMANCE DEFICITS: in functional skills including ADLs, IADLs, coordination, sensation, strength, pain, Fine motor control, Gross motor control, mobility, balance, endurance, decreased knowledge of use of DME, vision, and UE functional use, cognitive skills including attention, energy/drive, memory, problem solving, safety awareness, and sequencing, and psychosocial skills including habits and routines and behaviors.   IMPAIRMENTS: are limiting patient from ADLs, IADLs, and leisure.   CO-MORBIDITIES: may have co-morbidities  that affects occupational performance. Patient will benefit from skilled OT to address above impairments and improve overall function.  MODIFICATION OR ASSISTANCE TO COMPLETE EVALUATION: Min-Moderate modification of tasks or assist with assess necessary to complete an evaluation.  OT OCCUPATIONAL PROFILE AND HISTORY: Detailed assessment: Review of records and additional review of physical, cognitive, psychosocial history related to current functional performance.  CLINICAL DECISION MAKING: Moderate - several treatment options, min-mod task modification necessary  REHAB POTENTIAL: Good  EVALUATION COMPLEXITY: Moderate    PLAN:  OT FREQUENCY: 2x/week  OT DURATION: 8 weeks  PLANNED INTERVENTIONS: self care/ADL training, therapeutic exercise, therapeutic activity, neuromuscular re-education, balance training, functional mobility training, patient/family education, cognitive remediation/compensation, visual/perceptual remediation/compensation, and DME and/or AE instructions  RECOMMENDED OTHER SERVICES: Possibly Neuro-optometry  CONSULTED AND AGREED WITH PLAN OF CARE: Patient and family member/caregiver  PLAN FOR NEXT SESSION: Need further visual motor assessment.  He reports at end of eval that he has significant dizziness to the point of vomiting.  Has had one fall  in reaching to obtain item from floor.  Needs stand balance training - dynamic.  Needs env't scanning, and gaze stabilization/ eye teaming activity.  Provide with neuro-ophthalmologist contacts for persistent visual impairments.   Rosalio Loud, OT 12/24/2022, 8:13 AM

## 2022-12-27 ENCOUNTER — Ambulatory Visit: Payer: Medicare Other | Admitting: Speech Pathology

## 2022-12-27 ENCOUNTER — Ambulatory Visit: Payer: Medicare Other | Admitting: Physical Therapy

## 2022-12-27 ENCOUNTER — Telehealth: Payer: Self-pay | Admitting: Physical Therapy

## 2022-12-27 ENCOUNTER — Ambulatory Visit: Payer: Medicare Other | Admitting: Occupational Therapy

## 2022-12-27 NOTE — Telephone Encounter (Signed)
Called patient in regards to missed appointments for therapy today.  No answer on home phone and mobile number answering machine appears to be a business.  Did not leave message.  Lonia Blood, PT 12/27/22 3:18 PM Phone: [phone] Fax: [phone]  [facility] at [facility] [address] [place], [address] [place], [place] [address] Phone # [phone] Fax # [phone]

## 2022-12-27 NOTE — Therapy (Deleted)
OUTPATIENT PHYSICAL THERAPY NEURO TREATMENT   [name] MRN: [id number] DOB:March 16, 1951, 72 y.o., male Today's Date: 12/24/2022   PCP: [name], MD REFERRING PROVIDER: [name], MD >to send to PCP, [name]  END OF SESSION:  PT End of Session - 12/24/22 0829     Visit Number 5    Number of Visits 17    Date for PT Re-Evaluation 02/04/23    Authorization Type [employer]    Progress Note Due on Visit 10    PT Start Time 0848    PT Stop Time 0934    PT Time Calculation (min) 46 min    Equipment Utilized During Treatment Gait belt    Activity Tolerance Patient tolerated treatment well    Behavior During Therapy WFL for tasks assessed/performed               Past Medical History:  Diagnosis Date   Diabetes mellitus without complication    Migraine    Past Surgical History:  Procedure Laterality Date   BACK SURGERY     GIVENS CAPSULE STUDY N/A 05/05/2015   Procedure: GIVENS CAPSULE STUDY;  Surgeon: Charna Elizabeth, MD;  Location: [facility] ENDOSCOPY;  Service: Endoscopy;  Laterality: N/A;   LOOP RECORDER INSERTION N/A 11/29/2022   Procedure: LOOP RECORDER INSERTION;  Surgeon: Maurice Small, MD;  Location: [facility] INVASIVE CV LAB;  Service: Cardiovascular;  Laterality: N/A;   Patient Active Problem List   Diagnosis Date Noted   Essential hypertension 11/30/2022   Migraine 11/30/2022   Chronic kidney disease, stage 3a 11/30/2022   Controlled type 2 diabetes mellitus without complication, without long-term current use of insulin 11/30/2022   HLD (hyperlipidemia) 11/30/2022   Acute CVA (cerebrovascular accident) 11/25/2022    ONSET DATE: 11/25/2022  REFERRING DIAG: I63.9 (ICD-10-CM) - Cerebrovascular accident (CVA), unspecified mechanism   THERAPY DIAG:  Unsteadiness on feet  Other abnormalities of gait and mobility  Muscle weakness (generalized)  Dizziness and giddiness  Rationale for Evaluation and Treatment:  Rehabilitation  SUBJECTIVE:                                                                                                                                                                                             SUBJECTIVE STATEMENT: ***Usually I'm a morning person.  Just more on edge this morning.  Feel with cars moving around when we get into traffic that it makes me feel -double and fuzzy vision.  Pt accompanied by: self  PERTINENT HISTORY: a-fib, DM, hx of migraines, hx of back surgery, multiple knee surgeries (per pt report)  PAIN:  Are  you having pain? Yes: NPRS scale: 0/10 Pain location: headache Pain description: headache Aggravating factors: sunlight, noise Relieving factors: darkness, quietness  PRECAUTIONS: Fall and Other: loop recorder; requires supervision at all times  WEIGHT BEARING RESTRICTIONS: No  FALLS: Has patient fallen in last 6 months? Yes. Number of falls 1 with his initial CVA  LIVING ENVIRONMENT: Lives with: lives with their spouse Lives in: House/apartment Stairs: Yes: External: 3 steps; on right going up.  Home has basement, but he doesn't go down into basement. Has following equipment at home: Single point cane, Walker - 2 wheeled, and bed side commode  PLOF: Independent.  [place], mowing.  Enjoys seeing grandson play baseball.  PATIENT GOALS: To get back to normal  OBJECTIVE:    TODAY'S TREATMENT: *** Activity Comments                       TODAY'S TREATMENT: 12/24/2022 Activity Comments  Vitals 134/76, HR 66 bpm  Convergence activity, using target approx 36 inches away, PT assisting to bring closer Becomes double approx 6 in closer  Seated head turns to target, head nods to target 3/10 symptoms  Sit<>stand x 10 reps Cues for increased hinge at hips for improved ease of transfer, cues for visual targets upon standing  Rhythmic stabilization for balance in standing A/P and lateral directions for  stability in stationary standing Light UE support at walker  Gait 10 ft x 2, then 20 ft x 4, 60 ft with RW, cues for head turn to focus to target, then body turn Pt notes improved stability with use of targets     PATIENT EDUCATION: Education details: how to progress seated exercises with speed or distance of targets Person educated: Patient Education method: Explanation and Demonstration Education comprehension: verbalized understanding and returned demonstration    HOME EXERCISE PROGRAM Last updated: 12/17/22 Access Code: [email or web address] URL: [email or web address] Date: 12/17/2022 Prepared by: [facility] - Outpatient  Rehab - [facility]  Exercises - Seated March  - 1 x daily - 5 x weekly - 3 sets - 10 reps - Seated Long Arc Quad  - 1 x daily - 5 x weekly - 3 sets - 10 reps - Pencil Pushups  - 2 x daily - 7 x weekly - 1 sets - 5 reps - Sit to Stand with Counter Support  - 1 x daily - 5 x weekly - 2 sets - 10 reps - Seated Left Head Turns Vestibular Habituation  - 1 x daily - 5 x weekly - 2-3 sets - 30 sec hold - Seated Head Nod  - 1 x daily - 5 x weekly - 2-3 sets - 30 sec hold      Below measures were taken at time of initial evaluation unless otherwise specified:    VESTIBULAR ASSESSMENT   GENERAL OBSERVATION: No acute distress    SYMPTOM BEHAVIOR:   Subjective history: Recent hx of CVA and since then he feels like he is being pulled backward or to the side.  Wife (at eval) notes veering with gait.   Non-Vestibular symptoms: diplopia, migraine symptoms, and CVA 11/25/22   Type of dizziness: Imbalance (Disequilibrium) and Unsteady with head/body turns.  Feels like he is going backwards   Frequency: throughout the day   Duration: with movement   Aggravating factors: Induced by position change: sit to stand and Induced by motion: occur when walking   Relieving factors: slow movements  Progression of symptoms: unchanged   OCULOMOTOR EXAM:   Ocular  Alignment: abnormal and L eye slightly lower   Ocular ROM: No Limitations and double vision with upper motion   Spontaneous Nystagmus: absent   Gaze-Induced Nystagmus: absent   Smooth Pursuits: intact   Saccades: slow, diplopia in upward vertical direction   Convergence/Divergence: 30 inches, then 19 inches (notes blurry vision farther away)    VESTIBULAR - OCULAR REFLEX:    Slow VOR: Comment: double vision horizontal and vertical, rates dizziness as 4/10   VOR Cancellation: Comment: reports dizziness 8/10   Head-Impulse Test: NT          -------------------------------------------------------------- Objective measures below taken at the initial evaluation:  DIAGNOSTIC FINDINGS: 1. 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 2. Underlying mild age-related cerebral atrophy with moderate chronic microvascular ischemic disease.  COGNITION: Overall cognitive status: Impaired   SENSATION: Light touch: WFL and reports sometimes having tingling on left foot  COORDINATION: Decreased LLE  MUSCLE TONE: WFL BLEs  VITALS:  137/85, HR 71  POSTURE: rounded shoulders and posterior pelvic tilt  LOWER EXTREMITY ROM:   in sitting, Active ROM WFL BLEs   LOWER EXTREMITY MMT:    MMT Right Eval Left Eval  Hip flexion 4+ 4  Hip extension    Hip abduction    Hip adduction    Hip internal rotation    Hip external rotation    Knee flexion 4 3+  Knee extension 4 3+  Ankle dorsiflexion 4 3+  Ankle plantarflexion    Ankle inversion    Ankle eversion    (Blank rows = not tested)   TRANSFERS: Assistive device utilized: None  Sit to stand: CGA Stand to sit: CGA Slowed, ataxic movements with transfers GAIT: Gait pattern: step through pattern, shuffling, ataxic, and wide BOS; veers to L Distance walked: 60 ft Assistive device utilized: Environmental consultant - 2 wheeled Level of assistance: CGA Comments: Tends to vent to L  FUNCTIONAL TESTS:  5 times sit to  stand: 25.34 sec without UE support 10 meter walk test: NT Berg Balance Scale: 22/56    PATIENT SURVEYS:  FOTO Intake score 50; predicted 69  TODAY'S TREATMENT:                                                                                                                              DATE: 12/09/2022    PATIENT EDUCATION: Education details: Eval results, POC, recommendation to continue to use walker due to high fall risk Person educated: Patient and Spouse Education method: Explanation Education comprehension: verbalized understanding  HOME EXERCISE PROGRAM: Not yet initiated  GOALS: Goals reviewed with patient? Yes  SHORT TERM GOALS: Target date: 01/07/2023  Pt will be supervision with HEP for improved balance, strength, gait. Baseline: Goal status: IN PROGRESS  2.  Pt will improve 5x sit<>stand to less than or equal to 20 sec to demonstrate improved functional strength and transfer efficiency.  Baseline: 25.34 sec Goal status: IN PROGRESS  3.  Pt will improve Berg score to at least 30/56 to decrease fall risk. Baseline: 22/56 Goal status: IN PROGRESS  4.  Further vestibular testing to be completed and goal set as appropriate. Baseline:  Goal status: IN PROGRESS   LONG TERM GOALS: Target date: 02/04/2023  Pt will be supervision with HEP for improved strength, balance, gait. Baseline:  Goal status: IN PROGRESS  2.  Pt will improve 5x sit<>stand to less than or equal to 15 sec to demonstrate improved functional strength and transfer efficiency. Baseline: 25.34 sec Goal status: IN PROGRESS  3.  Pt will improve Berg score to at least 40/56 to decrease fall risk. Baseline: 22/56 Goal status: IN PROGRESS  4.  FOTO score to improve to 69 to demo improved overall functional mobility. Baseline: 50 Goal status: IN PROGRESS  5.  Further vestibular testing to be completed/goal written as appropriate. Baseline:  Goal status: IN PROGRESS  6.  Pt will ambulate at least  1000 ft indoor and outdoor surfaces, with appropriate assistive device, mod I, for outdoor and community gait. Baseline:  Goal status: IN PROGRESS  ASSESSMENT:  CLINICAL IMPRESSION:  ***Pt reports feeling off this morning, with OT (session first) reporting he was unsteady and weaker coming into clinic.  Upon discussion, visual changes associated with double vision and movement (of cars and traffic) really increase his unsteadiness and dizziness.  After brief review of his exercises in sitting, worked on functional activities of sit<>stand and gait with turns, using visual targets for compensation.  With convergence activities, pt continues to have single vision at about 36 inches and double vision comes in about 4-6 inches closer.  He does endorse that gait with turns and use of visual target helps with feeling of stability.   OBJECTIVE IMPAIRMENTS: Abnormal gait, decreased balance, decreased knowledge of use of DME, decreased mobility, difficulty walking, decreased ROM, decreased strength, decreased safety awareness, dizziness, and postural dysfunction.   ACTIVITY LIMITATIONS: carrying, lifting, bending, standing, squatting, transfers, bathing, toileting, dressing, and locomotion level  PARTICIPATION LIMITATIONS: meal prep, cleaning, laundry, community activity, and yard work  PERSONAL FACTORS: 3+ comorbidities: see PMH; also potential visual/ vestibular component to pt's balance that needs to be further assessed  are also affecting patient's functional outcome.   REHAB POTENTIAL: Good  CLINICAL DECISION MAKING: Evolving/moderate complexity  EVALUATION COMPLEXITY: Moderate  PLAN:  PT FREQUENCY: 2x/week  PT DURATION: 8 weeks plus eval  PLANNED INTERVENTIONS: Therapeutic exercises, Therapeutic activity, Neuromuscular re-education, Balance training, Gait training, Patient/Family education, Self Care, Vestibular training, Canalith repositioning, and DME instructions  PLAN FOR NEXT SESSION:  ***Progress balance and strength, gait; continue working on convergence, VOR, habituation in functional activities   Lonia Blood, PT 12/24/22 9:46 AM Phone: [phone] Fax: [phone]   [facility] at [facility] [address] [place], [place] [address] Phone # [phone] Fax # [phone]

## 2022-12-30 ENCOUNTER — Ambulatory Visit: Payer: Medicare Other | Admitting: Physical Therapy

## 2022-12-30 ENCOUNTER — Ambulatory Visit: Payer: Medicare Other | Admitting: Occupational Therapy

## 2023-01-03 ENCOUNTER — Ambulatory Visit: Payer: Medicare Other | Admitting: Physical Therapy

## 2023-01-03 ENCOUNTER — Encounter: Payer: Self-pay | Admitting: Physical Therapy

## 2023-01-03 ENCOUNTER — Ambulatory Visit: Payer: Medicare Other | Admitting: Occupational Therapy

## 2023-01-03 ENCOUNTER — Ambulatory Visit: Payer: Medicare Other | Attending: Cardiovascular Disease

## 2023-01-03 ENCOUNTER — Ambulatory Visit: Payer: Medicare Other

## 2023-01-03 DIAGNOSIS — R42 Dizziness and giddiness: Secondary | ICD-10-CM

## 2023-01-03 DIAGNOSIS — R2681 Unsteadiness on feet: Secondary | ICD-10-CM

## 2023-01-03 DIAGNOSIS — R41840 Attention and concentration deficit: Secondary | ICD-10-CM | POA: Diagnosis not present

## 2023-01-03 DIAGNOSIS — R278 Other lack of coordination: Secondary | ICD-10-CM

## 2023-01-03 DIAGNOSIS — M6281 Muscle weakness (generalized): Secondary | ICD-10-CM

## 2023-01-03 DIAGNOSIS — R41842 Visuospatial deficit: Secondary | ICD-10-CM

## 2023-01-03 DIAGNOSIS — I639 Cerebral infarction, unspecified: Secondary | ICD-10-CM | POA: Diagnosis not present

## 2023-01-03 DIAGNOSIS — R1310 Dysphagia, unspecified: Secondary | ICD-10-CM

## 2023-01-03 DIAGNOSIS — R2689 Other abnormalities of gait and mobility: Secondary | ICD-10-CM

## 2023-01-03 DIAGNOSIS — R471 Dysarthria and anarthria: Secondary | ICD-10-CM

## 2023-01-03 DIAGNOSIS — R41841 Cognitive communication deficit: Secondary | ICD-10-CM

## 2023-01-03 LAB — CUP PACEART REMOTE DEVICE CHECK
Date Time Interrogation Session: 20240428213013
Implantable Pulse Generator Implant Date: 20240325

## 2023-01-03 NOTE — Therapy (Signed)
OUTPATIENT OCCUPATIONAL THERAPY NEURO  Treatment Note  [name] MRN: [id number] DOB:11-Feb-1951, 72 y.o., male Today's Date: 01/03/2023  PCP: Antony Haste REFERRING PROVIDER: Lynden Oxford   END OF SESSION:  OT End of Session - 01/03/23 1152     Visit Number 6    Number of Visits 17    Date for OT Re-Evaluation 02/07/23    Authorization Type [employer]    Progress Note Due on Visit 10    OT Start Time 1146    OT Stop Time 1230    OT Time Calculation (min) 44 min    Behavior During Therapy WFL for tasks assessed/performed                  Past Medical History:  Diagnosis Date   Diabetes mellitus without complication (HCC)    Migraine    Past Surgical History:  Procedure Laterality Date   BACK SURGERY     GIVENS CAPSULE STUDY N/A 05/05/2015   Procedure: GIVENS CAPSULE STUDY;  Surgeon: Charna Elizabeth, MD;  Location: [facility];  Service: Endoscopy;  Laterality: N/A;   LOOP RECORDER INSERTION N/A 11/29/2022   Procedure: LOOP RECORDER INSERTION;  Surgeon: Maurice Small, MD;  Location: [facility] INVASIVE CV LAB;  Service: Cardiovascular;  Laterality: N/A;   Patient Active Problem List   Diagnosis Date Noted   Essential hypertension 11/30/2022   Migraine 11/30/2022   Chronic kidney disease, stage 3a (HCC) 11/30/2022   Controlled type 2 diabetes mellitus without complication, without long-term current use of insulin (HCC) 11/30/2022   HLD (hyperlipidemia) 11/30/2022   Acute CVA (cerebrovascular accident) (HCC) 11/25/2022    ONSET DATE: 11/25/22  REFERRING DIAG: R MCA CVA  THERAPY DIAG:  Other lack of coordination  Visuospatial deficit  Unsteadiness on feet  Muscle weakness (generalized)  Dizziness and giddiness  Rationale for Evaluation and Treatment: Rehabilitation  SUBJECTIVE:   SUBJECTIVE STATEMENT: Patient reports trying to toss/catch tennis ball with granddaughter, but reports unable to visually track ball once it left her  hand.   Pt accompanied by: significant other  PERTINENT HISTORY: DM II, Neuropathy, CKD, Migraine  PRECAUTIONS: Fall  WEIGHT BEARING RESTRICTIONS: No  PAIN:  Are you having pain? Yes: NPRS scale: 4/10 Pain location: dull headache Aggravating factors: fatigue Relieving factors: tylenol  FALLS: Has patient fallen in last 6 months? Yes. Number of falls 1  LIVING ENVIRONMENT: Lives with: lives with their spouse Lives in: House/apartment Has following equipment at home:  Has tons of adaptive equipment from prior family members, uses shower seat, walker, has a cane, handicapped height toilet  PLOF: Independent with basic ADLs  PATIENT GOALS: feel like myself again  OBJECTIVE:   HAND DOMINANCE: Right  ADLs: Overall ADLs: supervision Transfers/ambulation related to ADLs: walks with supervision to min assist with RW Eating: Independent Grooming: supervision UB Dressing: independent LB Dressing: supervision Toileting: supervision Bathing: supervision Tub Shower transfers: close supervision Equipment: Shower seat with back and handicapped height toilet  IADLs: Shopping: NT Light housekeeping: Not participating at this time Meal Prep: Not participating at this time Community mobility: Not driving Medication management: wife Banker: wife manages Handwriting:  NT  MOBILITY STATUS: difficulty carrying objections with ambulation  POSTURE COMMENTS:  No Significant postural limitations   ACTIVITY TOLERANCE: Activity tolerance: Patient has difficulty with fatigue - reports not sleeping well at night, feels tired during the day  FUNCTIONAL OUTCOME MEASURES: FOTO: 63.75  UPPER EXTREMITY ROM:    Active ROM Right  eval Left eval  Shoulder flexion [facility]  Shoulder abduction THRUOUT THRUOUT  Shoulder adduction    Shoulder extension    Shoulder internal rotation    Shoulder external rotation    Elbow flexion    Elbow extension    Wrist flexion     Wrist extension    Wrist ulnar deviation    Wrist radial deviation    Wrist pronation    Wrist supination    (Blank rows = not tested)  UPPER EXTREMITY MMT:     MMT Right eval Left eval  Shoulder flexion 4/5 4+/5  Shoulder abduction 4+/5 THRUOUT  Shoulder adduction THRUOUT   Shoulder extension    Shoulder internal rotation    Shoulder external rotation    Middle trapezius    Lower trapezius    Elbow flexion    Elbow extension    Wrist flexion    Wrist extension    Wrist ulnar deviation    Wrist radial deviation    Wrist pronation    Wrist supination    (Blank rows = not tested)  HAND FUNCTION: Grip strength: Right: 80 lbs; Left: 76 lbs and Lateral pinch: Right: 20 lbs, Left: 20 lbs  COORDINATION: Finger Nose Finger test: undershooting consistently with LUE 9 Hole Peg test: Right: 29.68 sec; Left: 36.25 sec  SENSATION: Light touch: Impaired  Stereognosis: Not tested Proprioception: WFL  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 Wife reports he was initially very confused - even hallucinating.  Improving but she reports he often is off topic in conversation  VISION: Subjective report: reports change in peripheral vision Baseline vision: Wears glasses for reading only Visual history: cataracts, retinal issues in left eye  VISION ASSESSMENT: Impaired Eye alignment: Impaired: mild Ocular ROM: impaired Tracking/Visual pursuits: Decreased smoothness with horizontal tracking and Decreased smoothness with vertical tracking Saccades: undershoots and decreased speed of saccadic movements Convergence: Impaired:   Visual Fields: Left visual field deficits Diplopia assessment: present in Left gaze needs further testing - seems more apparent vertically.  Need to  differentiate between gaze stabilization and diploia  Patient has difficulty with following activities due to following visual impairments: reading, navigating environment  PERCEPTION: Impaired: Spatial orientation: posterior bias  PRAXIS: WFL     TODAY'S TREATMENT:                                                              01/03/23 NMR: engaged in placing progressively smaller pegs into pegboard with LUE with focus on visual attention and focus on making eyes focus on object to make single vision.  Pt reports double vision at midline and at all ranges on arc pegboard.  Progressed coordination aspect to picking up pegs one at a time to place into palm of hand and translate back to fingers tips to place in pegboard.   Vision: engaged in reaching to place peg in cone at midline with focus on making cone remain in single vision.  Pt with difficulty with changing visual attention between items at table top and back to cone at midline (around arms length). OT challenged pt  to look down at pegs and then back up to cone to challenge ability to change focus between two items.  Pt with difficulty with alternating visual attention and returning items to single vision. Attempted to move target from midline, pt with increased double vision L > R of midline. Vision: Provided with neuro-ophthalmologist contacts for persistent visual impairments.  Encouraged pt to reach out for further assessment and treatment.    12/24/22 Coordination: engaged in picking up large jacks and placing in matching colored cones with LUE.  Pt demonstrating good motor control when picking up and with reaching to place in cones across midline and outside BOS. Pt demonstrating good success when placing in cone from a visual standpoint.   Vision: engaged in reaching to place jack in cone at midline with focus on making cone remain in single vision.  Pt with difficulty with changing visual attention between items at table top and back  to cone at midline (around arms length) therefore pt maintaining visual attention on cone and relying on sensation when reaching for jacks.  OT challenged pt to look down at jacks and then back up to cone to challenge ability to change focus between two items.  Pt with difficulty with alternating visual attention and returning items to single vision.     12/22/22 Visual scanning: engaged in Spot It activity with focus on scanning vertically and horizontally to identify matching pictures on cards with multiple pictures.  Pt reports increased difficulty with scanning horizontally.  Increased challenge to field of 9 with instruction to locate 3 matching pictures thus challenging visual scanning in more spontaneous pattern. Bell cancellation test: pt completed in 4:30, demonstrating increased distraction as task continued - pt quite verbose.  Pt omiting 4 bells at midline and in R visual field.        PATIENT EDUCATION: Education details: ongoing condition specific education Person educated: Patient Education method: Explanation Education comprehension: needs further education  HOME EXERCISE PROGRAM: Coordination handout  Access Code: [email or web address] URL: [email or web address] Date: 12/15/2022 Prepared by: [facility]  Exercises - Seated Horizontal Smooth Pursuit  - 2 x daily - 7 x weekly - 1 sets - 5 reps   GOALS: Goals reviewed with patient? No  SHORT TERM GOALS: Target date: 01/07/23  Patient will complete home exercise program designed to improve coordination in left UE Goal status: IN PROGRESS  2.  Patient will complete an HEP designed to improve visual motor skills  Goal status: IN PROGRESS  3.  Patient will demonstrate improved awareness of visual management strategies for diplopia, and gaze stabilization.  Goal status: IN PROGRESS  4.  Patient will complete a shower with distant supervision  Goal status: IN PROGRESS  5.   Patient will reach to floor to obtain lightweight object to put on overhead shelf without loss of balance.    Goal status: IN PROGRESS   LONG TERM GOALS: Target date: 02/07/23  Patient will return to simple cooking - hot meal for he and his wife with intermittent min assistance and supervision  Goal status: IN PROGRESS  2.  Patient will read news article in increased font (14+) x 5 min with 100% comprehension   Goal status: IN PROGRESS  3.  Patient will demonstrate awareness of return to driving recommendations  Goal status: IN PROGRESS  4.  Patient will report effective simple hand tool use for simple household repairs  Goal status: IN PROGRESS  5.  Patient will demonstrate  at least 3 second improvement in 9 hole peg test LUE  Goal status: IN PROGRESS  6.  Patient will safely return to simple outside maintenance - gardening, mowing with supervision  Goal status: IN PROGRESS  ASSESSMENT:  CLINICAL IMPRESSION: Pt required increased time to come up to standing and regain bearings before ambulating from waiting room to treatment gym.  OT providing close CGA for ambulation with SPC.  Pt continues to report double vision with table top tasks and attempts at tossing ball with granddaughter over the weekend.  Pt with difficulty obtaining single vision during table top tasks and with inability to obtain single vision in L visual field at any range.  OT provided pt with contact information for neuro ophthalmologist and encouraged pt to have spouse call to get additional support for persistent visual impairments.  PERFORMANCE DEFICITS: in functional skills including ADLs, IADLs, coordination, sensation, strength, pain, Fine motor control, Gross motor control, mobility, balance, endurance, decreased knowledge of use of DME, vision, and UE functional use, cognitive skills including attention, energy/drive, memory, problem solving, safety awareness, and sequencing, and psychosocial skills  including habits and routines and behaviors.   IMPAIRMENTS: are limiting patient from ADLs, IADLs, and leisure.   CO-MORBIDITIES: may have co-morbidities  that affects occupational performance. Patient will benefit from skilled OT to address above impairments and improve overall function.  MODIFICATION OR ASSISTANCE TO COMPLETE EVALUATION: Min-Moderate modification of tasks or assist with assess necessary to complete an evaluation.  OT OCCUPATIONAL PROFILE AND HISTORY: Detailed assessment: Review of records and additional review of physical, cognitive, psychosocial history related to current functional performance.  CLINICAL DECISION MAKING: Moderate - several treatment options, min-mod task modification necessary  REHAB POTENTIAL: Good  EVALUATION COMPLEXITY: Moderate    PLAN:  OT FREQUENCY: 2x/week  OT DURATION: 8 weeks  PLANNED INTERVENTIONS: self care/ADL training, therapeutic exercise, therapeutic activity, neuromuscular re-education, balance training, functional mobility training, patient/family education, cognitive remediation/compensation, visual/perceptual remediation/compensation, and DME and/or AE instructions  RECOMMENDED OTHER SERVICES: Possibly Neuro-optometry  CONSULTED AND AGREED WITH PLAN OF CARE: Patient and family member/caregiver  PLAN FOR NEXT SESSION: Need further visual motor assessment.  He reports at end of eval that he has significant dizziness to the point of vomiting.  Has had one fall in reaching to obtain item from floor.  Needs stand balance training - dynamic.  Needs env't scanning, and gaze stabilization/ eye teaming activity.  Provide with neuro-ophthalmologist contacts for persistent visual impairments.   Rosalio Loud, OT 01/03/2023, 11:52 AM

## 2023-01-03 NOTE — Therapy (Signed)
OUTPATIENT PHYSICAL THERAPY NEURO TREATMENT   [name] MRN: [id number] DOB:[birth year], 72 y.o., male Today's Date: 01/04/2023   PCP: [name], MD REFERRING PROVIDER: [name], MD >to send to PCP, [name]  END OF SESSION:  PT End of Session - 01/03/23 1240     Visit Number 6    Number of Visits 17    Date for PT Re-Evaluation 02/04/23    Authorization Type [employer] Medicare    Progress Note Due on Visit 10    PT Start Time 1239    PT Stop Time 1318    PT Time Calculation (min) 39 min    Equipment Utilized During Treatment Gait belt    Activity Tolerance Patient tolerated treatment well    Behavior During Therapy WFL for tasks assessed/performed               Past Medical History:  Diagnosis Date   Diabetes mellitus without complication (HCC)    Migraine    Past Surgical History:  Procedure Laterality Date   BACK SURGERY     GIVENS CAPSULE STUDY N/A 05/05/2015   Procedure: GIVENS CAPSULE STUDY;  Surgeon: Charna Elizabeth, MD;  Location: [facility] ENDOSCOPY;  Service: Endoscopy;  Laterality: N/A;   LOOP RECORDER INSERTION N/A 11/29/2022   Procedure: LOOP RECORDER INSERTION;  Surgeon: Maurice Small, MD;  Location: [facility] INVASIVE CV LAB;  Service: Cardiovascular;  Laterality: N/A;   Patient Active Problem List   Diagnosis Date Noted   Essential hypertension 11/30/2022   Migraine 11/30/2022   Chronic kidney disease, stage 3a (HCC) 11/30/2022   Controlled type 2 diabetes mellitus without complication, without long-term current use of insulin (HCC) 11/30/2022   HLD (hyperlipidemia) 11/30/2022   Acute CVA (cerebrovascular accident) (HCC) 11/25/2022    ONSET DATE: 11/25/2022  REFERRING DIAG: I63.9 (ICD-10-CM) - Cerebrovascular accident (CVA), unspecified mechanism   THERAPY DIAG:  Muscle weakness (generalized)  Unsteadiness on feet  Other abnormalities of gait and mobility  Rationale for Evaluation and Treatment:  Rehabilitation  SUBJECTIVE:                                                                                                                                                                                             SUBJECTIVE STATEMENT: Had a bad week last week.  Was just out of it.  Still have some dizziness upon first getting up and going.  Wanted to transition to the cane.    Pt accompanied by: self  PERTINENT HISTORY: a-fib, DM, hx of migraines, hx of back surgery, multiple knee surgeries (per pt report)  PAIN:  Are  you having pain? Yes: NPRS scale: 5/10 Pain location: headache Pain description: headache Aggravating factors: sunlight, noise Relieving factors: darkness, quietness  PRECAUTIONS: Fall and Other: loop recorder; requires supervision at all times  WEIGHT BEARING RESTRICTIONS: No  FALLS: Has patient fallen in last 6 months? Yes. Number of falls 1 with his initial CVA  LIVING ENVIRONMENT: Lives with: lives with their spouse Lives in: House/apartment Stairs: Yes: External: 3 steps; on right going up.  Home has basement, but he doesn't go down into basement. Has following equipment at home: Single point cane, Walker - 2 wheeled, and bed side commode  PLOF: Independent.  [place], mowing.  Enjoys seeing grandson play baseball.  PATIENT GOALS: To get back to normal  OBJECTIVE:    TODAY'S TREATMENT: 01/03/2023 Activity Comments  Sit<>stand x 10 reps from mat Hands on knees/mat, slowed and guarded  5x sit<>stand:  35.90 sec without UE support  Good form overall, decreased eccentric control last 2 reps  Berg 34/56 Improved from 22/56  Gait training with cane, 85 ft x 3 reps Initial cues for gait sequencing  Vitals at end of session:  136/85   At counter:  Romberg stance, tandem stance, SLS UE support         PATIENT EDUCATION: Education details: progress towards goals-improved balance, still at fall risk; use cane for short  distances, RW still for long distances. Person educated: Patient Education method: Explanation and Demonstration Education comprehension: verbalized understanding and returned demonstration    HOME EXERCISE PROGRAM Last updated: 12/17/22 Access Code: [email or web address] URL: [email or web address] Date: 12/17/2022 Prepared by: [facility] - Outpatient  Rehab - [facility]  Exercises - Seated March  - 1 x daily - 5 x weekly - 3 sets - 10 reps - Seated Long Arc Quad  - 1 x daily - 5 x weekly - 3 sets - 10 reps - Pencil Pushups  - 2 x daily - 7 x weekly - 1 sets - 5 reps - Sit to Stand with Counter Support  - 1 x daily - 5 x weekly - 2 sets - 10 reps - Seated Left Head Turns Vestibular Habituation  - 1 x daily - 5 x weekly - 2-3 sets - 30 sec hold - Seated Head Nod  - 1 x daily - 5 x weekly - 2-3 sets - 30 sec hold      Below measures were taken at time of initial evaluation unless otherwise specified:    VESTIBULAR ASSESSMENT   GENERAL OBSERVATION: No acute distress    SYMPTOM BEHAVIOR:   Subjective history: Recent hx of CVA and since then he feels like he is being pulled backward or to the side.  Wife (at eval) notes veering with gait.   Non-Vestibular symptoms: diplopia, migraine symptoms, and CVA 11/25/22   Type of dizziness: Imbalance (Disequilibrium) and Unsteady with head/body turns.  Feels like he is going backwards   Frequency: throughout the day   Duration: with movement   Aggravating factors: Induced by position change: sit to stand and Induced by motion: occur when walking   Relieving factors: slow movements   Progression of symptoms: unchanged   OCULOMOTOR EXAM:   Ocular Alignment: abnormal and L eye slightly lower   Ocular ROM: No Limitations and double vision with upper motion   Spontaneous Nystagmus: absent   Gaze-Induced Nystagmus: absent   Smooth Pursuits: intact   Saccades: slow, diplopia in upward vertical direction   Convergence/Divergence: 30  inches, then 19 inches (notes blurry vision farther away)    VESTIBULAR - OCULAR REFLEX:    Slow VOR: Comment: double vision horizontal and vertical, rates dizziness as 4/10   VOR Cancellation: Comment: reports dizziness 8/10   Head-Impulse Test: NT          -------------------------------------------------------------- Objective measures below taken at the initial evaluation:  DIAGNOSTIC FINDINGS: 1. 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 2. Underlying mild age-related cerebral atrophy with moderate chronic microvascular ischemic disease.  COGNITION: Overall cognitive status: Impaired   SENSATION: Light touch: WFL and reports sometimes having tingling on left foot  COORDINATION: Decreased LLE  MUSCLE TONE: WFL BLEs  VITALS:  137/85, HR 71  POSTURE: rounded shoulders and posterior pelvic tilt  LOWER EXTREMITY ROM:   in sitting, Active ROM WFL BLEs   LOWER EXTREMITY MMT:    MMT Right Eval Left Eval  Hip flexion 4+ 4  Hip extension    Hip abduction    Hip adduction    Hip internal rotation    Hip external rotation    Knee flexion 4 3+  Knee extension 4 3+  Ankle dorsiflexion 4 3+  Ankle plantarflexion    Ankle inversion    Ankle eversion    (Blank rows = not tested)   TRANSFERS: Assistive device utilized: None  Sit to stand: CGA Stand to sit: CGA Slowed, ataxic movements with transfers GAIT: Gait pattern: step through pattern, shuffling, ataxic, and wide BOS; veers to L Distance walked: 60 ft Assistive device utilized: Environmental consultant - 2 wheeled Level of assistance: CGA Comments: Tends to vent to L  FUNCTIONAL TESTS:  5 times sit to stand: 25.34 sec without UE support 10 meter walk test: NT Berg Balance Scale: 22/56  [facility] PT Assessment - 01/03/23 0001       Berg Balance Test   Sit to Stand Able to stand  independently using hands    Standing Unsupported Able to stand safely 2 minutes    Sitting with Back Unsupported  but Feet Supported on Floor or Stool Able to sit safely and securely 2 minutes    Stand to Sit Controls descent by using hands    Transfers Able to transfer safely, definite need of hands    Standing Unsupported with Eyes Closed Able to stand 10 seconds with supervision    Standing Unsupported with Feet Together Able to place feet together independently and stand for 1 minute with supervision    From Standing, Reach Forward with Outstretched Arm Reaches forward but needs supervision    From Standing Position, Pick up Object from Floor Able to pick up shoe, needs supervision    From Standing Position, Turn to Look Behind Over each Shoulder Turn sideways only but maintains balance    Turn 360 Degrees Needs close supervision or verbal cueing    Standing Unsupported, Alternately Place Feet on Step/Stool Able to complete >2 steps/needs minimal assist    Standing Unsupported, One Foot in Front Able to take small step independently and hold 30 seconds    Standing on One Leg Tries to lift leg/unable to hold 3 seconds but remains standing independently    Total Score 34    Berg comment: improved from 22/56              PATIENT SURVEYS:  FOTO Intake score 50; predicted 69  TODAY'S TREATMENT:  DATE: 12/09/2022    PATIENT EDUCATION: Education details: Eval results, POC, recommendation to continue to use walker due to high fall risk Person educated: Patient and Spouse Education method: Explanation Education comprehension: verbalized understanding  HOME EXERCISE PROGRAM: Not yet initiated  GOALS: Goals reviewed with patient? Yes  SHORT TERM GOALS: Target date: 01/07/2023  Pt will be supervision with HEP for improved balance, strength, gait. Baseline: Goal status: IN PROGRESS  2.  Pt will improve 5x sit<>stand to less than or equal to 20 sec to demonstrate improved  functional strength and transfer efficiency.  Baseline: 25.34 sec Goal status: IN PROGRESS  3.  Pt will improve Berg score to at least 30/56 to decrease fall risk. Baseline: 22/56>34/56 Goal status: MET  4.  Further vestibular testing to be completed and goal set as appropriate. Baseline:  Goal status: IN PROGRESS   LONG TERM GOALS: Target date: 02/04/2023  Pt will be supervision with HEP for improved strength, balance, gait. Baseline:  Goal status: IN PROGRESS  2.  Pt will improve 5x sit<>stand to less than or equal to 15 sec to demonstrate improved functional strength and transfer efficiency. Baseline: 25.34 sec Goal status: IN PROGRESS  3.  Pt will improve Berg score to at least 40/56 to decrease fall risk. Baseline: 22/56 Goal status: IN PROGRESS  4.  FOTO score to improve to 69 to demo improved overall functional mobility. Baseline: 50 Goal status: IN PROGRESS  5.  Further vestibular testing to be completed/goal written as appropriate. Baseline:  Goal status: IN PROGRESS  6.  Pt will ambulate at least 1000 ft indoor and outdoor surfaces, with appropriate assistive device, mod I, for outdoor and community gait. Baseline:  Goal status: IN PROGRESS  ASSESSMENT:  CLINICAL IMPRESSION:  Pt arrives today using cane.  He reports he has been working to transition to this at home.  Began to assess STGs, with Berg Balance test improving to 34/56 from 20/56 at eval.  Gait training provided with cane, including raising height of cane and instructing on proper cane sequence.  He requires supervision and fatigues with multiple laps of gait in clinic.  Instructed pt to trial cane at home in familiar spaces and use RW for longer distance, community distances.  He will continue to benefit from skilled PT towards goals. OBJECTIVE IMPAIRMENTS: Abnormal gait, decreased balance, decreased knowledge of use of DME, decreased mobility, difficulty walking, decreased ROM, decreased strength,  decreased safety awareness, dizziness, and postural dysfunction.   ACTIVITY LIMITATIONS: carrying, lifting, bending, standing, squatting, transfers, bathing, toileting, dressing, and locomotion level  PARTICIPATION LIMITATIONS: meal prep, cleaning, laundry, community activity, and yard work  PERSONAL FACTORS: 3+ comorbidities: see PMH; also potential visual/ vestibular component to pt's balance that needs to be further assessed  are also affecting patient's functional outcome.   REHAB POTENTIAL: Good  CLINICAL DECISION MAKING: Evolving/moderate complexity  EVALUATION COMPLEXITY: Moderate  PLAN:  PT FREQUENCY: 2x/week  PT DURATION: 8 weeks plus eval  PLANNED INTERVENTIONS: Therapeutic exercises, Therapeutic activity, Neuromuscular re-education, Balance training, Gait training, Patient/Family education, Self Care, Vestibular training, Canalith repositioning, and DME instructions  PLAN FOR NEXT SESSION: Add to HEP:  tandem stance, SLS, Romberg.  Progress balance and strength, gait with cane; continue working on convergence, VOR, habituation in functional activities   Lonia Blood, PT 01/04/23 7:47 AM Phone: [phone] Fax: [phone]   [facility] at [facility] [address] [place], [place] [address] Phone # [phone] Fax # [phone]

## 2023-01-03 NOTE — Therapy (Signed)
OUTPATIENT SPEECH LANGUAGE PATHOLOGY TREATMENT   [name] MRN: [id number] DOB:Dec 01, 1950, 72 y.o., male Today's Date: 01/03/2023  PCP: Antony Haste, MD REFERRING PROVIDER: [name], MD  Antony Haste MD - doc)  END OF SESSION:  End of Session - 01/03/23 1325     Visit Number 5    Number of Visits 25    Date for SLP Re-Evaluation 03/09/23    SLP Start Time 1322    SLP Stop Time  1402    SLP Time Calculation (min) 40 min    Activity Tolerance Patient tolerated treatment well             Past Medical History:  Diagnosis Date   Diabetes mellitus without complication (HCC)    Migraine    Past Surgical History:  Procedure Laterality Date   BACK SURGERY     GIVENS CAPSULE STUDY N/A 05/05/2015   Procedure: GIVENS CAPSULE STUDY;  Surgeon: Charna Elizabeth, MD;  Location: [facility] ENDOSCOPY;  Service: Endoscopy;  Laterality: N/A;   LOOP RECORDER INSERTION N/A 11/29/2022   Procedure: LOOP RECORDER INSERTION;  Surgeon: Maurice Small, MD;  Location: [facility] INVASIVE CV LAB;  Service: Cardiovascular;  Laterality: N/A;   Patient Active Problem List   Diagnosis Date Noted   Essential hypertension 11/30/2022   Migraine 11/30/2022   Chronic kidney disease, stage 3a (HCC) 11/30/2022   Controlled type 2 diabetes mellitus without complication, without long-term current use of insulin (HCC) 11/30/2022   HLD (hyperlipidemia) 11/30/2022   Acute CVA (cerebrovascular accident) (HCC) 11/25/2022    ONSET DATE: 11/25/22   REFERRING DIAG: CVA  THERAPY DIAG:  Cognitive communication deficit  Dysarthria and anarthria  Dysphagia, unspecified type  Rationale for Evaluation and Treatment: Rehabilitation  SUBJECTIVE:   SUBJECTIVE STATEMENT: Pt reports coughing with tea Sunday 12/19/22, and once today with water. Pt accompanied by: self  PERTINENT HISTORY: presented 11/25/22 with AMS, blurred vision, headache, and L-sided numbness and weakness. MRI revealed patchy  small volume acute ischemic posterior right MCA territory infarct. PMH: DM, migraine  PAIN:  Are you having pain? Yes: NPRS scale: 510 Pain location: head Pain description: headache Aggravating factors: activity Relieving factors: rest  PATIENT GOALS: Improve communication skills  OBJECTIVE:   DIAGNOSTIC FINDINGS:  BSE 11/26/22 Clinical Impression   Pt reports no swallowing difficulties PTA. He has L side neglect that affects his ability to attend to POs presented on that side and appropriately raise them to his mouth. Oral motor exam revealed L side asymmetry and mild difficulty coodinating some movements. Pt exhibited difficulty forming a labial seal given thin liquids via straw and purees via spoon as well as oral holding during trials of solid textures. He had no observed s/s of aspiration throughout PO trials. Due to pt's awareness deficits and overall mentation, recommend Dys 2 (finely chopped) and thin liquids with full supervision. SLP will f/u as able to address potential for trials of upgraded textures as mentation improves.    COGNITION: Overall cognitive status: Impaired Areas of impairment:  Attention: Impaired: Sustained, Selective, Alternating, Divided Memory: Impaired: Short term Executive function: Impaired: Slow processing Functional deficits: Pt states he is talking and then loses train of thought often. He is no longer managing his medication (see "S" statement) nor his appointments. "If I had a calendar (in the kitchen) it would help." Formal cognitive assessment may be warranted in the next few sessions.   AUDITORY COMPREHENSION: Overall auditory comprehension: Impaired: inasmuch as attention plays a role  YES/NO questions: Appears intact Following directions:  affected inasmuch as attention is affected Conversation: Complex and Moderately Complex pt with slower response time than expected Interfering components: attention and pain(?) Effective technique: extra  processing time  READING COMPREHENSION: Impaired: sentence and paragraph; Reports that he needs "extra concentration" to read anything - deficit likely due to attention  EXPRESSION: verbal  VERBAL EXPRESSION: Level of generative/spontaneous verbalization: conversation Repetition: Appears intact Naming: Confrontation: [employer] Test-2: 51/60 (WNL) Pragmatics: Appears intact Comments: Pt reports "tip of the tongue" syndrome occasionally. SLP believes this is due to cognitive deficits as BNT-2 score was above the mean and within WNL. Anomia was not seen today in conversation during evaluation. Interfering components: attention  WRITTEN EXPRESSION: Dominant hand: right Written expression: Not tested  MOTOR SPEECH: Overall motor speech: Articulation was intact during evaluation, however low vocal intensity was observed - unsure if this is due to pain (HA) or muscle weakness Level of impairment: Sentence and Conversation Respiration: diaphragmatic/abdominal breathing Phonation: normal and low vocal intensity (sub 70dB) Resonance: WFL Articulation: Appears intact currently however wife and pt state pt "mumbles" when fatigued Intelligibility: Intelligible Motor planning: Appears intact  ORAL MOTOR EXAMINATION: Overall status: Impaired:   Labial: Left (Strength) Lingual: Left (ROM, Strength, and Coordination) Velum: ROM   STANDARDIZED ASSESSMENTS: CLQT: Attention: WNL, Memory: Mild, Executive Function: WNL, Language: WNL, Visuospatial Skills: WNL, and Clock Drawing: Mild Pt scored under the "cut score" for three subtests: clock drawing, mazes, and design generation. These subtests do rely somewhat on vision -which, at this time pt complains of "double-vision". Anecdotally, pt has some sx of focused attention deficits such as overly verbose at times and losing track of his point, or the topic, and exhibiting rambling speech. SLP to cont to monitor.    PATIENT REPORTED OUTCOME  MEASURES (PROM): Cognitive Function:   and Communication Effectiveness Survey: provided 01/03/23 with instructions to return next session   TODAY'S TREATMENT:                                                                                                                                         DATE:  01/03/23: SLP completed CLQT, with results as above. Pt began session with 5 1/4 minute explanation about why he had experience with a cane, demonstrating possible deficit with focused or internal selective attention.  Pt's speech volume was measured as WNL today in 12 minutes of focused conversation (including initial conversation about pt's cane experiences). PT without anomia today in this session.  12/22/22: Two coughing episodes since last session - one Sunday on tea and one today with water. SLP to continue to track these. Pt without overt s/sx aspiration PNA today. SLP cont'd with pt's dysarthria HEP today and he req'd min-mod A usually for stressing/strong lingual sounds on the HEP, and for procedure. Pt stated he was performing HEP once/day.  CLQT initiated today. By end of session (  15 minutes with CLQT) pt's HA increased in pain from 4/10 to 6/10. During the last 15 minutes of session (during CLQT) pt demonstrated word finding difficulty x2; Extra time (<3 seconds) necessary for pt to be successful. Prior to this, pt without difficulty in min-mod complex conversation.   12/17/22: Perform CLQT next session. Pt took medicines this morning and he did them WNL. MD visit yesterday brought new meds and deleted some others and pt to pick those up today.  SLP engaged pt in salient min-mod complex conversation and pt had 4 anomic errors which he compensated for. SLP provided him homework for word finding.  12/15/22: Pt did not bring in cough log - forgot he was supposed to do this. SLP made a rudimentary log for pt to take. SLP discussed his medication administration with him today and see pt instructions  for the result of this conversation. SLP educated how pain can interfere with rehab treatment and encouraged pt to take pain reliever if necessary prior to rehab appointments.  Pt with dysnomia (Daughter instead of daughter in law, and carport instead of garage) which were both corrected by pt.   12/09/22 (eval): SLP went through portion of pt's HEP for oral strengthening. Full program will need to be completed with pt next time.  PATIENT EDUCATION: Education details: Eval results, HEP for lingual weakness and labial weakness Person educated: Patient and Spouse Education method: Explanation, Demonstration, Verbal cues, and Handouts Education comprehension: verbalized understanding, returned demonstration, verbal cues required, and needs further education   GOALS: Goals reviewed with patient? Yes  SHORT TERM GOALS: Target date: 01/16/23  Pt will report improved communication (speech intelligibility) after dinner between 3 sessions Baseline: Goal status: Ongoing  2.  Pt will bring cough log to two of first 4 sessions; pt will be referred for MBS if clinically warranted Baseline: 12/22/22 Goal status: Ongoing  3.  Pt will undergo cognitive assessment in first 3 sessions Baseline:  Goal status: Met  4.  Pt speech volume in 5 minutes simple conversation will improve to WNL in 3 sessions Baseline: 01/03/23 Goal status: Ongoing   LONG TERM GOALS: Target date: 03/09/23  Pt's PROM measures will increase compared to first measurement during first therapy session Baseline:  Goal status: Ongoing  2.  Pt speech volume in 10 minutes mod complex conversation will improve to WNL in 3 sessions Baseline:  Goal status: Ongoing  3.  Pt will report improved communication (speech intelligibility) after dinner between 3 sessions (after 01/16/23) Baseline:  Goal status: Ongoing    ASSESSMENT:  CLINICAL IMPRESSION: Patient is a 72 y.o. male who is seen in ST for speech intelligibility and  cognitive communication abilities following a CVA 11-25-22. He may require a MBS. SLP completed cognitive communication assessment today with results as above. He reported anomia in conversation during evaluation however SLP did not appreciate anomia during evaluation and pt scored WNL on [place] Naming Test - anomic-like incidents may be due to reduced processing ability. Today Chuck had 2 anomic episodes in 15 minutes while engaged with CLQT. Suspect word finding with cognitive/processing basis rather than a language deficit.  OBJECTIVE IMPAIRMENTS: include attention, memory, expressive language, receptive language, and dysarthria. These impairments are limiting patient from managing medications, managing appointments, household responsibilities, ADLs/IADLs, effectively communicating at home and in community, and safety when swallowing. Factors affecting potential to achieve goals and functional outcome are  none noted today . Patient will benefit from skilled SLP services to address above impairments and improve  overall function.  REHAB POTENTIAL: Good  PLAN:  SLP FREQUENCY: 2x/week  SLP DURATION: 12 weeks  PLANNED INTERVENTIONS: Environmental controls, Cueing hierachy, Cognitive reorganization, Internal/external aids, Oral motor exercises, Functional tasks, SLP instruction and feedback, Compensatory strategies, Patient/family education, and MBS    Avamae Dehaan, CCC-SLP 01/03/2023, 1:26 PM

## 2023-01-05 ENCOUNTER — Ambulatory Visit: Payer: Medicare Other | Attending: Internal Medicine

## 2023-01-05 ENCOUNTER — Encounter: Payer: Self-pay | Admitting: Neurology

## 2023-01-05 ENCOUNTER — Ambulatory Visit: Payer: Medicare Other

## 2023-01-05 ENCOUNTER — Ambulatory Visit: Payer: Medicare Other | Admitting: Neurology

## 2023-01-05 ENCOUNTER — Ambulatory Visit: Payer: Medicare Other | Admitting: Occupational Therapy

## 2023-01-05 VITALS — BP 136/80 | HR 115

## 2023-01-05 VITALS — BP 130/77 | HR 69 | Ht 66.0 in | Wt 163.0 lb

## 2023-01-05 DIAGNOSIS — R278 Other lack of coordination: Secondary | ICD-10-CM | POA: Diagnosis present

## 2023-01-05 DIAGNOSIS — R42 Dizziness and giddiness: Secondary | ICD-10-CM | POA: Insufficient documentation

## 2023-01-05 DIAGNOSIS — R2681 Unsteadiness on feet: Secondary | ICD-10-CM | POA: Insufficient documentation

## 2023-01-05 DIAGNOSIS — R2689 Other abnormalities of gait and mobility: Secondary | ICD-10-CM | POA: Diagnosis present

## 2023-01-05 DIAGNOSIS — G43709 Chronic migraine without aura, not intractable, without status migrainosus: Secondary | ICD-10-CM | POA: Diagnosis not present

## 2023-01-05 DIAGNOSIS — R41841 Cognitive communication deficit: Secondary | ICD-10-CM | POA: Insufficient documentation

## 2023-01-05 DIAGNOSIS — I63411 Cerebral infarction due to embolism of right middle cerebral artery: Secondary | ICD-10-CM | POA: Diagnosis not present

## 2023-01-05 DIAGNOSIS — R208 Other disturbances of skin sensation: Secondary | ICD-10-CM | POA: Insufficient documentation

## 2023-01-05 DIAGNOSIS — R41842 Visuospatial deficit: Secondary | ICD-10-CM | POA: Insufficient documentation

## 2023-01-05 DIAGNOSIS — R471 Dysarthria and anarthria: Secondary | ICD-10-CM | POA: Diagnosis present

## 2023-01-05 DIAGNOSIS — R1310 Dysphagia, unspecified: Secondary | ICD-10-CM | POA: Insufficient documentation

## 2023-01-05 DIAGNOSIS — M6281 Muscle weakness (generalized): Secondary | ICD-10-CM | POA: Diagnosis present

## 2023-01-05 MED ORDER — AMITRIPTYLINE HCL 10 MG PO TABS: 10.0000 mg | ORAL_TABLET | Freq: Every day | ORAL | 0 refills | Status: DC

## 2023-01-05 NOTE — Therapy (Signed)
OUTPATIENT SPEECH LANGUAGE PATHOLOGY TREATMENT   [name] MRN: [id number] DOB:21-Jul-1951, 72 y.o., male Today's Date: 01/05/2023  PCP: Antony Haste, MD REFERRING PROVIDER: [name], MD  Antony Haste MD - doc)  END OF SESSION:  End of Session - 01/05/23 1526     Visit Number 6    Number of Visits 25    Date for SLP Re-Evaluation 03/09/23    SLP Start Time 1405    SLP Stop Time  1445    SLP Time Calculation (min) 40 min    Activity Tolerance Patient tolerated treatment well              Past Medical History:  Diagnosis Date   Diabetes mellitus without complication (HCC)    Migraine    Stroke [facility])    Past Surgical History:  Procedure Laterality Date   BACK SURGERY     GIVENS CAPSULE STUDY N/A 05/05/2015   Procedure: GIVENS CAPSULE STUDY;  Surgeon: Charna Elizabeth, MD;  Location: [facility] ENDOSCOPY;  Service: Endoscopy;  Laterality: N/A;   LOOP RECORDER INSERTION N/A 11/29/2022   Procedure: LOOP RECORDER INSERTION;  Surgeon: Maurice Small, MD;  Location: [facility] INVASIVE CV LAB;  Service: Cardiovascular;  Laterality: N/A;   Patient Active Problem List   Diagnosis Date Noted   Essential hypertension 11/30/2022   Migraine 11/30/2022   Chronic kidney disease, stage 3a (HCC) 11/30/2022   Controlled type 2 diabetes mellitus without complication, without long-term current use of insulin (HCC) 11/30/2022   HLD (hyperlipidemia) 11/30/2022   Acute CVA (cerebrovascular accident) (HCC) 11/25/2022    ONSET DATE: 11/25/22   REFERRING DIAG: CVA  THERAPY DIAG:  Cognitive communication deficit  Dysarthria and anarthria  Dysphagia, unspecified type  Rationale for Evaluation and Treatment: Rehabilitation  SUBJECTIVE:   SUBJECTIVE STATEMENT: No additional coughing episodes since last reported. Had vitamin D supplement yesterday and GI tract was affected all yesterday.  Pt accompanied by: self  PERTINENT HISTORY: presented 11/25/22 with AMS,  blurred vision, headache, and L-sided numbness and weakness. 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 PMH: DM, migraine  PAIN:  Are you having pain? Yes: NPRS scale: 6/10 Pain location: head Pain description: headache Aggravating factors: activity Relieving factors: rest  PATIENT GOALS: Improve communication skills  OBJECTIVE:   DIAGNOSTIC FINDINGS:  BSE 11/26/22 Clinical Impression   Pt reports no swallowing difficulties PTA. He has L side neglect that affects his ability to attend to POs presented on that side and appropriately raise them to his mouth. Oral motor exam revealed L side asymmetry and mild difficulty coodinating some movements. Pt exhibited difficulty forming a labial seal given thin liquids via straw and purees via spoon as well as oral holding during trials of solid textures. He had no observed s/s of aspiration throughout PO trials. Due to pt's awareness deficits and overall mentation, recommend Dys 2 (finely chopped) and thin liquids with full supervision. SLP will f/u as able to address potential for trials of upgraded textures as mentation improves.    COGNITION: Overall cognitive status: Impaired Areas of impairment:  Attention: Impaired: Sustained, Selective, Alternating, Divided Memory: Impaired: Short term Executive function: Impaired: Slow processing Functional deficits: Pt states he is talking and then loses train of thought often. He is no longer managing his medication (see "S" statement) nor his appointments. "If I had a calendar (in the kitchen) it would help." Formal cognitive assessment may be warranted in the next few sessions.  STANDARDIZED ASSESSMENTS: CLQT: Attention: WNL, Memory: Mild, Executive Function: WNL, Language: WNL, Visuospatial Skills: WNL, and Clock Drawing: Mild Pt scored under the "cut score" for three subtests: clock drawing, mazes, and design generation. These subtests do rely somewhat  on vision -which, at this time pt complains of "double-vision". Anecdotally, pt has some sx of focused attention deficits such as overly verbose at times and losing track of his point, or the topic, and exhibiting rambling speech. SLP to cont to monitor.    PATIENT REPORTED OUTCOME MEASURES (PROM): Cognitive Function:   and Communication Effectiveness Survey: provided 01/03/23 with instructions to return next session   TODAY'S TREATMENT:                                                                                                                                         DATE:  01/05/23: Pt did not complete PROMs , nor homework due to ill yesterday. Very dizzy upon entry to ST today due to ride to therapies today.  SLP measured pt's voice volume - average mid 60s dB. SLP inquired and pt stated wife told him today "for first time in a long time that I was mumbling." SLP educated pt about energy conservation and management; pt demonstrated understanding.  01/03/23: SLP completed CLQT, with results as above. Pt began session with 5 1/4 minute explanation about why he had experience with a cane, demonstrating possible deficit with focused or internal selective attention.  Pt's speech volume was measured as WNL today in 12 minutes of focused conversation (including initial conversation about pt's cane experiences). PT without anomia today in this session.  12/22/22: Two coughing episodes since last session - one Sunday on tea and one today with water. SLP to continue to track these. Pt without overt s/sx aspiration PNA today. SLP cont'd with pt's dysarthria HEP today and he req'd min-mod A usually for stressing/strong lingual sounds on the HEP, and for procedure. Pt stated he was performing HEP once/day.  CLQT initiated today. By end of session (15 minutes with CLQT) pt's HA increased in pain from 4/10 to 6/10. During the last 15 minutes of session (during CLQT) pt demonstrated word finding difficulty x2;  Extra time (<3 seconds) necessary for pt to be successful. Prior to this, pt without difficulty in min-mod complex conversation.   12/17/22: Perform CLQT next session. Pt took medicines this morning and he did them WNL. MD visit yesterday brought new meds and deleted some others and pt to pick those up today.  SLP engaged pt in salient min-mod complex conversation and pt had 4 anomic errors which he compensated for. SLP provided him homework for word finding.  12/15/22: Pt did not bring in cough log - forgot he was supposed to do this. SLP made a rudimentary log for pt to take. SLP discussed his medication administration with him today and see pt instructions for the result of  this conversation. SLP educated how pain can interfere with rehab treatment and encouraged pt to take pain reliever if necessary prior to rehab appointments.  Pt with dysnomia (Daughter instead of daughter in law, and carport instead of garage) which were both corrected by pt.   12/09/22 (eval): SLP went through portion of pt's HEP for oral strengthening. Full program will need to be completed with pt next time.  PATIENT EDUCATION: Education details: Eval results, HEP for lingual weakness and labial weakness Person educated: Patient and Spouse Education method: Explanation, Demonstration, Verbal cues, and Handouts Education comprehension: verbalized understanding, returned demonstration, verbal cues required, and needs further education   GOALS: Goals reviewed with patient? Yes  SHORT TERM GOALS: Target date: 01/16/23  Pt will report improved communication (speech intelligibility) after dinner between 3 sessions Baseline: Goal status: Ongoing  2.  Pt will bring cough log to two of first 4 sessions; pt will be referred for MBS if clinically warranted Baseline: 12/22/22 Goal status: Ongoing  3.  Pt will undergo cognitive assessment in first 3 sessions Baseline:  Goal status: Met  4.  Pt speech volume in 5 minutes  simple conversation will improve to WNL in 3 sessions Baseline: 01/03/23 Goal status: Ongoing   LONG TERM GOALS: Target date: 03/09/23  Pt's PROM measures will increase compared to first measurement during first therapy session Baseline:  Goal status: Ongoing  2.  Pt speech volume in 10 minutes mod complex conversation will improve to WNL in 3 sessions Baseline:  Goal status: Ongoing  3.  Pt will report improved communication (speech intelligibility) after dinner between 3 sessions (after 01/16/23) Baseline:  Goal status: Ongoing    ASSESSMENT:  CLINICAL IMPRESSION: Patient is a 72 y.o. male who is seen in ST for speech intelligibility and cognitive communication abilities following a CVA 11-25-22. Consideration of a MBS continues to be possible, depending upon cough log. SLP did not appreciate anomia during session today, and pt scored WNL on Boston Naming Test; Suspect word finding with cognitive/processing basis rather than a language deficit.  OBJECTIVE IMPAIRMENTS: include attention, memory, expressive language, receptive language, and dysarthria. These impairments are limiting patient from managing medications, managing appointments, household responsibilities, ADLs/IADLs, effectively communicating at home and in community, and safety when swallowing. Factors affecting potential to achieve goals and functional outcome are  none noted today . Patient will benefit from skilled SLP services to address above impairments and improve overall function.  REHAB POTENTIAL: Good  PLAN:  SLP FREQUENCY: 2x/week  SLP DURATION: 12 weeks  PLANNED INTERVENTIONS: Environmental controls, Cueing hierachy, Cognitive reorganization, Internal/external aids, Oral motor exercises, Functional tasks, SLP instruction and feedback, Compensatory strategies, Patient/family education, and MBS    Tyrae Alcoser, CCC-SLP 01/05/2023, 3:27 PM

## 2023-01-05 NOTE — Progress Notes (Signed)
[facility]  PATIENT: [name] DOB: 1950/11/26  REQUESTING CLINICIAN: [name], MD HISTORY FROM: Patient and spouse REASON FOR VISIT: Right MCA strokes    HISTORICAL  CHIEF COMPLAINT:  Chief Complaint  Patient presents with   New Patient (Initial Visit)    Rm 13,with wife Sean Cloud, NP/follow up hospital, stroke, dizziness when looking up,or when in car. Says has double vision in the morning.    HISTORY OF PRESENT ILLNESS:  This is a 72 year old man with history of migraines headaches, diabetes, and CKDs who is presenting after being admitted for acute right hemispheric stroke. Patient initially presented to the ED with complains of headaches, reports that nothing was right, he was throwing up, confused and also had left side weakness and could not walk.  In the ED, he was found to have a right MCA stroke, cortical strokes. Etiology likely embolic, his MRA did not show any large vessel occlusion and his EF was normal 60 to 65%. He was recommended DAPT for a total of 21 days and then aspirin thereafter. He was also recommended outpatient rehab. He was also recommended a loop recorder.  Since discharge from hospital he continues to improve physically with the help of rehab   Hospital course and summary  72 year old man with PMH of CKD 3A, type II DM, migraine, present to the hospital with complaints of left-sided weakness and dizziness.  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  Neurology was consulted.  Stroke workup initiated. Assessment and Plan  Acute right MCA stroke. Possible small vessel disease. CT head without any acute abnormality. MRI confirms acute right posterior MCA infarct. MRA shows no large vessel occlusion. Echocardiogram shows preserved EF of 60-65% without any valvular abnormality. Lower extremity Doppler negative for DVT. Carotid Doppler negative for any significant stenosis. PT OT as well as SLP evaluation recommending CIR. on  repeat evaluation patient improved significantly and therefore therapy recommended possibility of going home.  Patient will continue with outpatient neurorehab for SLP. Currently on dysphagia 2 diet. Not on any thrombophlebitic medication prior to admission.  Currently on 81 mg aspirin and 75 mg Plavix daily.  Continue for 3 weeks and then aspirin alone. Patient will require loop recorder prior to discharge.   OTHER MEDICAL CONDITIONS: CKD 3, Diabetes, Hyperlipidemia,    REVIEW OF SYSTEMS: Full 14 system review of systems performed and negative with exception of: As noted in the HPI   ALLERGIES: Allergies  Allergen Reactions   Atorvastatin Calcium Other (See Comments)   Lisinopril Swelling   Bee Venom Swelling    Swelling   Swelling   Codeine Itching    HOME MEDICATIONS: Outpatient Medications Prior to Visit  Medication Sig Dispense Refill   acetaminophen (TYLENOL) 325 MG tablet Take 650 mg by mouth every 6 (six) hours as needed for moderate pain or mild pain.     aspirin EC 81 MG tablet Take 1 tablet (81 mg total) by mouth daily. Swallow whole. 30 tablet 12   dicyclomine (BENTYL) 20 MG tablet Take 20 mg by mouth 3 (three) times daily before meals.     docusate sodium (COLACE) 100 MG capsule Take 1 capsule (100 mg total) by mouth 2 (two) times daily. 10 capsule 0   fluticasone (FLONASE) 50 MCG/ACT nasal spray Place 2 sprays into both nostrils 2 (two) times daily.     gabapentin (NEURONTIN) 300 MG capsule Take 300 mg by mouth 3 (three) times daily.  metFORMIN (GLUCOPHAGE) 500 MG tablet Take 1,000 mg by mouth 2 (two) times daily with a meal.     ondansetron (ZOFRAN ODT) 4 MG disintegrating tablet Take 1 tablet (4 mg total) by mouth every 8 (eight) hours as needed for nausea or vomiting. 6 tablet 0   rosuvastatin (CRESTOR) 20 MG tablet Take 1 tablet (20 mg total) by mouth daily. 30 tablet 0   SUMAtriptan (IMITREX) 100 MG tablet Take 1 tablet by mouth daily as needed for headache or  migraine.     tamsulosin (FLOMAX) 0.4 MG CAPS capsule Take by mouth daily.     pantoprazole (PROTONIX) 40 MG tablet Take 1 tablet (40 mg total) by mouth daily. 30 tablet 0   No facility-administered medications prior to visit.    PAST MEDICAL HISTORY: Past Medical History:  Diagnosis Date   Diabetes mellitus without complication (HCC)    Migraine    Stroke (HCC)     PAST SURGICAL HISTORY: Past Surgical History:  Procedure Laterality Date   BACK SURGERY     GIVENS CAPSULE STUDY N/A 05/05/2015   Procedure: GIVENS CAPSULE STUDY;  Surgeon: Charna Elizabeth, MD;  Location: [facility] ENDOSCOPY;  Service: Endoscopy;  Laterality: N/A;   LOOP RECORDER INSERTION N/A 11/29/2022   Procedure: LOOP RECORDER INSERTION;  Surgeon: Maurice Small, MD;  Location: [facility] INVASIVE CV LAB;  Service: Cardiovascular;  Laterality: N/A;    FAMILY HISTORY: Family History  Problem Relation Age of Onset   Esophageal cancer Mother    Stomach cancer Father    Colon cancer Father    Pancreatic cancer Sister    Arthritis Sister    Heart disease Sister     SOCIAL HISTORY: Social History   Socioeconomic History   Marital status: Married    Spouse name: Not on file   Number of children: Not on file   Years of education: Not on file   Highest education level: Not on file  Occupational History   Not on file  Tobacco Use   Smoking status: Never   Smokeless tobacco: Never  Substance and Sexual Activity   Alcohol use: No   Drug use: No   Sexual activity: Not on file  Other Topics Concern   Not on file  Social History Narrative   Lives at home   Caffeine- 8-16oz   Right handed   Social Determinants of Health   Financial Resource Strain: Not on file  Food Insecurity: No Food Insecurity (11/25/2022)   Hunger Vital Sign    Worried About Running Out of Food in the Last Year: Never true    Ran Out of Food in the Last Year: Never true  Transportation Needs: No Transportation Needs (11/25/2022)   PRAPARE -  Administrator, Civil Service (Medical): No    Lack of Transportation (Non-Medical): No  Physical Activity: Not on file  Stress: Not on file  Social Connections: Not on file  Intimate Partner Violence: Not At Risk (11/25/2022)   Humiliation, Afraid, Rape, and Kick questionnaire    Fear of Current or Ex-Partner: No    Emotionally Abused: No    Physically Abused: No    Sexually Abused: No    PHYSICAL EXAM  GENERAL EXAM/CONSTITUTIONAL: Vitals:  Vitals:   01/05/23 1040  BP: 130/77  Pulse: 69  Weight: 163 lb (73.9 kg)  Height: 5\' 6"  (1.676 m)   Body mass index is 26.31 kg/m. Wt Readings from Last 3 Encounters:  01/05/23 163 lb (73.9 kg)  11/28/22 156 lb 15.5 oz (71.2 kg)  11/20/16 160 lb (72.6 kg)   Patient is in no distress; well developed, nourished and groomed; neck is supple  MUSCULOSKELETAL: Gait, strength, tone, movements noted in Neurologic exam below  NEUROLOGIC: MENTAL STATUS:      No data to display         awake, alert, oriented to person, place and time recent and remote memory intact normal attention and concentration language fluent, comprehension intact, naming intact fund of knowledge appropriate  CRANIAL NERVE:  2nd, 3rd, 4th, 6th - Visual fields full to confrontation, extraocular muscles intact, no nystagmus 5th - facial sensation symmetric 7th - facial strength symmetric 8th - hearing intact 9th - palate elevates symmetrically, uvula midline 11th - shoulder shrug symmetric 12th - tongue protrusion midline  MOTOR:  normal bulk and tone, full strength in the BUE, BLE  SENSORY:  normal and symmetric to light touch  COORDINATION:  finger-nose-finger, fine finger movements normal  REFLEXES:  deep tendon reflexes present and symmetric  GAIT/STATION:  normal   DIAGNOSTIC DATA (LABS, IMAGING, TESTING) - I reviewed patient records, labs, notes, testing and imaging myself where available.  Lab Results  Component Value Date    WBC 5.0 11/29/2022   HGB 12.4 (L) 11/29/2022   HCT 36.8 (L) 11/29/2022   MCV 90.9 11/29/2022   PLT 271 11/29/2022      Component Value Date/Time   NA 137 11/29/2022 0734   K 3.9 11/29/2022 0734   CL 107 11/29/2022 0734   CO2 21 (L) 11/29/2022 0734   GLUCOSE 114 (H) 11/29/2022 0734   BUN 14 11/29/2022 0734   CREATININE 1.81 (H) 11/29/2022 0734   CALCIUM 9.4 11/29/2022 0734   PROT 6.9 11/25/2022 1130   ALBUMIN 3.8 11/25/2022 1130   AST 21 11/25/2022 1130   ALT 14 11/25/2022 1130   ALKPHOS 52 11/25/2022 1130   BILITOT 1.2 11/25/2022 1130   GFRNONAA 39 (L) 11/29/2022 0734   GFRAA >60 11/20/2016 2235   Lab Results  Component Value Date   CHOL 95 11/26/2022   HDL 34 (L) 11/26/2022   LDLCALC 35 11/26/2022   TRIG 132 11/26/2022   CHOLHDL 2.8 11/26/2022   Lab Results  Component Value Date   HGBA1C 6.7 (H) 11/26/2022   Lab Results  Component Value Date   VITAMINB12 251 11/12/2008   Lab Results  Component Value Date   TSH 0.628 Test methodology is 3rd generation TSH 11/12/2008    MRI Brain 01/06/2023 1. 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 2. Underlying mild age-related cerebral atrophy with moderate chronic microvascular ischemic disease  MRA Head 11/25/2022 1. Negative intracranial MRA for large vessel occlusion or other emergent finding. No hemodynamically significant or correctable stenosis. 2. Dolichoectatic appearance of the intracranial arterial circulation, suggesting chronic underlying hypertension.   Echocardiogram 11/26/2022 1. Limited study to 35 images - exam stopped due to patient non-compliance  2. Left ventricular ejection fraction, by estimation, is 60 to 65%. The left ventricle has normal function. The left ventricle has no regional wall motion abnormalities. Left ventricular diastolic parameters are consistent with Grade I diastolic dysfunction (impaired relaxation).  3. The aortic valve is tricuspid. Aortic valve  regurgitation is not visualized.  4. The mitral valve is grossly normal. No evidence of mitral valve regurgitation.  5. Right ventricular systolic function was not well visualized. The right ventricular size is not well visualized.     ASSESSMENT AND PLAN  72 y.o. year  old male with vascular risk factors including diabetes, hyperlipidemia, who is presenting after being discharged from the hospital sp right MCA strokes. Angiogram with no large vessel disease, EF 60 65%, stroke etiology likely embolic based on the location of the strokes, cortical. He has completed DAPT and now plan is for patient to continue with aspirin thereafter. He also has a loop recorder, patient will continue to follow up with cardiology    1. Cerebrovascular accident (CVA) due to embolism of right middle cerebral artery (HCC)   2. Chronic migraine w/o aura w/o status migrainosus, not intractable     Patient Instructions  Continue with Aspirin  Continue with loop recorder  Continue with your other medications  Continue with outpatient rehab  Trial of Amitriptyline for migraine prevention  Follow up in 3 to 4 months for follow up, mainly for the headaches   No orders of the defined types were placed in this encounter.   Meds ordered this encounter  Medications   amitriptyline (ELAVIL) 10 MG tablet    Sig: Take 1 tablet (10 mg total) by mouth at bedtime for 90 doses.    Dispense:  90 tablet    Refill:  0    Return in about 3 months (around 04/07/2023).    Windell Norfolk, MD 01/07/2023, 10:24 AM  [facility] [address], [address] [place], [place] [address] [phone]

## 2023-01-05 NOTE — Therapy (Signed)
OUTPATIENT OCCUPATIONAL THERAPY NEURO  Treatment Note  [name] MRN: [id number] DOB:1951-03-05, 72 y.o., male Today's Date: 01/05/2023  PCP: Antony Haste REFERRING PROVIDER: Lynden Oxford   END OF SESSION:  OT End of Session - 01/05/23 1327     Visit Number 7    Number of Visits 17    Date for OT Re-Evaluation 02/07/23    Authorization Type [employer]    Progress Note Due on Visit 10    OT Start Time 1322    OT Stop Time 1402    OT Time Calculation (min) 40 min    Behavior During Therapy WFL for tasks assessed/performed                   Past Medical History:  Diagnosis Date   Diabetes mellitus without complication (HCC)    Migraine    Stroke [facility])    Past Surgical History:  Procedure Laterality Date   BACK SURGERY     GIVENS CAPSULE STUDY N/A 05/05/2015   Procedure: GIVENS CAPSULE STUDY;  Surgeon: Charna Elizabeth, MD;  Location: [facility] ENDOSCOPY;  Service: Endoscopy;  Laterality: N/A;   LOOP RECORDER INSERTION N/A 11/29/2022   Procedure: LOOP RECORDER INSERTION;  Surgeon: Maurice Small, MD;  Location: [facility] INVASIVE CV LAB;  Service: Cardiovascular;  Laterality: N/A;   Patient Active Problem List   Diagnosis Date Noted   Essential hypertension 11/30/2022   Migraine 11/30/2022   Chronic kidney disease, stage 3a (HCC) 11/30/2022   Controlled type 2 diabetes mellitus without complication, without long-term current use of insulin (HCC) 11/30/2022   HLD (hyperlipidemia) 11/30/2022   Acute CVA (cerebrovascular accident) (HCC) 11/25/2022    ONSET DATE: 11/25/22  REFERRING DIAG: R MCA CVA  THERAPY DIAG:  Other lack of coordination  Visuospatial deficit  Unsteadiness on feet  Muscle weakness (generalized)  Dizziness and giddiness  Rationale for Evaluation and Treatment: Rehabilitation  SUBJECTIVE:   SUBJECTIVE STATEMENT: Pt's spouse reports that pt is more wobbly today.  Patient reports that he saw the MD at [facility].  Reports he gave  him a new medication to help with sleep, but it may have a side effect of dizziness, therefore recommended that he take it at night.   Pt accompanied by: significant other  PERTINENT HISTORY: DM II, Neuropathy, CKD, Migraine  PRECAUTIONS: Fall  WEIGHT BEARING RESTRICTIONS: No  PAIN:  Are you having pain? Yes: NPRS scale: 4/10 Pain location: dull headache Aggravating factors: fatigue Relieving factors: tylenol  FALLS: Has patient fallen in last 6 months? Yes. Number of falls 1  LIVING ENVIRONMENT: Lives with: lives with their spouse Lives in: House/apartment Has following equipment at home:  Has tons of adaptive equipment from prior family members, uses shower seat, walker, has a cane, handicapped height toilet  PLOF: Independent with basic ADLs  PATIENT GOALS: feel like myself again  OBJECTIVE:   HAND DOMINANCE: Right  ADLs: Overall ADLs: supervision Transfers/ambulation related to ADLs: walks with supervision to min assist with RW Eating: Independent Grooming: supervision UB Dressing: independent LB Dressing: supervision Toileting: supervision Bathing: supervision Tub Shower transfers: close supervision Equipment: Shower seat with back and handicapped height toilet  IADLs: Shopping: NT Light housekeeping: Not participating at this time Meal Prep: Not participating at this time Community mobility: Not driving Medication management: wife Banker: wife manages Handwriting:  NT  MOBILITY STATUS: difficulty carrying objections with ambulation  POSTURE COMMENTS:  No Significant postural limitations   ACTIVITY TOLERANCE: Activity tolerance:  Patient has difficulty with fatigue - reports not sleeping well at night, feels tired during the day  FUNCTIONAL OUTCOME MEASURES: FOTO: 63.75  UPPER EXTREMITY ROM:    Active ROM Right eval Left eval  Shoulder flexion [facility]  Shoulder abduction THRUOUT THRUOUT  Shoulder adduction    Shoulder  extension    Shoulder internal rotation    Shoulder external rotation    Elbow flexion    Elbow extension    Wrist flexion    Wrist extension    Wrist ulnar deviation    Wrist radial deviation    Wrist pronation    Wrist supination    (Blank rows = not tested)  UPPER EXTREMITY MMT:     MMT Right eval Left eval  Shoulder flexion 4/5 4+/5  Shoulder abduction 4+/5 THRUOUT  Shoulder adduction THRUOUT   Shoulder extension    Shoulder internal rotation    Shoulder external rotation    Middle trapezius    Lower trapezius    Elbow flexion    Elbow extension    Wrist flexion    Wrist extension    Wrist ulnar deviation    Wrist radial deviation    Wrist pronation    Wrist supination    (Blank rows = not tested)  HAND FUNCTION: Grip strength: Right: 80 lbs; Left: 76 lbs and Lateral pinch: Right: 20 lbs, Left: 20 lbs  COORDINATION: Finger Nose Finger test: undershooting consistently with LUE 9 Hole Peg test: Right: 29.68 sec; Left: 36.25 sec  SENSATION: Light touch: Impaired  Stereognosis: Not tested Proprioception: WFL  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 Wife reports he was initially very confused - even hallucinating.  Improving but she reports he often is off topic in conversation  VISION: Subjective report: reports change in peripheral vision Baseline vision: Wears glasses for reading only Visual history: cataracts, retinal issues in left eye  VISION ASSESSMENT: Impaired Eye alignment: Impaired: mild Ocular ROM: impaired Tracking/Visual pursuits: Decreased smoothness with horizontal tracking and Decreased smoothness with vertical tracking Saccades: undershoots and decreased speed of saccadic movements Convergence: Impaired:   Visual Fields: Left  visual field deficits Diplopia assessment: present in Left gaze needs further testing - seems more apparent vertically.  Need to differentiate between gaze stabilization and diploia  Patient has difficulty with following activities due to following visual impairments: reading, navigating environment  PERCEPTION: Impaired: Spatial orientation: posterior bias  PRAXIS: WFL     TODAY'S TREATMENT:                                                              01/05/23 Card sorting: engaged in sorting cards by suit with L hand with focus on visual scanning/perception and coordination.  Pt requiring increased time/effort due to persistent double vision.  Pt able to match cards by suit with use of jumbo size cards, however reporting double vision with shapes on cards.   Resistance Clothespins: 2,4,6# with LUE for mid functional reaching and sustained pinch. OT placing clothespins to L and vertical dowel at midline to facilitate visual scanning both horizontally and vertically.  Pt  reports difficulty due to double vision when attempting to place clothespins on vertical dowel and then transitioning to horizontal dowel.  Pt reporting increased difficulty as height of clothespins increased due to increase of double vision with vertical scanning.     Today's Vitals   01/05/23 1326 01/05/23 1330  BP: (!) 148/79 136/80  Pulse: (!) 115 (!) 115   There is no height or weight on file to calculate BMI.     01/03/23 NMR: engaged in placing progressively smaller pegs into pegboard with LUE with focus on visual attention and focus on making eyes focus on object to make single vision.  Pt reports double vision at midline and at all ranges on arc pegboard.  Progressed coordination aspect to picking up pegs one at a time to place into palm of hand and translate back to fingers tips to place in pegboard.   Vision: engaged in reaching to place peg in cone at midline with focus on making cone remain in single vision.   Pt with difficulty with changing visual attention between items at table top and back to cone at midline (around arms length). OT challenged pt to look down at pegs and then back up to cone to challenge ability to change focus between two items.  Pt with difficulty with alternating visual attention and returning items to single vision. Attempted to move target from midline, pt with increased double vision L > R of midline. Vision: Provided with neuro-ophthalmologist contacts for persistent visual impairments.  Encouraged pt to reach out for further assessment and treatment.    12/24/22 Coordination: engaged in picking up large jacks and placing in matching colored cones with LUE.  Pt demonstrating good motor control when picking up and with reaching to place in cones across midline and outside BOS. Pt demonstrating good success when placing in cone from a visual standpoint.   Vision: engaged in reaching to place jack in cone at midline with focus on making cone remain in single vision.  Pt with difficulty with changing visual attention between items at table top and back to cone at midline (around arms length) therefore pt maintaining visual attention on cone and relying on sensation when reaching for jacks.  OT challenged pt to look down at jacks and then back up to cone to challenge ability to change focus between two items.  Pt with difficulty with alternating visual attention and returning items to single vision.     12/22/22 Visual scanning: engaged in Spot It activity with focus on scanning vertically and horizontally to identify matching pictures on cards with multiple pictures.  Pt reports increased difficulty with scanning horizontally.  Increased challenge to field of 9 with instruction to locate 3 matching pictures thus challenging visual scanning in more spontaneous pattern. Bell cancellation test: pt completed in 4:30, demonstrating increased distraction as task continued - pt quite verbose.   Pt omiting 4 bells at midline and in R visual field.        PATIENT EDUCATION: Education details: ongoing condition specific education Person educated: Patient Education method: Explanation Education comprehension: needs further education  HOME EXERCISE PROGRAM: Coordination handout  Access Code: [email or web address] URL: [email or web address] Date: 12/15/2022 Prepared by: [facility] - Outpatient  Rehab - [facility]  Exercises - Seated Horizontal Smooth Pursuit  - 2 x daily - 7 x weekly - 1 sets - 5 reps   GOALS: Goals reviewed with patient? No  SHORT TERM GOALS: Target date: 01/07/23  Patient will complete home exercise program  designed to improve coordination in left UE Goal status: IN PROGRESS  2.  Patient will complete an HEP designed to improve visual motor skills  Goal status: IN PROGRESS  3.  Patient will demonstrate improved awareness of visual management strategies for diplopia, and gaze stabilization.  Goal status: IN PROGRESS  4.  Patient will complete a shower with distant supervision  Goal status: IN PROGRESS  5.  Patient will reach to floor to obtain lightweight object to put on overhead shelf without loss of balance.    Goal status: IN PROGRESS   LONG TERM GOALS: Target date: 02/07/23  Patient will return to simple cooking - hot meal for he and his wife with intermittent min assistance and supervision  Goal status: IN PROGRESS  2.  Patient will read news article in increased font (14+) x 5 min with 100% comprehension   Goal status: IN PROGRESS  3.  Patient will demonstrate awareness of return to driving recommendations  Goal status: IN PROGRESS  4.  Patient will report effective simple hand tool use for simple household repairs  Goal status: IN PROGRESS  5.  Patient will demonstrate at least 3 second improvement in 9 hole peg test LUE  Goal status: IN PROGRESS  6.  Patient will safely return to simple outside maintenance -  gardening, mowing with supervision  Goal status: IN PROGRESS  ASSESSMENT:  CLINICAL IMPRESSION: Pt required increased time to come up to standing and regain bearings before ambulating from waiting room to treatment gym.  OT providing close CGA for ambulation with SPC.  Pt continues to report double vision with table top tasks and attempts at tossing ball with granddaughter over the weekend.  Pt with difficulty obtaining single vision during table top tasks and with inability to obtain single vision in L visual field at any range.  OT provided pt with contact information for neuro ophthalmologist and encouraged pt to have spouse call to get additional support for persistent visual impairments.  PERFORMANCE DEFICITS: in functional skills including ADLs, IADLs, coordination, sensation, strength, pain, Fine motor control, Gross motor control, mobility, balance, endurance, decreased knowledge of use of DME, vision, and UE functional use, cognitive skills including attention, energy/drive, memory, problem solving, safety awareness, and sequencing, and psychosocial skills including habits and routines and behaviors.   IMPAIRMENTS: are limiting patient from ADLs, IADLs, and leisure.   CO-MORBIDITIES: may have co-morbidities  that affects occupational performance. Patient will benefit from skilled OT to address above impairments and improve overall function.  MODIFICATION OR ASSISTANCE TO COMPLETE EVALUATION: Min-Moderate modification of tasks or assist with assess necessary to complete an evaluation.  OT OCCUPATIONAL PROFILE AND HISTORY: Detailed assessment: Review of records and additional review of physical, cognitive, psychosocial history related to current functional performance.  CLINICAL DECISION MAKING: Moderate - several treatment options, min-mod task modification necessary  REHAB POTENTIAL: Good  EVALUATION COMPLEXITY: Moderate    PLAN:  OT FREQUENCY: 2x/week  OT DURATION: 8  weeks  PLANNED INTERVENTIONS: self care/ADL training, therapeutic exercise, therapeutic activity, neuromuscular re-education, balance training, functional mobility training, patient/family education, cognitive remediation/compensation, visual/perceptual remediation/compensation, and DME and/or AE instructions  RECOMMENDED OTHER SERVICES: Possibly Neuro-optometry  CONSULTED AND AGREED WITH PLAN OF CARE: Patient and family member/caregiver  PLAN FOR NEXT SESSION: Need further visual motor assessment.  He reports at end of eval that he has significant dizziness to the point of vomiting.  Has had one fall in reaching to obtain item from floor.  Needs stand balance training -  dynamic.  Needs env't scanning, and gaze stabilization/ eye teaming activity.  Provide with neuro-ophthalmologist contacts for persistent visual impairments.   Rosalio Loud, OT 01/05/2023, 1:27 PM

## 2023-01-05 NOTE — Therapy (Signed)
OUTPATIENT PHYSICAL THERAPY NEURO TREATMENT   [name] MRN: [id number] DOB:23-Aug-1951, 72 y.o., male Today's Date: 01/05/2023   PCP: [name], MD REFERRING PROVIDER: [name], MD >to send to PCP, [name]  END OF SESSION:  PT End of Session - 01/05/23 1453     Visit Number 7    Number of Visits 17    Date for PT Re-Evaluation 02/04/23    Authorization Type [employer]    Progress Note Due on Visit 10    PT Start Time 1445    PT Stop Time 1530    PT Time Calculation (min) 45 min    Equipment Utilized During Treatment Gait belt    Activity Tolerance Patient tolerated treatment well    Behavior During Therapy WFL for tasks assessed/performed               Past Medical History:  Diagnosis Date   Diabetes mellitus without complication (HCC)    Migraine    Stroke [facility])    Past Surgical History:  Procedure Laterality Date   BACK SURGERY     GIVENS CAPSULE STUDY N/A 05/05/2015   Procedure: GIVENS CAPSULE STUDY;  Surgeon: Charna Elizabeth, MD;  Location: [facility] ENDOSCOPY;  Service: Endoscopy;  Laterality: N/A;   LOOP RECORDER INSERTION N/A 11/29/2022   Procedure: LOOP RECORDER INSERTION;  Surgeon: Maurice Small, MD;  Location: [facility] INVASIVE CV LAB;  Service: Cardiovascular;  Laterality: N/A;   Patient Active Problem List   Diagnosis Date Noted   Essential hypertension 11/30/2022   Migraine 11/30/2022   Chronic kidney disease, stage 3a (HCC) 11/30/2022   Controlled type 2 diabetes mellitus without complication, without long-term current use of insulin (HCC) 11/30/2022   HLD (hyperlipidemia) 11/30/2022   Acute CVA (cerebrovascular accident) (HCC) 11/25/2022    ONSET DATE: 11/25/2022  REFERRING DIAG: I63.9 (ICD-10-CM) - Cerebrovascular accident (CVA), unspecified mechanism   THERAPY DIAG:  Unsteadiness on feet  Muscle weakness (generalized)  Dizziness and giddiness  Other abnormalities of gait and mobility  Rationale for Evaluation  and Treatment: Rehabilitation  SUBJECTIVE:                                                                                                                                                                                             SUBJECTIVE STATEMENT: Nothing new to report   Pt accompanied by: self  PERTINENT HISTORY: a-fib, DM, hx of migraines, hx of back surgery, multiple knee surgeries (per pt report)  PAIN:  Are you having pain? Yes: NPRS scale: 5/10 Pain location: headache Pain description: headache Aggravating factors: sunlight, noise Relieving  factors: darkness, quietness  PRECAUTIONS: Fall and Other: loop recorder; requires supervision at all times  WEIGHT BEARING RESTRICTIONS: No  FALLS: Has patient fallen in last 6 months? Yes. Number of falls 1 with his initial CVA  LIVING ENVIRONMENT: Lives with: lives with their spouse Lives in: House/apartment Stairs: Yes: External: 3 steps; on right going up.  Home has basement, but he doesn't go down into basement. Has following equipment at home: Single point cane, Walker - 2 wheeled, and bed side commode  PLOF: Independent.  [place], mowing.  Enjoys seeing grandson play baseball.  PATIENT GOALS: To get back to normal  OBJECTIVE:   TODAY'S TREATMENT: 01/05/23 Activity Comments  Transfer training   Pre-gait -training in LLE advancement w/ 2.5 lbs on dorsum of left foot to promote dorsiflexion and knee extension -single limb support drill for LLE stance stability -step length drill for large amplitude RLE advancement--uncomfortable on left knee      Balance training -tandem 2x30 sec -feet together x 30 sec -feet together 4x15 sec -sidestepping x 2 min            TODAY'S TREATMENT: 01/03/2023 Activity Comments  Sit<>stand x 10 reps from mat Hands on knees/mat, slowed and guarded  5x sit<>stand:  35.90 sec without UE support  Good form overall, decreased eccentric control last 2 reps   Berg 34/56 Improved from 22/56  Gait training with cane, 85 ft x 3 reps Initial cues for gait sequencing  Vitals at end of session:  136/85   At counter:  Romberg stance, tandem stance, SLS UE support         PATIENT EDUCATION: Education details: progress towards goals-improved balance, still at fall risk; use cane for short distances, RW still for long distances. Person educated: Patient Education method: Explanation and Demonstration Education comprehension: verbalized understanding and returned demonstration    HOME EXERCISE PROGRAM Last updated: 12/17/22 Access Code: [email or web address] URL: [email or web address] Date: 12/17/2022 Prepared by: [facility] - Outpatient  Rehab - [facility]  Exercises - Seated March  - 1 x daily - 5 x weekly - 3 sets - 10 reps - Seated Long Arc Quad  - 1 x daily - 5 x weekly - 3 sets - 10 reps - Pencil Pushups  - 2 x daily - 7 x weekly - 1 sets - 5 reps - Sit to Stand with Counter Support  - 1 x daily - 5 x weekly - 2 sets - 10 reps - Seated Left Head Turns Vestibular Habituation  - 1 x daily - 5 x weekly - 2-3 sets - 30 sec hold - Seated Head Nod  - 1 x daily - 5 x weekly - 2-3 sets - 30 sec hold      Below measures were taken at time of initial evaluation unless otherwise specified:    VESTIBULAR ASSESSMENT   GENERAL OBSERVATION: No acute distress    SYMPTOM BEHAVIOR:   Subjective history: Recent hx of CVA and since then he feels like he is being pulled backward or to the side.  Wife (at eval) notes veering with gait.   Non-Vestibular symptoms: diplopia, migraine symptoms, and CVA 11/25/22   Type of dizziness: Imbalance (Disequilibrium) and Unsteady with head/body turns.  Feels like he is going backwards   Frequency: throughout the day   Duration: with movement   Aggravating factors: Induced by position change: sit to stand and Induced by motion: occur when walking   Relieving  factors: slow movements   Progression of  symptoms: unchanged   OCULOMOTOR EXAM:   Ocular Alignment: abnormal and L eye slightly lower   Ocular ROM: No Limitations and double vision with upper motion   Spontaneous Nystagmus: absent   Gaze-Induced Nystagmus: absent   Smooth Pursuits: intact   Saccades: slow, diplopia in upward vertical direction   Convergence/Divergence: 30 inches, then 19 inches (notes blurry vision farther away)    VESTIBULAR - OCULAR REFLEX:    Slow VOR: Comment: double vision horizontal and vertical, rates dizziness as 4/10   VOR Cancellation: Comment: reports dizziness 8/10   Head-Impulse Test: NT          -------------------------------------------------------------- Objective measures below taken at the initial evaluation:  DIAGNOSTIC FINDINGS: 1. 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 2. Underlying mild age-related cerebral atrophy with moderate chronic microvascular ischemic disease.  COGNITION: Overall cognitive status: Impaired   SENSATION: Light touch: WFL and reports sometimes having tingling on left foot  COORDINATION: Decreased LLE  MUSCLE TONE: WFL BLEs  VITALS:  137/85, HR 71  POSTURE: rounded shoulders and posterior pelvic tilt  LOWER EXTREMITY ROM:   in sitting, Active ROM WFL BLEs   LOWER EXTREMITY MMT:    MMT Right Eval Left Eval  Hip flexion 4+ 4  Hip extension    Hip abduction    Hip adduction    Hip internal rotation    Hip external rotation    Knee flexion 4 3+  Knee extension 4 3+  Ankle dorsiflexion 4 3+  Ankle plantarflexion    Ankle inversion    Ankle eversion    (Blank rows = not tested)   TRANSFERS: Assistive device utilized: None  Sit to stand: CGA Stand to sit: CGA Slowed, ataxic movements with transfers GAIT: Gait pattern: step through pattern, shuffling, ataxic, and wide BOS; veers to L Distance walked: 60 ft Assistive device utilized: Environmental consultant - 2 wheeled Level of assistance: CGA Comments: Tends to  vent to L  FUNCTIONAL TESTS:  5 times sit to stand: 25.34 sec without UE support 10 meter walk test: NT Berg Balance Scale: 22/56     PATIENT SURVEYS:  FOTO Intake score 50; predicted 69  TODAY'S TREATMENT:                                                                                                                              DATE: 12/09/2022    PATIENT EDUCATION: Education details: Eval results, POC, recommendation to continue to use walker due to high fall risk Person educated: Patient and Spouse Education method: Explanation Education comprehension: verbalized understanding  HOME EXERCISE PROGRAM: Not yet initiated  GOALS: Goals reviewed with patient? Yes  SHORT TERM GOALS: Target date: 01/07/2023  Pt will be supervision with HEP for improved balance, strength, gait. Baseline: Goal status: IN PROGRESS  2.  Pt will improve 5x sit<>stand to less than or equal to 20 sec to demonstrate  improved functional strength and transfer efficiency.  Baseline: 25.34 sec Goal status: IN PROGRESS  3.  Pt will improve Berg score to at least 30/56 to decrease fall risk. Baseline: 22/56>34/56 Goal status: MET  4.  Further vestibular testing to be completed and goal set as appropriate. Baseline:  Goal status: IN PROGRESS   LONG TERM GOALS: Target date: 02/04/2023  Pt will be supervision with HEP for improved strength, balance, gait. Baseline:  Goal status: IN PROGRESS  2.  Pt will improve 5x sit<>stand to less than or equal to 15 sec to demonstrate improved functional strength and transfer efficiency. Baseline: 25.34 sec Goal status: IN PROGRESS  3.  Pt will improve Berg score to at least 40/56 to decrease fall risk. Baseline: 22/56 Goal status: IN PROGRESS  4.  FOTO score to improve to 69 to demo improved overall functional mobility. Baseline: 50 Goal status: IN PROGRESS  5.  Further vestibular testing to be completed/goal written as appropriate. Baseline:  Goal  status: IN PROGRESS  6.  Pt will ambulate at least 1000 ft indoor and outdoor surfaces, with appropriate assistive device, mod I, for outdoor and community gait. Baseline:  Goal status: IN PROGRESS  ASSESSMENT:  CLINICAL IMPRESSION: Continues to experience visual distortion and feeling of dizziness/world spinning sensation with sit to stand which requires visual fixation and a few moments to accommodate.  Continued with focus on improving single limb stability to improve stride length in gait.  Difficulty with maintaining equilibrium with eyes closed conditions and espeically with compliant surfaces.   Continued sessions to advance LE strength and progress to more challenging dynamic balance and facilitate righting reactions.   OBJECTIVE IMPAIRMENTS: Abnormal gait, decreased balance, decreased knowledge of use of DME, decreased mobility, difficulty walking, decreased ROM, decreased strength, decreased safety awareness, dizziness, and postural dysfunction.   ACTIVITY LIMITATIONS: carrying, lifting, bending, standing, squatting, transfers, bathing, toileting, dressing, and locomotion level  PARTICIPATION LIMITATIONS: meal prep, cleaning, laundry, community activity, and yard work  PERSONAL FACTORS: 3+ comorbidities: see PMH; also potential visual/ vestibular component to pt's balance that needs to be further assessed  are also affecting patient's functional outcome.   REHAB POTENTIAL: Good  CLINICAL DECISION MAKING: Evolving/moderate complexity  EVALUATION COMPLEXITY: Moderate  PLAN:  PT FREQUENCY: 2x/week  PT DURATION: 8 weeks plus eval  PLANNED INTERVENTIONS: Therapeutic exercises, Therapeutic activity, Neuromuscular re-education, Balance training, Gait training, Patient/Family education, Self Care, Vestibular training, Canalith repositioning, and DME instructions  PLAN FOR NEXT SESSION: assess orthostatics? Add to HEP:  tandem stance, SLS, Romberg.  Progress balance and strength,  gait with cane; continue working on convergence, VOR, habituation in functional activities   4:24 PM, 01/05/23 M. Shary Decamp, PT, DPT Physical Therapist- [place] Office Number: [phone]

## 2023-01-06 NOTE — Patient Instructions (Signed)
Continue with Aspirin  Continue with loop recorder  Continue with your other medications  Continue with outpatient rehab  Trial of Amitriptyline for migraine prevention  Follow up in 3 to 4 months for follow up, mainly for the headaches

## 2023-01-10 ENCOUNTER — Ambulatory Visit: Payer: Medicare Other | Admitting: Occupational Therapy

## 2023-01-10 ENCOUNTER — Ambulatory Visit: Payer: Medicare Other

## 2023-01-10 DIAGNOSIS — R41841 Cognitive communication deficit: Secondary | ICD-10-CM

## 2023-01-10 DIAGNOSIS — R42 Dizziness and giddiness: Secondary | ICD-10-CM

## 2023-01-10 DIAGNOSIS — R278 Other lack of coordination: Secondary | ICD-10-CM

## 2023-01-10 DIAGNOSIS — R2681 Unsteadiness on feet: Secondary | ICD-10-CM

## 2023-01-10 DIAGNOSIS — R471 Dysarthria and anarthria: Secondary | ICD-10-CM

## 2023-01-10 DIAGNOSIS — M6281 Muscle weakness (generalized): Secondary | ICD-10-CM

## 2023-01-10 DIAGNOSIS — R41842 Visuospatial deficit: Secondary | ICD-10-CM

## 2023-01-10 NOTE — Patient Instructions (Signed)
   [name] thinks that his clarity/enunciation of speech is pretty good, it's more the LOUDNESS of his talking that is still deficient. (His speech is definitely more clear than it was in the first couple sessions I had with him for speech therapy.)  I wanted to get your and the family's thoughts on whether the loudness is the main part of his talking that is different at this time. Thanks.

## 2023-01-10 NOTE — Therapy (Signed)
OUTPATIENT SPEECH LANGUAGE PATHOLOGY TREATMENT   [name] MRN: [id number] DOB:[birth year], 72 y.o., male Today's Date: 01/10/2023  PCP: Antony Haste, MD REFERRING PROVIDER: [name], MD  Antony Haste MD - doc)  END OF SESSION:     Past Medical History:  Diagnosis Date   Diabetes mellitus without complication (HCC)    Migraine    Stroke [facility])    Past Surgical History:  Procedure Laterality Date   BACK SURGERY     GIVENS CAPSULE STUDY N/A 05/05/2015   Procedure: GIVENS CAPSULE STUDY;  Surgeon: Charna Elizabeth, MD;  Location: [facility] ENDOSCOPY;  Service: Endoscopy;  Laterality: N/A;   LOOP RECORDER INSERTION N/A 11/29/2022   Procedure: LOOP RECORDER INSERTION;  Surgeon: Maurice Small, MD;  Location: [facility] INVASIVE CV LAB;  Service: Cardiovascular;  Laterality: N/A;   Patient Active Problem List   Diagnosis Date Noted   Essential hypertension 11/30/2022   Migraine 11/30/2022   Chronic kidney disease, stage 3a (HCC) 11/30/2022   Controlled type 2 diabetes mellitus without complication, without long-term current use of insulin (HCC) 11/30/2022   HLD (hyperlipidemia) 11/30/2022   Acute CVA (cerebrovascular accident) (HCC) 11/25/2022    ONSET DATE: 11/25/22   REFERRING DIAG: CVA  THERAPY DIAG:  Cognitive communication deficit  Dysarthria and anarthria  Rationale for Evaluation and Treatment: Rehabilitation  SUBJECTIVE:   SUBJECTIVE STATEMENT: No additional coughing episodes since last reported. Had vitamin D supplement yesterday and GI tract was affected all yesterday.  Pt accompanied by: self  PERTINENT HISTORY: presented 11/25/22 with AMS, blurred vision, headache, and L-sided numbness and weakness. 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 PMH: DM, migraine  PAIN:  Are you having pain? Yes: NPRS scale: 6/10 Pain location: head Pain description: headache Aggravating factors: activity Relieving  factors: rest  PATIENT GOALS: Improve communication skills  OBJECTIVE:   DIAGNOSTIC FINDINGS:  BSE 11/26/22 Clinical Impression   Pt reports no swallowing difficulties PTA. He has L side neglect that affects his ability to attend to POs presented on that side and appropriately raise them to his mouth. Oral motor exam revealed L side asymmetry and mild difficulty coodinating some movements. Pt exhibited difficulty forming a labial seal given thin liquids via straw and purees via spoon as well as oral holding during trials of solid textures. He had no observed s/s of aspiration throughout PO trials. Due to pt's awareness deficits and overall mentation, recommend Dys 2 (finely chopped) and thin liquids with full supervision. SLP will f/u as able to address potential for trials of upgraded textures as mentation improves.    COGNITION: Overall cognitive status: Impaired Areas of impairment:  Attention: Impaired: Sustained, Selective, Alternating, Divided Memory: Impaired: Short term Executive function: Impaired: Slow processing Functional deficits: Pt states he is talking and then loses train of thought often. He is no longer managing his medication (see "S" statement) nor his appointments. "If I had a calendar (in the kitchen) it would help." Formal cognitive assessment may be warranted in the next few sessions.     STANDARDIZED ASSESSMENTS: CLQT: Attention: WNL, Memory: Mild, Executive Function: WNL, Language: WNL, Visuospatial Skills: WNL, and Clock Drawing: Mild Pt scored under the "cut score" for three subtests: clock drawing, mazes, and design generation. These subtests do rely somewhat on vision -which, at this time pt complains of "double-vision". Anecdotally, pt has some sx of focused attention deficits such as overly verbose at times and losing track of his point, or the  topic, and exhibiting rambling speech. SLP to cont to monitor.    PATIENT REPORTED OUTCOME MEASURES  (PROM): Cognitive Function: 71/140 for higher scores indicating better cognition/better QOL relative to cognition. Score of "1" or "occurs very often" with the following areas: Indicating making simple mistakes easily, difficulty thinking of specific words, reading something several times to understand it, alternating attention, remembering someone's name, slower reaction time in conversation, more difficulty forming thoughts, slow thinking, working hard to pay attention, and trouble concentrating. With Communication Effectiveness Survey: 16/32 with higher scores indicating better QOL/ better communication effectiveness (only "1" being having a conversation with someone across a room) .  TODAY'S TREATMENT:                                                                                                                                         DATE:  01/10/23: Pt volume was sub 70 dB for entire session with verbalization. Pt returned his PROMs today and scored above. He recalled he needed to turn in his PROMs and homework with extra time - presented to SLP 15 minutes into session: "I  had to have [name] read off some of the questions because they were too small. My bifocals are really hard to see with now." 100% success with homework.  Pt tells SLP he feels his speech intelligibility is much better and now the volume of his speech is the primary area making people have to ask him to repeat. Sent note home with pt to ascertain if pt's loudness was more of a deficit than pt's speech intelligibility, which has improved dramatically since initial eval.  Pt stated he hasn't coughed with meals in a while. Today in session he had multiple consecutive sips H2O without any overt s/sx difficulty.  01/05/23: Pt did not complete PROMs , nor homework due to ill yesterday. Very dizzy upon entry to ST today due to ride to therapies today.  SLP measured pt's voice volume - average mid 60s dB. SLP inquired and pt stated wife told  him today "for first time in a long time that I was mumbling." SLP educated pt about energy conservation and management; pt demonstrated understanding.  01/03/23: SLP completed CLQT, with results as above. Pt began session with 5 1/4 minute explanation about why he had experience with a cane, demonstrating possible deficit with focused or internal selective attention.  Pt's speech volume was measured as WNL today in 12 minutes of focused conversation (including initial conversation about pt's cane experiences). PT without anomia today in this session.  12/22/22: Two coughing episodes since last session - one Sunday on tea and one today with water. SLP to continue to track these. Pt without overt s/sx aspiration PNA today. SLP cont'd with pt's dysarthria HEP today and he req'd min-mod A usually for stressing/strong lingual sounds on the HEP, and for procedure. Pt stated he was performing HEP once/day.  CLQT initiated today. By end of session (15 minutes with CLQT) pt's HA increased in pain from 4/10 to 6/10. During the last 15 minutes of session (during CLQT) pt demonstrated word finding difficulty x2; Extra time (<3 seconds) necessary for pt to be successful. Prior to this, pt without difficulty in min-mod complex conversation.   12/17/22: Perform CLQT next session. Pt took medicines this morning and he did them WNL. MD visit yesterday brought new meds and deleted some others and pt to pick those up today.  SLP engaged pt in salient min-mod complex conversation and pt had 4 anomic errors which he compensated for. SLP provided him homework for word finding.  12/15/22: Pt did not bring in cough log - forgot he was supposed to do this. SLP made a rudimentary log for pt to take. SLP discussed his medication administration with him today and see pt instructions for the result of this conversation. SLP educated how pain can interfere with rehab treatment and encouraged pt to take pain reliever if necessary prior  to rehab appointments.  Pt with dysnomia (Daughter instead of daughter in law, and carport instead of garage) which were both corrected by pt.   12/09/22 (eval): SLP went through portion of pt's HEP for oral strengthening. Full program will need to be completed with pt next time.  PATIENT EDUCATION: Education details: Eval results, HEP for lingual weakness and labial weakness Person educated: Patient and Spouse Education method: Explanation, Demonstration, Verbal cues, and Handouts Education comprehension: verbalized understanding, returned demonstration, verbal cues required, and needs further education   GOALS: Goals reviewed with patient? Yes  SHORT TERM GOALS: Target date: 01/16/23  Pt will report improved communication (speech intelligibility) after dinner between 3 sessions Baseline: 01/10/23 Goal status: Ongoing  2.  Pt will bring cough log to two of first 4 sessions; pt will be referred for MBS if clinically warranted Baseline: 12/22/22 Goal status: Partially met  3.  Pt will undergo cognitive assessment in first 3 sessions Baseline:  Goal status: Met  4.  Pt speech volume in 5 minutes simple conversation will improve to WNL in 3 sessions Baseline: 01/03/23, 01/10/23 Goal status: Met   LONG TERM GOALS: Target date: 03/09/23  Pt's PROM measures will increase compared to first measurement during first therapy session Baseline:  Goal status: Ongoing  2.  Pt speech volume in 10 minutes mod complex conversation will improve to WNL in 3 sessions Baseline: 01/10/23 Goal status: Ongoing  3.  Pt will report improved communication (speech intelligibility) after dinner between 3 sessions (after 01/16/23) Baseline:  Goal status: Ongoing    ASSESSMENT:  CLINICAL IMPRESSION: Patient is a 72 y.o. male who is seen in ST for speech intelligibility and cognitive communication abilities following a CVA 11-25-22. MBS does not appear necessary at this time as pt does not recall coughing  episodes, nor had any coughing episodes today with consecutive multiple sips today in session x3. SLP detected anomia x4 during session today, cont to suspect word finding with cognitive/processing basis rather than a language deficit.  OBJECTIVE IMPAIRMENTS: include attention, memory, expressive language, receptive language, and dysarthria. These impairments are limiting patient from managing medications, managing appointments, household responsibilities, ADLs/IADLs, effectively communicating at home and in community, and safety when swallowing. Factors affecting potential to achieve goals and functional outcome are  none noted today . Patient will benefit from skilled SLP services to address above impairments and improve overall function.  REHAB POTENTIAL: Good  PLAN:  SLP FREQUENCY: 2x/week  SLP DURATION: 12 weeks  PLANNED INTERVENTIONS: Environmental controls, Cueing hierachy, Cognitive reorganization, Internal/external aids, Oral motor exercises, Functional tasks, SLP instruction and feedback, Compensatory strategies, Patient/family education, and MBS    Jenette Rayson, CCC-SLP 01/10/2023, 2:28 PM

## 2023-01-10 NOTE — Therapy (Signed)
OUTPATIENT OCCUPATIONAL THERAPY NEURO  Treatment Note  [name] MRN: [id number] DOB:February 24, 1951, 72 y.o., male Today's Date: 01/10/2023  PCP: Antony Haste REFERRING PROVIDER: Lynden Oxford   END OF SESSION:  OT End of Session - 01/10/23 1331     Visit Number 8    Number of Visits 17    Date for OT Re-Evaluation 02/07/23    Authorization Type [employer]    Progress Note Due on Visit 10    OT Start Time 1317    OT Stop Time 1400    OT Time Calculation (min) 43 min    Behavior During Therapy WFL for tasks assessed/performed                    Past Medical History:  Diagnosis Date   Diabetes mellitus without complication (HCC)    Migraine    Stroke [facility])    Past Surgical History:  Procedure Laterality Date   BACK SURGERY     GIVENS CAPSULE STUDY N/A 05/05/2015   Procedure: GIVENS CAPSULE STUDY;  Surgeon: Charna Elizabeth, MD;  Location: [facility] ENDOSCOPY;  Service: Endoscopy;  Laterality: N/A;   LOOP RECORDER INSERTION N/A 11/29/2022   Procedure: LOOP RECORDER INSERTION;  Surgeon: Maurice Small, MD;  Location: [facility] INVASIVE CV LAB;  Service: Cardiovascular;  Laterality: N/A;   Patient Active Problem List   Diagnosis Date Noted   Essential hypertension 11/30/2022   Migraine 11/30/2022   Chronic kidney disease, stage 3a (HCC) 11/30/2022   Controlled type 2 diabetes mellitus without complication, without long-term current use of insulin (HCC) 11/30/2022   HLD (hyperlipidemia) 11/30/2022   Acute CVA (cerebrovascular accident) (HCC) 11/25/2022    ONSET DATE: 11/25/22  REFERRING DIAG: R MCA CVA  THERAPY DIAG:  Unsteadiness on feet  Muscle weakness (generalized)  Other lack of coordination  Visuospatial deficit  Dizziness and giddiness  Rationale for Evaluation and Treatment: Rehabilitation  SUBJECTIVE:   SUBJECTIVE STATEMENT: Pt reports that his day started out pretty decent, but continues to c/o increased double vision after riding  in the car.  Reports going to church Sunday for the first time since his stroke. Pt accompanied by: significant other  PERTINENT HISTORY: DM II, Neuropathy, CKD, Migraine  PRECAUTIONS: Fall  WEIGHT BEARING RESTRICTIONS: No  PAIN:  Are you having pain? No  FALLS: Has patient fallen in last 6 months? Yes. Number of falls 1  LIVING ENVIRONMENT: Lives with: lives with their spouse Lives in: House/apartment Has following equipment at home:  Has tons of adaptive equipment from prior family members, uses shower seat, walker, has a cane, handicapped height toilet  PLOF: Independent with basic ADLs  PATIENT GOALS: feel like myself again  OBJECTIVE:   HAND DOMINANCE: Right  ADLs: Overall ADLs: supervision Transfers/ambulation related to ADLs: walks with supervision to min assist with RW Eating: Independent Grooming: supervision UB Dressing: independent LB Dressing: supervision Toileting: supervision Bathing: supervision Tub Shower transfers: close supervision Equipment: Shower seat with back and handicapped height toilet  IADLs: Shopping: NT Light housekeeping: Not participating at this time Meal Prep: Not participating at this time Community mobility: Not driving Medication management: wife Banker: wife manages Handwriting:  NT  MOBILITY STATUS: difficulty carrying objections with ambulation  POSTURE COMMENTS:  No Significant postural limitations   ACTIVITY TOLERANCE: Activity tolerance: Patient has difficulty with fatigue - reports not sleeping well at night, feels tired during the day  FUNCTIONAL OUTCOME MEASURES: FOTO: 63.75  UPPER EXTREMITY ROM:  Active ROM Right eval Left eval  Shoulder flexion [facility]  Shoulder abduction THRUOUT THRUOUT  Shoulder adduction    Shoulder extension    Shoulder internal rotation    Shoulder external rotation    Elbow flexion    Elbow extension    Wrist flexion    Wrist extension    Wrist ulnar  deviation    Wrist radial deviation    Wrist pronation    Wrist supination    (Blank rows = not tested)  UPPER EXTREMITY MMT:     MMT Right eval Left eval  Shoulder flexion 4/5 4+/5  Shoulder abduction 4+/5 THRUOUT  Shoulder adduction THRUOUT   Shoulder extension    Shoulder internal rotation    Shoulder external rotation    Middle trapezius    Lower trapezius    Elbow flexion    Elbow extension    Wrist flexion    Wrist extension    Wrist ulnar deviation    Wrist radial deviation    Wrist pronation    Wrist supination    (Blank rows = not tested)  HAND FUNCTION: Grip strength: Right: 80 lbs; Left: 76 lbs and Lateral pinch: Right: 20 lbs, Left: 20 lbs  COORDINATION: Finger Nose Finger test: undershooting consistently with LUE 9 Hole Peg test: Right: 29.68 sec; Left: 36.25 sec  SENSATION: Light touch: Impaired  Stereognosis: Not tested Proprioception: WFL  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 Wife reports he was initially very confused - even hallucinating.  Improving but she reports he often is off topic in conversation  VISION: Subjective report: reports change in peripheral vision Baseline vision: Wears glasses for reading only Visual history: cataracts, retinal issues in left eye  VISION ASSESSMENT: Impaired Eye alignment: Impaired: mild Ocular ROM: impaired Tracking/Visual pursuits: Decreased smoothness with horizontal tracking and Decreased smoothness with vertical tracking Saccades: undershoots and decreased speed of saccadic movements Convergence: Impaired:   Visual Fields: Left visual field deficits Diplopia assessment: present in Left gaze needs further testing - seems more apparent vertically.  Need to differentiate between gaze  stabilization and diploia  Patient has difficulty with following activities due to following visual impairments: reading, navigating environment  PERCEPTION: Impaired: Spatial orientation: posterior bias  PRAXIS: WFL     TODAY'S TREATMENT:                                                              01/10/23 Shower: Pt reports getting in/out of shower with use of RW for additional UE support when entering/exiting.  Pt reports utilizing grab bars for stability during shower, Mod I. Functional reach: engaged in reaching with LUE to obtain items at mid-low range to facilitate weight shifting to progress towards picking up items from floor.  OT providing CGA to close supervision when reaching outside BOS.  Pt then reaching to table top to place items in peg board.  Pt reports a few instances of dizziness, benefiting from focusing on far object for stability. Vision: placing pegs into peg board to copy peg board pattern at L to challenge visual acuity and single vision.  Pt reports double vision mostly at edges with ability to make single vision at midline.  OT increased challenge to placing straw into peg hole to further challenge single vision.  Pt frequently compensating with head turns and closing eye.      01/05/23 Card sorting: engaged in sorting cards by suit with L hand with focus on visual scanning/perception and coordination.  Pt requiring increased time/effort due to persistent double vision.  Pt able to match cards by suit with use of jumbo size cards, however reporting double vision with shapes on cards.   Resistance Clothespins: 2,4,6# with LUE for mid functional reaching and sustained pinch. OT placing clothespins to L and vertical dowel at midline to facilitate visual scanning both horizontally and vertically.  Pt reports difficulty due to double vision when attempting to place clothespins on vertical dowel and then transitioning to horizontal dowel.  Pt reporting increased difficulty as  height of clothespins increased due to increase of double vision with vertical scanning. Pt frequently closing one eye or turning head to attempt to make single vision.     01/03/23 NMR: engaged in placing progressively smaller pegs into pegboard with LUE with focus on visual attention and focus on making eyes focus on object to make single vision.  Pt reports double vision at midline and at all ranges on arc pegboard.  Progressed coordination aspect to picking up pegs one at a time to place into palm of hand and translate back to fingers tips to place in pegboard.   Vision: engaged in reaching to place peg in cone at midline with focus on making cone remain in single vision.  Pt with difficulty with changing visual attention between items at table top and back to cone at midline (around arms length). OT challenged pt to look down at pegs and then back up to cone to challenge ability to change focus between two items.  Pt with difficulty with alternating visual attention and returning items to single vision. Attempted to move target from midline, pt with increased double vision L > R of midline. Vision: Provided with neuro-ophthalmologist contacts for persistent visual impairments.  Encouraged pt to reach out for further assessment and treatment.    PATIENT EDUCATION: Education details: ongoing condition specific education Person educated: Patient Education method: Explanation Education comprehension: needs further education  HOME EXERCISE PROGRAM: Coordination handout  Access Code: [email or web address] URL: [email or web address] Date: 12/15/2022 Prepared by: [facility] - Outpatient  Rehab - [facility]  Exercises - Seated Horizontal Smooth Pursuit  - 2 x daily - 7 x weekly - 1 sets - 5 reps   GOALS: Goals reviewed with patient? No  SHORT TERM GOALS: Target date: 01/07/23  Patient will complete home exercise program designed to improve coordination in left UE Goal status: IN  PROGRESS  2.  Patient will complete an HEP designed to improve visual motor skills  Goal status: IN PROGRESS  3.  Patient will demonstrate improved awareness of visual management strategies for diplopia, and gaze stabilization.  Goal status: IN PROGRESS  4.  Patient will complete a shower with distant supervision  Goal status: MET - 01/10/23 reports completing Mod I  5.  Patient will reach to floor to obtain lightweight object to put on overhead shelf without loss of balance.    Goal status: IN PROGRESS   LONG TERM GOALS: Target date: 02/07/23  Patient will return to simple cooking - hot meal for he and his wife with intermittent min assistance and supervision  Goal  status: IN PROGRESS  2.  Patient will read news article in increased font (14+) x 5 min with 100% comprehension   Goal status: IN PROGRESS  3.  Patient will demonstrate awareness of return to driving recommendations  Goal status: IN PROGRESS  4.  Patient will report effective simple hand tool use for simple household repairs  Goal status: IN PROGRESS  5.  Patient will demonstrate at least 3 second improvement in 9 hole peg test LUE  Goal status: IN PROGRESS  6.  Patient will safely return to simple outside maintenance - gardening, mowing with supervision  Goal status: IN PROGRESS  ASSESSMENT:  CLINICAL IMPRESSION: Pt continues to require close CGA for ambulation with SPC due to increased double vision and dizziness post car ride to clinic.  Pt demonstrating improved ability and reports of obtaining single vision during table top tasks, however reports utilizing far point to orient back to single vision. OT reiterated rational of neuro ophthalmologist vs standard ophthalmologist to address double vision post stroke.  Encouraged pt's spouse to inquire for appropriate f/u.  PERFORMANCE DEFICITS: in functional skills including ADLs, IADLs, coordination, sensation, strength, pain, Fine motor control, Gross motor  control, mobility, balance, endurance, decreased knowledge of use of DME, vision, and UE functional use, cognitive skills including attention, energy/drive, memory, problem solving, safety awareness, and sequencing, and psychosocial skills including habits and routines and behaviors.   IMPAIRMENTS: are limiting patient from ADLs, IADLs, and leisure.   CO-MORBIDITIES: may have co-morbidities  that affects occupational performance. Patient will benefit from skilled OT to address above impairments and improve overall function.  MODIFICATION OR ASSISTANCE TO COMPLETE EVALUATION: Min-Moderate modification of tasks or assist with assess necessary to complete an evaluation.  OT OCCUPATIONAL PROFILE AND HISTORY: Detailed assessment: Review of records and additional review of physical, cognitive, psychosocial history related to current functional performance.  CLINICAL DECISION MAKING: Moderate - several treatment options, min-mod task modification necessary  REHAB POTENTIAL: Good  EVALUATION COMPLEXITY: Moderate    PLAN:  OT FREQUENCY: 2x/week  OT DURATION: 8 weeks  PLANNED INTERVENTIONS: self care/ADL training, therapeutic exercise, therapeutic activity, neuromuscular re-education, balance training, functional mobility training, patient/family education, cognitive remediation/compensation, visual/perceptual remediation/compensation, and DME and/or AE instructions  RECOMMENDED OTHER SERVICES: Possibly Neuro-optometry  CONSULTED AND AGREED WITH PLAN OF CARE: Patient and family member/caregiver  PLAN FOR NEXT SESSION: Needs stand balance training - dynamic.  Needs env't scanning, and gaze stabilization/ eye teaming activity.  Coordination.   Rosalio Loud, OT 01/10/2023, 1:32 PM

## 2023-01-10 NOTE — Therapy (Signed)
OUTPATIENT PHYSICAL THERAPY NEURO TREATMENT   [name] MRN: [id number] DOB:1950/09/17, 72 y.o., male Today's Date: 01/10/2023   PCP: [name], MD REFERRING PROVIDER: [name], MD >to send to PCP, [name]  END OF SESSION:  PT End of Session - 01/10/23 1452     Visit Number 8    Number of Visits 17    Date for PT Re-Evaluation 02/04/23    Authorization Type [employer]    Progress Note Due on Visit 10    PT Start Time 1450    PT Stop Time 1530    PT Time Calculation (min) 40 min    Equipment Utilized During Treatment Gait belt    Activity Tolerance Patient tolerated treatment well    Behavior During Therapy WFL for tasks assessed/performed               Past Medical History:  Diagnosis Date   Diabetes mellitus without complication (HCC)    Migraine    Stroke [facility])    Past Surgical History:  Procedure Laterality Date   BACK SURGERY     GIVENS CAPSULE STUDY N/A 05/05/2015   Procedure: GIVENS CAPSULE STUDY;  Surgeon: Charna Elizabeth, MD;  Location: [facility] ENDOSCOPY;  Service: Endoscopy;  Laterality: N/A;   LOOP RECORDER INSERTION N/A 11/29/2022   Procedure: LOOP RECORDER INSERTION;  Surgeon: Maurice Small, MD;  Location: [facility] INVASIVE CV LAB;  Service: Cardiovascular;  Laterality: N/A;   Patient Active Problem List   Diagnosis Date Noted   Essential hypertension 11/30/2022   Migraine 11/30/2022   Chronic kidney disease, stage 3a (HCC) 11/30/2022   Controlled type 2 diabetes mellitus without complication, without long-term current use of insulin (HCC) 11/30/2022   HLD (hyperlipidemia) 11/30/2022   Acute CVA (cerebrovascular accident) (HCC) 11/25/2022    ONSET DATE: 11/25/2022  REFERRING DIAG: I63.9 (ICD-10-CM) - Cerebrovascular accident (CVA), unspecified mechanism   THERAPY DIAG:  Unsteadiness on feet  Muscle weakness (generalized)  Dizziness and giddiness  Rationale for Evaluation and Treatment:  Rehabilitation  SUBJECTIVE:                                                                                                                                                                                             SUBJECTIVE STATEMENT: Doing ok.  Nothing new to report   Pt accompanied by: self  PERTINENT HISTORY: a-fib, DM, hx of migraines, hx of back surgery, multiple knee surgeries (per pt report)  PAIN:  Are you having pain? Yes: NPRS scale: 5/10 Pain location: headache Pain description: headache Aggravating factors: sunlight, noise Relieving factors: darkness, quietness  PRECAUTIONS: Fall and Other: loop recorder; requires supervision at all times  WEIGHT BEARING RESTRICTIONS: No  FALLS: Has patient fallen in last 6 months? Yes. Number of falls 1 with his initial CVA  LIVING ENVIRONMENT: Lives with: lives with their spouse Lives in: House/apartment Stairs: Yes: External: 3 steps; on right going up.  Home has basement, but he doesn't go down into basement. Has following equipment at home: Single point cane, Walker - 2 wheeled, and bed side commode  PLOF: Independent.  [place], mowing.  Enjoys seeing grandson play baseball.  PATIENT GOALS: To get back to normal  OBJECTIVE:   TODAY'S TREATMENT: 01/10/23 Activity Comments  Seated LE AROM 2x10   Step-up to overhead reach 1x10 6" box and cane for UE support--reports dizziness with looking up  BPPV position check -negative left/right roll test -left Dix-Hallpike--reports visual spinning -right sidelying: reports double vision with visual fixation but no nystagmus  Head turns to look at at target 5x left/right, refixation saccade with left head turns, not so with the right                    PATIENT EDUCATION: Education details: progress towards goals-improved balance, still at fall risk; use cane for short distances, RW still for long distances. Person educated: Patient Education  method: Explanation and Demonstration Education comprehension: verbalized understanding and returned demonstration    HOME EXERCISE PROGRAM Last updated: 12/17/22 Access Code: [email or web address] URL: [email or web address] Date: 12/17/2022 Prepared by: [facility] - Outpatient  Rehab - [facility]  Exercises - Seated March  - 1 x daily - 5 x weekly - 3 sets - 10 reps - Seated Long Arc Quad  - 1 x daily - 5 x weekly - 3 sets - 10 reps - Pencil Pushups  - 2 x daily - 7 x weekly - 1 sets - 5 reps - Sit to Stand with Counter Support  - 1 x daily - 5 x weekly - 2 sets - 10 reps - Seated Left Head Turns Vestibular Habituation  - 1 x daily - 5 x weekly - 2-3 sets - 30 sec hold - Seated Head Nod  - 1 x daily - 5 x weekly - 2-3 sets - 30 sec hold      Below measures were taken at time of initial evaluation unless otherwise specified:    VESTIBULAR ASSESSMENT   GENERAL OBSERVATION: No acute distress    SYMPTOM BEHAVIOR:   Subjective history: Recent hx of CVA and since then he feels like he is being pulled backward or to the side.  Wife (at eval) notes veering with gait.   Non-Vestibular symptoms: diplopia, migraine symptoms, and CVA 11/25/22   Type of dizziness: Imbalance (Disequilibrium) and Unsteady with head/body turns.  Feels like he is going backwards   Frequency: throughout the day   Duration: with movement   Aggravating factors: Induced by position change: sit to stand and Induced by motion: occur when walking   Relieving factors: slow movements   Progression of symptoms: unchanged   OCULOMOTOR EXAM:   Ocular Alignment: abnormal and L eye slightly lower   Ocular ROM: No Limitations and double vision with upper motion   Spontaneous Nystagmus: absent   Gaze-Induced Nystagmus: absent   Smooth Pursuits: intact   Saccades: slow, diplopia in upward vertical direction   Convergence/Divergence: 30 inches, then 19 inches (notes blurry vision farther away)    VESTIBULAR -  OCULAR REFLEX:  Slow VOR: Comment: double vision horizontal and vertical, rates dizziness as 4/10   VOR Cancellation: Comment: reports dizziness 8/10   Head-Impulse Test: NT          -------------------------------------------------------------- Objective measures below taken at the initial evaluation:  DIAGNOSTIC FINDINGS: 1. 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 2. Underlying mild age-related cerebral atrophy with moderate chronic microvascular ischemic disease.  COGNITION: Overall cognitive status: Impaired   SENSATION: Light touch: WFL and reports sometimes having tingling on left foot  COORDINATION: Decreased LLE  MUSCLE TONE: WFL BLEs  VITALS:  137/85, HR 71  POSTURE: rounded shoulders and posterior pelvic tilt  LOWER EXTREMITY ROM:   in sitting, Active ROM WFL BLEs   LOWER EXTREMITY MMT:    MMT Right Eval Left Eval  Hip flexion 4+ 4  Hip extension    Hip abduction    Hip adduction    Hip internal rotation    Hip external rotation    Knee flexion 4 3+  Knee extension 4 3+  Ankle dorsiflexion 4 3+  Ankle plantarflexion    Ankle inversion    Ankle eversion    (Blank rows = not tested)   TRANSFERS: Assistive device utilized: None  Sit to stand: CGA Stand to sit: CGA Slowed, ataxic movements with transfers GAIT: Gait pattern: step through pattern, shuffling, ataxic, and wide BOS; veers to L Distance walked: 60 ft Assistive device utilized: Environmental consultant - 2 wheeled Level of assistance: CGA Comments: Tends to vent to L  FUNCTIONAL TESTS:  5 times sit to stand: 25.34 sec without UE support 10 meter walk test: NT Berg Balance Scale: 22/56     PATIENT SURVEYS:  FOTO Intake score 50; predicted 69    GOALS: Goals reviewed with patient? Yes  SHORT TERM GOALS: Target date: 01/07/2023  Pt will be supervision with HEP for improved balance, strength, gait. Baseline: Goal status: IN PROGRESS  2.  Pt will improve  5x sit<>stand to less than or equal to 20 sec to demonstrate improved functional strength and transfer efficiency.  Baseline: 25.34 sec Goal status: IN PROGRESS  3.  Pt will improve Berg score to at least 30/56 to decrease fall risk. Baseline: 22/56>34/56 Goal status: MET  4.  Further vestibular testing to be completed and goal set as appropriate. Baseline:  Goal status: IN PROGRESS   LONG TERM GOALS: Target date: 02/04/2023  Pt will be supervision with HEP for improved strength, balance, gait. Baseline:  Goal status: IN PROGRESS  2.  Pt will improve 5x sit<>stand to less than or equal to 15 sec to demonstrate improved functional strength and transfer efficiency. Baseline: 25.34 sec Goal status: IN PROGRESS  3.  Pt will improve Berg score to at least 40/56 to decrease fall risk. Baseline: 22/56 Goal status: IN PROGRESS  4.  FOTO score to improve to 69 to demo improved overall functional mobility. Baseline: 50 Goal status: IN PROGRESS  5.  Further vestibular testing to be completed/goal written as appropriate. Baseline:  Goal status: IN PROGRESS  6.  Pt will ambulate at least 1000 ft indoor and outdoor surfaces, with appropriate assistive device, mod I, for outdoor and community gait. Baseline:  Goal status: IN PROGRESS  ASSESSMENT:  CLINICAL IMPRESSION: Reports feeling of dizziness with looking up prompting positional vertigo assessments, only positive response noted with left Dix-Hallpike position pt reports visual distortion when fixated on target but notes this is much the same response as when he stands up from sitting  and experiences the same double vision vs spinning.  Head turns to the left to target reveal consistent, large amplitude refixation saccade which was not demonstrated with right head turns.  Patient would benefit from continued PT sessions to meet STG/LTG.  OBJECTIVE IMPAIRMENTS: Abnormal gait, decreased balance, decreased knowledge of use of DME,  decreased mobility, difficulty walking, decreased ROM, decreased strength, decreased safety awareness, dizziness, and postural dysfunction.   ACTIVITY LIMITATIONS: carrying, lifting, bending, standing, squatting, transfers, bathing, toileting, dressing, and locomotion level  PARTICIPATION LIMITATIONS: meal prep, cleaning, laundry, community activity, and yard work  PERSONAL FACTORS: 3+ comorbidities: see PMH; also potential visual/ vestibular component to pt's balance that needs to be further assessed  are also affecting patient's functional outcome.   REHAB POTENTIAL: Good  CLINICAL DECISION MAKING: Evolving/moderate complexity  EVALUATION COMPLEXITY: Moderate  PLAN:  PT FREQUENCY: 2x/week  PT DURATION: 8 weeks plus eval  PLANNED INTERVENTIONS: Therapeutic exercises, Therapeutic activity, Neuromuscular re-education, Balance training, Gait training, Patient/Family education, Self Care, Vestibular training, Canalith repositioning, and DME instructions  PLAN FOR NEXT SESSION: assess orthostatics? Add to HEP:  tandem stance, SLS, Romberg.  Progress balance and strength, gait with cane; continue working on convergence, VOR, habituation in functional activities   2:53 PM, 01/10/23 M. Shary Decamp, PT, DPT Physical Therapist- [place] Office Number: [phone]

## 2023-01-12 ENCOUNTER — Ambulatory Visit: Payer: Medicare Other

## 2023-01-12 ENCOUNTER — Ambulatory Visit: Payer: Medicare Other | Admitting: Occupational Therapy

## 2023-01-14 ENCOUNTER — Ambulatory Visit: Payer: Medicare Other

## 2023-01-14 DIAGNOSIS — R41841 Cognitive communication deficit: Secondary | ICD-10-CM | POA: Diagnosis not present

## 2023-01-14 DIAGNOSIS — R471 Dysarthria and anarthria: Secondary | ICD-10-CM

## 2023-01-14 NOTE — Therapy (Signed)
OUTPATIENT SPEECH LANGUAGE PATHOLOGY TREATMENT   [name] MRN: [id number] DOB:1950/11/03, 72 y.o., male Today's Date: 01/14/2023  PCP: Antony Haste, MD REFERRING PROVIDER: [name], MD  Antony Haste MD - doc)  END OF SESSION:  End of Session - 01/14/23 1247     Visit Number 8    Number of Visits 25    Date for SLP Re-Evaluation 03/09/23    SLP Start Time 0850    SLP Stop Time  0930    SLP Time Calculation (min) 40 min    Activity Tolerance Patient tolerated treatment well               Past Medical History:  Diagnosis Date   Diabetes mellitus without complication (HCC)    Migraine    Stroke [facility])    Past Surgical History:  Procedure Laterality Date   BACK SURGERY     GIVENS CAPSULE STUDY N/A 05/05/2015   Procedure: GIVENS CAPSULE STUDY;  Surgeon: Charna Elizabeth, MD;  Location: [facility] ENDOSCOPY;  Service: Endoscopy;  Laterality: N/A;   LOOP RECORDER INSERTION N/A 11/29/2022   Procedure: LOOP RECORDER INSERTION;  Surgeon: Maurice Small, MD;  Location: [facility] INVASIVE CV LAB;  Service: Cardiovascular;  Laterality: N/A;   Patient Active Problem List   Diagnosis Date Noted   Essential hypertension 11/30/2022   Migraine 11/30/2022   Chronic kidney disease, stage 3a (HCC) 11/30/2022   Controlled type 2 diabetes mellitus without complication, without long-term current use of insulin (HCC) 11/30/2022   HLD (hyperlipidemia) 11/30/2022   Acute CVA (cerebrovascular accident) (HCC) 11/25/2022    ONSET DATE: 11/25/22   REFERRING DIAG: CVA  THERAPY DIAG:  Dysarthria and anarthria  Cognitive communication deficit  Rationale for Evaluation and Treatment: Rehabilitation  SUBJECTIVE:   SUBJECTIVE STATEMENT: "After we've been gone all morning I get home and I'm washed. She lets me rest a bit and then we have dinner at 4-5 o clock. The other day my daughter in law and her mother came and she (mother) had a lot of trouble understanding  me."  Pt accompanied by: self  PERTINENT HISTORY: presented 11/25/22 with AMS, blurred vision, headache, and L-sided numbness and weakness. 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 PMH: DM, migraine  PAIN:  Are you having pain? Yes: NPRS scale: 6/10 Pain location: head Pain description: headache Aggravating factors: activity Relieving factors: rest  PATIENT GOALS: Improve communication skills  OBJECTIVE:   DIAGNOSTIC FINDINGS:  BSE 11/26/22 Clinical Impression   Pt reports no swallowing difficulties PTA. He has L side neglect that affects his ability to attend to POs presented on that side and appropriately raise them to his mouth. Oral motor exam revealed L side asymmetry and mild difficulty coodinating some movements. Pt exhibited difficulty forming a labial seal given thin liquids via straw and purees via spoon as well as oral holding during trials of solid textures. He had no observed s/s of aspiration throughout PO trials. Due to pt's awareness deficits and overall mentation, recommend Dys 2 (finely chopped) and thin liquids with full supervision. SLP will f/u as able to address potential for trials of upgraded textures as mentation improves.    COGNITION: Overall cognitive status: Impaired Areas of impairment:  Attention: Impaired: Sustained, Selective, Alternating, Divided Memory: Impaired: Short term Executive function: Impaired: Slow processing Functional deficits: Pt states he is talking and then loses train of thought often. He is no longer managing his medication (see "S" statement)  nor his appointments. "If I had a calendar (in the kitchen) it would help." Formal cognitive assessment may be warranted in the next few sessions.     STANDARDIZED ASSESSMENTS: CLQT: Attention: WNL, Memory: Mild, Executive Function: WNL, Language: WNL, Visuospatial Skills: WNL, and Clock Drawing: Mild Pt scored under the "cut score" for three  subtests: clock drawing, mazes, and design generation. These subtests do rely somewhat on vision -which, at this time pt complains of "double-vision". Anecdotally, pt has some sx of focused attention deficits such as overly verbose at times and losing track of his point, or the topic, and exhibiting rambling speech. SLP to cont to monitor.    PATIENT REPORTED OUTCOME MEASURES (PROM): Cognitive Function: 71/140 for higher scores indicating better cognition/better QOL relative to cognition. Score of "1" or "occurs very often" with the following areas: Indicating making simple mistakes easily, difficulty thinking of specific words, reading something several times to understand it, alternating attention, remembering someone's name, slower reaction time in conversation, more difficulty forming thoughts, slow thinking, working hard to pay attention, and trouble concentrating. With Communication Effectiveness Survey: 16/32 with higher scores indicating better QOL/ better communication effectiveness (only "1" being having a conversation with someone across a room) .  TODAY'S TREATMENT:                                                                                                                                         DATE:  01/14/23: Pt expresses to SLP the details of his eye appointment yesterday without extra time necessary for recalling details. SLP inquired directly about his intelligibility after dinner and pt stated he still has more difficulty with people understanding him. Pt is completing HEP once/day and SLP encouraged him to complete BID, and explained rationale. SLP taught pt loud /a/ today and pt average was mid 70s dB with SLP mod cues for increasing volume - noted mild vocal tremor with these productions. Pt had more success with loudness when paired with initial "HEY!". SLP told pt to complete 10 loud /a/ BID.  01/10/23: Pt volume was sub 70 dB for entire session with verbalization. Pt returned his  PROMs today and scored above. He recalled he needed to turn in his PROMs and homework with extra time - presented to SLP 15 minutes into session: "I  had to have [name] read off some of the questions because they were too small. My bifocals are really hard to see with now." 100% success with homework.  Pt tells SLP he feels his speech intelligibility is much better and now the volume of his speech is the primary area making people have to ask him to repeat. Sent note home with pt to ascertain if pt's loudness was more of a deficit than pt's speech intelligibility, which has improved dramatically since initial eval.  Pt stated he hasn't coughed with meals in a while. Today in session he  had multiple consecutive sips H2O without any overt s/sx difficulty.  01/05/23: Pt did not complete PROMs , nor homework due to ill yesterday. Very dizzy upon entry to ST today due to ride to therapies today.  SLP measured pt's voice volume - average mid 60s dB. SLP inquired and pt stated wife told him today "for first time in a long time that I was mumbling." SLP educated pt about energy conservation and management; pt demonstrated understanding.  01/03/23: SLP completed CLQT, with results as above. Pt began session with 5 1/4 minute explanation about why he had experience with a cane, demonstrating possible deficit with focused or internal selective attention.  Pt's speech volume was measured as WNL today in 12 minutes of focused conversation (including initial conversation about pt's cane experiences). PT without anomia today in this session.  12/22/22: Two coughing episodes since last session - one Sunday on tea and one today with water. SLP to continue to track these. Pt without overt s/sx aspiration PNA today. SLP cont'd with pt's dysarthria HEP today and he req'd min-mod A usually for stressing/strong lingual sounds on the HEP, and for procedure. Pt stated he was performing HEP once/day.  CLQT initiated today. By end of  session (15 minutes with CLQT) pt's HA increased in pain from 4/10 to 6/10. During the last 15 minutes of session (during CLQT) pt demonstrated word finding difficulty x2; Extra time (<3 seconds) necessary for pt to be successful. Prior to this, pt without difficulty in min-mod complex conversation.   12/17/22: Perform CLQT next session. Pt took medicines this morning and he did them WNL. MD visit yesterday brought new meds and deleted some others and pt to pick those up today.  SLP engaged pt in salient min-mod complex conversation and pt had 4 anomic errors which he compensated for. SLP provided him homework for word finding.  12/15/22: Pt did not bring in cough log - forgot he was supposed to do this. SLP made a rudimentary log for pt to take. SLP discussed his medication administration with him today and see pt instructions for the result of this conversation. SLP educated how pain can interfere with rehab treatment and encouraged pt to take pain reliever if necessary prior to rehab appointments.  Pt with dysnomia (Daughter instead of daughter in law, and carport instead of garage) which were both corrected by pt.   12/09/22 (eval): SLP went through portion of pt's HEP for oral strengthening. Full program will need to be completed with pt next time.  PATIENT EDUCATION: Education details: Eval results, HEP for lingual weakness and labial weakness Person educated: Patient and Spouse Education method: Explanation, Demonstration, Verbal cues, and Handouts Education comprehension: verbalized understanding, returned demonstration, verbal cues required, and needs further education   GOALS: Goals reviewed with patient? Yes  SHORT TERM GOALS: Target date: 01/16/23  Pt will report improved communication (speech intelligibility) after dinner between 3 sessions Baseline: 01/10/23 Goal status: Ongoing  2.  Pt will bring cough log to two of first 4 sessions; pt will be referred for MBS if clinically  warranted Baseline: 12/22/22 Goal status: Partially met  3.  Pt will undergo cognitive assessment in first 3 sessions Baseline:  Goal status: Met  4.  Pt speech volume in 5 minutes simple conversation will improve to WNL in 3 sessions Baseline: 01/03/23, 01/10/23 Goal status: Met   LONG TERM GOALS: Target date: 03/09/23  Pt's PROM measures will increase compared to first measurement during first therapy session Baseline:  Goal status: Ongoing  2.  Pt speech volume in 10 minutes mod complex conversation will improve to WNL in 3 sessions Baseline: 01/10/23 Goal status: Ongoing  3.  Pt will report improved communication (speech intelligibility) after dinner between 3 sessions (after 01/16/23) Baseline:  Goal status: Ongoing    ASSESSMENT:  CLINICAL IMPRESSION: Patient is a 72 y.o. male who is seen in ST for speech intelligibility and cognitive communication abilities following a CVA 11-25-22. MBS does not appear necessary at this time as pt does not recall coughing episodes.   OBJECTIVE IMPAIRMENTS: include attention, memory, expressive language, receptive language, and dysarthria. These impairments are limiting patient from managing medications, managing appointments, household responsibilities, ADLs/IADLs, effectively communicating at home and in community, and safety when swallowing. Factors affecting potential to achieve goals and functional outcome are  none noted today . Patient will benefit from skilled SLP services to address above impairments and improve overall function.  REHAB POTENTIAL: Good  PLAN:  SLP FREQUENCY: 2x/week  SLP DURATION: 12 weeks  PLANNED INTERVENTIONS: Environmental controls, Cueing hierachy, Cognitive reorganization, Internal/external aids, Oral motor exercises, Functional tasks, SLP instruction and feedback, Compensatory strategies, Patient/family education, and MBS    Donyae Kohn, CCC-SLP 01/14/2023, 12:47 PM

## 2023-01-19 ENCOUNTER — Ambulatory Visit: Payer: Medicare Other | Admitting: Physical Therapy

## 2023-01-19 ENCOUNTER — Ambulatory Visit: Payer: Medicare Other | Admitting: Occupational Therapy

## 2023-01-19 ENCOUNTER — Ambulatory Visit: Payer: Medicare Other

## 2023-01-19 DIAGNOSIS — R2681 Unsteadiness on feet: Secondary | ICD-10-CM

## 2023-01-19 DIAGNOSIS — M6281 Muscle weakness (generalized): Secondary | ICD-10-CM

## 2023-01-19 DIAGNOSIS — R471 Dysarthria and anarthria: Secondary | ICD-10-CM

## 2023-01-19 DIAGNOSIS — R2689 Other abnormalities of gait and mobility: Secondary | ICD-10-CM

## 2023-01-19 DIAGNOSIS — R278 Other lack of coordination: Secondary | ICD-10-CM

## 2023-01-19 DIAGNOSIS — R41841 Cognitive communication deficit: Secondary | ICD-10-CM | POA: Diagnosis not present

## 2023-01-19 DIAGNOSIS — R41842 Visuospatial deficit: Secondary | ICD-10-CM

## 2023-01-19 DIAGNOSIS — R42 Dizziness and giddiness: Secondary | ICD-10-CM

## 2023-01-19 NOTE — Therapy (Signed)
OUTPATIENT SPEECH LANGUAGE PATHOLOGY TREATMENT   [name] MRN: [id number] DOB:07-Feb-1951, 72 y.o., male Today's Date: 01/19/2023  PCP: Sean Haste, MD REFERRING PROVIDER: [name], MD  Sean Haste MD - doc)  END OF SESSION:  End of Session - 01/19/23 1532     Visit Number 9    Number of Visits 25    Date for SLP Re-Evaluation 03/09/23    SLP Start Time 1451    SLP Stop Time  1530    SLP Time Calculation (min) 39 min    Activity Tolerance Patient tolerated treatment well                Past Medical History:  Diagnosis Date   Diabetes mellitus without complication (HCC)    Migraine    Stroke [facility])    Past Surgical History:  Procedure Laterality Date   BACK SURGERY     GIVENS CAPSULE STUDY N/A 05/05/2015   Procedure: GIVENS CAPSULE STUDY;  Surgeon: Sean Elizabeth, MD;  Location: [facility] ENDOSCOPY;  Service: Endoscopy;  Laterality: N/A;   LOOP RECORDER INSERTION N/A 11/29/2022   Procedure: LOOP RECORDER INSERTION;  Surgeon: Sean Small, MD;  Location: [facility] INVASIVE CV LAB;  Service: Cardiovascular;  Laterality: N/A;   Patient Active Problem List   Diagnosis Date Noted   Essential hypertension 11/30/2022   Migraine 11/30/2022   Chronic kidney disease, stage 3a (HCC) 11/30/2022   Controlled type 2 diabetes mellitus without complication, without long-term current use of insulin (HCC) 11/30/2022   HLD (hyperlipidemia) 11/30/2022   Acute CVA (cerebrovascular accident) (HCC) 11/25/2022    ONSET DATE: 11/25/22   REFERRING DIAG: CVA  THERAPY DIAG:  Dysarthria and anarthria  Cognitive communication deficit  Rationale for Evaluation and Treatment: Rehabilitation  SUBJECTIVE:   SUBJECTIVE STATEMENT: "After we've been gone all morning I get home and I'm washed. She lets me rest a bit and then we have dinner at 4-5 o clock. The other day my daughter in law and her mother came and she (mother) had a lot of trouble understanding  me."  Pt accompanied by: self  PERTINENT HISTORY: presented 11/25/22 with AMS, blurred vision, headache, and L-sided numbness and weakness. MRI revealed patchy [name] volume acute ischemic posterior right MCA territory infarct. PMH: DM, migraine  PAIN:  Are you having pain? Yes: NPRS scale: 6/10 Pain location: head Pain description: headache Aggravating factors: activity Relieving factors: rest  PATIENT GOALS: Improve communication skills  OBJECTIVE:   DIAGNOSTIC FINDINGS:  BSE 11/26/22 Clinical Impression   Pt reports no swallowing difficulties PTA. He has L side neglect that affects his ability to attend to POs presented on that side and appropriately raise them to his mouth. Oral motor exam revealed L side asymmetry and mild difficulty coodinating some movements. Pt exhibited difficulty forming a labial seal given thin liquids via straw and purees via spoon as well as oral holding during trials of solid textures. He had no observed s/s of aspiration throughout PO trials. Due to pt's awareness deficits and overall mentation, recommend Dys 2 (finely chopped) and thin liquids with full supervision. SLP will f/u as able to address potential for trials of upgraded textures as mentation improves.    COGNITION: Overall cognitive status: Impaired Areas of impairment:  Attention: Impaired: Sustained, Selective, Alternating, Divided Memory: Impaired: Short term Executive function: Impaired: Slow processing Functional deficits: Pt states he is talking and then loses train of thought often. He is no longer managing his medication (see "S"  statement) nor his appointments. "If I had a calendar (in the kitchen) it would help." Formal cognitive assessment may be warranted in the next few sessions.     STANDARDIZED ASSESSMENTS: CLQT: Attention: WNL, Memory: Mild, Executive Function: WNL, Language: WNL, Visuospatial Skills: WNL, and Clock Drawing: Mild Pt scored under the "cut score" for three  subtests: clock drawing, mazes, and design generation. These subtests do rely somewhat on vision -which, at this time pt complains of "double-vision". Anecdotally, pt has some sx of focused attention deficits such as overly verbose at times and losing track of his point, or the topic, and exhibiting rambling speech. SLP to cont to monitor.    PATIENT REPORTED OUTCOME MEASURES (PROM): Cognitive Function: 71/140 for higher scores indicating better cognition/better QOL relative to cognition. Score of "1" or "occurs very often" with the following areas: Indicating making simple mistakes easily, difficulty thinking of specific words, reading something several times to understand it, alternating attention, remembering someone's name, slower reaction time in conversation, more difficulty forming thoughts, slow thinking, working hard to pay attention, and trouble concentrating. With Communication Effectiveness Survey: 16/32 with higher scores indicating better QOL/ better communication effectiveness (only "1" being having a conversation with someone across a room) .  TODAY'S TREATMENT:                                                                                                                                         DATE:  01/19/23: [name] enters today with fine details about the weekend without cues. Pt stated he completed "hey - ahhhh" x5 with average mid 70s dB with mod cues for louder production encouraged..Average in conversational segments of 10 minutes average mid-upper 60s. Pt stated he had not had any difficulty with people understanding him since previous session.   01/14/23: Pt expresses to SLP the details of his eye appointment yesterday without extra time necessary for recalling details. SLP inquired directly about his intelligibility after dinner and pt stated he still has more difficulty with people understanding him. Pt is completing HEP once/day and SLP encouraged him to complete BID, and  explained rationale. SLP taught pt loud /a/ today and pt average was mid 70s dB with SLP mod cues for increasing volume - noted mild vocal tremor with these productions. Pt had more success with loudness when paired with initial "HEY!". SLP told pt to complete 10 loud /a/ BID.  01/10/23: Pt volume was sub 70 dB for entire session with verbalization. Pt returned his PROMs today and scored above. He recalled he needed to turn in his PROMs and homework with extra time - presented to SLP 15 minutes into session: "I  had to have [name] read off some of the questions because they were too [name]. My bifocals are really hard to see with now." 100% success with homework.  Pt tells SLP he feels his speech intelligibility is much better and  now the volume of his speech is the primary area making people have to ask him to repeat. Sent note home with pt to ascertain if pt's loudness was more of a deficit than pt's speech intelligibility, which has improved dramatically since initial eval.  Pt stated he hasn't coughed with meals in a while. Today in session he had multiple consecutive sips H2O without any overt s/sx difficulty.  01/05/23: Pt did not complete PROMs , nor homework due to ill yesterday. Very dizzy upon entry to ST today due to ride to therapies today.  SLP measured pt's voice volume - average mid 60s dB. SLP inquired and pt stated wife told him today "for first time in a long time that I was mumbling." SLP educated pt about energy conservation and management; pt demonstrated understanding.  01/03/23: SLP completed CLQT, with results as above. Pt began session with 5 1/4 minute explanation about why he had experience with a cane, demonstrating possible deficit with focused or internal selective attention.  Pt's speech volume was measured as WNL today in 12 minutes of focused conversation (including initial conversation about pt's cane experiences). PT without anomia today in this session.  12/22/22: Two coughing  episodes since last session - one Sunday on tea and one today with water. SLP to continue to track these. Pt without overt s/sx aspiration PNA today. SLP cont'd with pt's dysarthria HEP today and he req'd min-mod A usually for stressing/strong lingual sounds on the HEP, and for procedure. Pt stated he was performing HEP once/day.  CLQT initiated today. By end of session (15 minutes with CLQT) pt's HA increased in pain from 4/10 to 6/10. During the last 15 minutes of session (during CLQT) pt demonstrated word finding difficulty x2; Extra time (<3 seconds) necessary for pt to be successful. Prior to this, pt without difficulty in min-mod complex conversation.   12/17/22: Perform CLQT next session. Pt took medicines this morning and he did them WNL. MD visit yesterday brought new meds and deleted some others and pt to pick those up today.  SLP engaged pt in salient min-mod complex conversation and pt had 4 anomic errors which he compensated for. SLP provided him homework for word finding.  12/15/22: Pt did not bring in cough log - forgot he was supposed to do this. SLP made a rudimentary log for pt to take. SLP discussed his medication administration with him today and see pt instructions for the result of this conversation. SLP educated how pain can interfere with rehab treatment and encouraged pt to take pain reliever if necessary prior to rehab appointments.  Pt with dysnomia (Daughter instead of daughter in law, and carport instead of garage) which were both corrected by pt.   12/09/22 (eval): SLP went through portion of pt's HEP for oral strengthening. Full program will need to be completed with pt next time.  PATIENT EDUCATION: Education details: Eval results, HEP for lingual weakness and labial weakness Person educated: Patient and Spouse Education method: Explanation, Demonstration, Verbal cues, and Handouts Education comprehension: verbalized understanding, returned demonstration, verbal cues  required, and needs further education   GOALS: Goals reviewed with patient? Yes  SHORT TERM GOALS: Target date: 01/16/23  Pt will report improved communication (speech intelligibility) after dinner between 3 sessions Baseline: 01/10/23 Goal status: Partially met  2.  Pt will bring cough log to two of first 4 sessions; pt will be referred for MBS if clinically warranted Baseline: 12/22/22 Goal status: Partially met  3.  Pt will  undergo cognitive assessment in first 3 sessions Baseline:  Goal status: Met  4.  Pt speech volume in 5 minutes simple conversation will improve to WNL in 3 sessions Baseline: 01/03/23, 01/10/23 Goal status: Met   LONG TERM GOALS: Target date: 03/09/23  Pt's PROM measures will increase compared to first measurement during first therapy session Baseline:  Goal status: Ongoing  2.  Pt speech volume in 10 minutes mod complex conversation will improve to WNL in 3 sessions Baseline: 01/10/23 Goal status: Ongoing  3.  Pt will report improved communication (speech intelligibility) after dinner between 3 sessions (after 01/16/23) Baseline:  Goal status: Ongoing    ASSESSMENT:  CLINICAL IMPRESSION: Patient is a 72 y.o. male who is seen in ST for speech intelligibility and cognitive communication abilities following a CVA 11-25-22. MBS does not appear necessary at this time as pt does not recall coughing episodes.   OBJECTIVE IMPAIRMENTS: include attention, memory, expressive language, receptive language, and dysarthria. These impairments are limiting patient from managing medications, managing appointments, household responsibilities, ADLs/IADLs, effectively communicating at home and in community, and safety when swallowing. Factors affecting potential to achieve goals and functional outcome are  none noted today . Patient will benefit from skilled SLP services to address above impairments and improve overall function.  REHAB POTENTIAL: Good  PLAN:  SLP  FREQUENCY: 2x/week  SLP DURATION: 12 weeks  PLANNED INTERVENTIONS: Environmental controls, Cueing hierachy, Cognitive reorganization, Internal/external aids, Oral motor exercises, Functional tasks, SLP instruction and feedback, Compensatory strategies, Patient/family education, and MBS    Shereka Lafortune, CCC-SLP 01/19/2023, 3:32 PM

## 2023-01-19 NOTE — Therapy (Signed)
OUTPATIENT PHYSICAL THERAPY NEURO TREATMENT   [name] MRN: [id number] DOB:[birth year], 72 y.o., male Today's Date: 01/19/2023   PCP: [name], MD REFERRING PROVIDER: [name], MD >to send to PCP, [name]  END OF SESSION:  PT End of Session - 01/19/23 1314     Visit Number 9    Number of Visits 17    Date for PT Re-Evaluation 02/04/23    Authorization Type [employer]    Progress Note Due on Visit 10    PT Start Time 1315    PT Stop Time 1400    PT Time Calculation (min) 45 min    Equipment Utilized During Treatment Gait belt    Activity Tolerance Patient tolerated treatment well    Behavior During Therapy WFL for tasks assessed/performed               Past Medical History:  Diagnosis Date   Diabetes mellitus without complication (HCC)    Migraine    Stroke [facility])    Past Surgical History:  Procedure Laterality Date   BACK SURGERY     GIVENS CAPSULE STUDY N/A 05/05/2015   Procedure: GIVENS CAPSULE STUDY;  Surgeon: Charna Elizabeth, MD;  Location: [facility] ENDOSCOPY;  Service: Endoscopy;  Laterality: N/A;   LOOP RECORDER INSERTION N/A 11/29/2022   Procedure: LOOP RECORDER INSERTION;  Surgeon: Maurice Small, MD;  Location: [facility] INVASIVE CV LAB;  Service: Cardiovascular;  Laterality: N/A;   Patient Active Problem List   Diagnosis Date Noted   Essential hypertension 11/30/2022   Migraine 11/30/2022   Chronic kidney disease, stage 3a (HCC) 11/30/2022   Controlled type 2 diabetes mellitus without complication, without long-term current use of insulin (HCC) 11/30/2022   HLD (hyperlipidemia) 11/30/2022   Acute CVA (cerebrovascular accident) (HCC) 11/25/2022    ONSET DATE: 11/25/2022  REFERRING DIAG: I63.9 (ICD-10-CM) - Cerebrovascular accident (CVA), unspecified mechanism   THERAPY DIAG:  Unsteadiness on feet  Muscle weakness (generalized)  Dizziness and giddiness  Other abnormalities of gait and mobility  Rationale for  Evaluation and Treatment: Rehabilitation  SUBJECTIVE:                                                                                                                                                                                             SUBJECTIVE STATEMENT: Having a left side HA, behind the left eye and temple  Pt accompanied by: self  PERTINENT HISTORY: a-fib, DM, hx of migraines, hx of back surgery, multiple knee surgeries (per pt report)  PAIN:  Are you having pain? Yes: NPRS scale: 5/10 Pain location: headache Pain description:  headache Aggravating factors: sunlight, noise Relieving factors: darkness, quietness  PRECAUTIONS: Fall and Other: loop recorder; requires supervision at all times  WEIGHT BEARING RESTRICTIONS: No  FALLS: Has patient fallen in last 6 months? Yes. Number of falls 1 with his initial CVA  LIVING ENVIRONMENT: Lives with: lives with their spouse Lives in: House/apartment Stairs: Yes: External: 3 steps; on right going up.  Home has basement, but he doesn't go down into basement. Has following equipment at home: Single point cane, Walker - 2 wheeled, and bed side commode  PLOF: Independent.  [place], mowing.  Enjoys seeing grandson play baseball.  PATIENT GOALS: To get back to normal  OBJECTIVE:   TODAY'S TREATMENT: 01/19/23 Activity Comments  NU-step level 4 x 8 min speed intervals. HR at end 120 bpm 30 sec 100-110 SPM; 60 sec 70-80 SPM  Brock string 6x10 sec rounds 1st bead 20" 2nd bead 10" from 1st 3rd bead 10" from 2nd Incr HA from 5 to 6/10  retrowalking X 2 min  sidestepping X 2 min           TODAY'S TREATMENT: 01/10/23 Activity Comments  Seated LE AROM 2x10   Step-up to overhead reach 1x10 6" box and cane for UE support--reports dizziness with looking up  BPPV position check -negative left/right roll test -left Dix-Hallpike--reports visual spinning -right sidelying: reports double vision with visual  fixation but no nystagmus  Head turns to look at at target 5x left/right, refixation saccade with left head turns, not so with the right                    PATIENT EDUCATION: Education details: progress towards goals-improved balance, still at fall risk; use cane for short distances, RW still for long distances. Person educated: Patient Education method: Explanation and Demonstration Education comprehension: verbalized understanding and returned demonstration    HOME EXERCISE PROGRAM Last updated: 12/17/22 Access Code: [email or web address] URL: [email or web address] Date: 12/17/2022 Prepared by: [facility] - Outpatient  Rehab - [facility]  Exercises - Seated March  - 1 x daily - 5 x weekly - 3 sets - 10 reps - Seated Long Arc Quad  - 1 x daily - 5 x weekly - 3 sets - 10 reps - Pencil Pushups  - 2 x daily - 7 x weekly - 1 sets - 5 reps - Sit to Stand with Counter Support  - 1 x daily - 5 x weekly - 2 sets - 10 reps - Seated Left Head Turns Vestibular Habituation  - 1 x daily - 5 x weekly - 2-3 sets - 30 sec hold - Seated Head Nod  - 1 x daily - 5 x weekly - 2-3 sets - 30 sec hold      Below measures were taken at time of initial evaluation unless otherwise specified:    VESTIBULAR ASSESSMENT   GENERAL OBSERVATION: No acute distress    SYMPTOM BEHAVIOR:   Subjective history: Recent hx of CVA and since then he feels like he is being pulled backward or to the side.  Wife (at eval) notes veering with gait.   Non-Vestibular symptoms: diplopia, migraine symptoms, and CVA 11/25/22   Type of dizziness: Imbalance (Disequilibrium) and Unsteady with head/body turns.  Feels like he is going backwards   Frequency: throughout the day   Duration: with movement   Aggravating factors: Induced by position change: sit to stand and Induced by motion: occur when walking  Relieving factors: slow movements   Progression of symptoms: unchanged   OCULOMOTOR EXAM:   Ocular  Alignment: abnormal and L eye slightly lower   Ocular ROM: No Limitations and double vision with upper motion   Spontaneous Nystagmus: absent   Gaze-Induced Nystagmus: absent   Smooth Pursuits: intact   Saccades: slow, diplopia in upward vertical direction   Convergence/Divergence: 30 inches, then 19 inches (notes blurry vision farther away)    VESTIBULAR - OCULAR REFLEX:    Slow VOR: Comment: double vision horizontal and vertical, rates dizziness as 4/10   VOR Cancellation: Comment: reports dizziness 8/10   Head-Impulse Test: NT          -------------------------------------------------------------- Objective measures below taken at the initial evaluation:  DIAGNOSTIC FINDINGS: 1. 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 2. Underlying mild age-related cerebral atrophy with moderate chronic microvascular ischemic disease.  COGNITION: Overall cognitive status: Impaired   SENSATION: Light touch: WFL and reports sometimes having tingling on left foot  COORDINATION: Decreased LLE  MUSCLE TONE: WFL BLEs  VITALS:  137/85, HR 71  POSTURE: rounded shoulders and posterior pelvic tilt  LOWER EXTREMITY ROM:   in sitting, Active ROM WFL BLEs   LOWER EXTREMITY MMT:    MMT Right Eval Left Eval  Hip flexion 4+ 4  Hip extension    Hip abduction    Hip adduction    Hip internal rotation    Hip external rotation    Knee flexion 4 3+  Knee extension 4 3+  Ankle dorsiflexion 4 3+  Ankle plantarflexion    Ankle inversion    Ankle eversion    (Blank rows = not tested)   TRANSFERS: Assistive device utilized: None  Sit to stand: CGA Stand to sit: CGA Slowed, ataxic movements with transfers GAIT: Gait pattern: step through pattern, shuffling, ataxic, and wide BOS; veers to L Distance walked: 60 ft Assistive device utilized: Environmental consultant - 2 wheeled Level of assistance: CGA Comments: Tends to vent to L  FUNCTIONAL TESTS:  5 times sit to  stand: 25.34 sec without UE support 10 meter walk test: NT Berg Balance Scale: 22/56     PATIENT SURVEYS:  FOTO Intake score 50; predicted 69    GOALS: Goals reviewed with patient? Yes  SHORT TERM GOALS: Target date: 01/07/2023  Pt will be supervision with HEP for improved balance, strength, gait. Baseline: Goal status: IN PROGRESS  2.  Pt will improve 5x sit<>stand to less than or equal to 20 sec to demonstrate improved functional strength and transfer efficiency.  Baseline: 25.34 sec Goal status: IN PROGRESS  3.  Pt will improve Berg score to at least 30/56 to decrease fall risk. Baseline: 22/56>34/56 Goal status: MET  4.  Further vestibular testing to be completed and goal set as appropriate. Baseline:  Goal status: IN PROGRESS   LONG TERM GOALS: Target date: 02/04/2023  Pt will be supervision with HEP for improved strength, balance, gait. Baseline:  Goal status: IN PROGRESS  2.  Pt will improve 5x sit<>stand to less than or equal to 15 sec to demonstrate improved functional strength and transfer efficiency. Baseline: 25.34 sec Goal status: IN PROGRESS  3.  Pt will improve Berg score to at least 40/56 to decrease fall risk. Baseline: 22/56 Goal status: IN PROGRESS  4.  FOTO score to improve to 69 to demo improved overall functional mobility. Baseline: 50 Goal status: IN PROGRESS  5.  Further vestibular testing to be completed/goal written as appropriate.  Baseline:  Goal status: IN PROGRESS  6.  Pt will ambulate at least 1000 ft indoor and outdoor surfaces, with appropriate assistive device, mod I, for outdoor and community gait. Baseline:  Goal status: IN PROGRESS  ASSESSMENT:  CLINICAL IMPRESSION: Continued with neuro re-ed trainin to improve coordination and balance for improved gait mechanics and stability LLE in stance phase.  Activity for eye convergence via Brock string with increase in HA symptoms.  Very guarded and apprehensive with mobility and  decreased LLE single limb stance stability in loading response. Continued sessions to advance POC details OBJECTIVE IMPAIRMENTS: Abnormal gait, decreased balance, decreased knowledge of use of DME, decreased mobility, difficulty walking, decreased ROM, decreased strength, decreased safety awareness, dizziness, and postural dysfunction.   ACTIVITY LIMITATIONS: carrying, lifting, bending, standing, squatting, transfers, bathing, toileting, dressing, and locomotion level  PARTICIPATION LIMITATIONS: meal prep, cleaning, laundry, community activity, and yard work  PERSONAL FACTORS: 3+ comorbidities: see PMH; also potential visual/ vestibular component to pt's balance that needs to be further assessed  are also affecting patient's functional outcome.   REHAB POTENTIAL: Good  CLINICAL DECISION MAKING: Evolving/moderate complexity  EVALUATION COMPLEXITY: Moderate  PLAN:  PT FREQUENCY: 2x/week  PT DURATION: 8 weeks plus eval  PLANNED INTERVENTIONS: Therapeutic exercises, Therapeutic activity, Neuromuscular re-education, Balance training, Gait training, Patient/Family education, Self Care, Vestibular training, Canalith repositioning, and DME instructions  PLAN FOR NEXT SESSION: assess orthostatics? Add to HEP:  tandem stance, SLS, Romberg.  Progress balance and strength, gait with cane; continue working on convergence, VOR, habituation in functional activities   1:14 PM, 01/19/23 M. Shary Decamp, PT, DPT Physical Therapist- [place] Office Number: [phone]

## 2023-01-19 NOTE — Therapy (Signed)
OUTPATIENT OCCUPATIONAL THERAPY NEURO  Treatment Note  [name] MRN: [id number] DOB:1950/10/31, 72 y.o., male Today's Date: 01/19/2023  PCP: Antony Haste REFERRING PROVIDER: Lynden Oxford   END OF SESSION:  OT End of Session - 01/19/23 1424     Visit Number 9    Number of Visits 17    Date for OT Re-Evaluation 02/07/23    Authorization Type [employer]    Progress Note Due on Visit 10    OT Start Time 1403    OT Stop Time 1445    OT Time Calculation (min) 42 min    Behavior During Therapy WFL for tasks assessed/performed                     Past Medical History:  Diagnosis Date   Diabetes mellitus without complication (HCC)    Migraine    Stroke [facility])    Past Surgical History:  Procedure Laterality Date   BACK SURGERY     GIVENS CAPSULE STUDY N/A 05/05/2015   Procedure: GIVENS CAPSULE STUDY;  Surgeon: Charna Elizabeth, MD;  Location: [facility] ENDOSCOPY;  Service: Endoscopy;  Laterality: N/A;   LOOP RECORDER INSERTION N/A 11/29/2022   Procedure: LOOP RECORDER INSERTION;  Surgeon: Maurice Small, MD;  Location: [facility] INVASIVE CV LAB;  Service: Cardiovascular;  Laterality: N/A;   Patient Active Problem List   Diagnosis Date Noted   Essential hypertension 11/30/2022   Migraine 11/30/2022   Chronic kidney disease, stage 3a (HCC) 11/30/2022   Controlled type 2 diabetes mellitus without complication, without long-term current use of insulin (HCC) 11/30/2022   HLD (hyperlipidemia) 11/30/2022   Acute CVA (cerebrovascular accident) (HCC) 11/25/2022    ONSET DATE: 11/25/22  REFERRING DIAG: R MCA CVA  THERAPY DIAG:  Muscle weakness (generalized)  Other lack of coordination  Visuospatial deficit  Dizziness and giddiness  Unsteadiness on feet  Rationale for Evaluation and Treatment: Rehabilitation  SUBJECTIVE:   SUBJECTIVE STATEMENT: Pt reports that he "bush hogged" on Saturday when wife was out of the house.   Pt accompanied by: self  (spouse dropped off pt)  PERTINENT HISTORY: DM II, Neuropathy, CKD, Migraine  PRECAUTIONS: Fall  WEIGHT BEARING RESTRICTIONS: No  PAIN:  Are you having pain? Yes: NPRS scale: 6-6.5/10 Pain location: headache Pain description: shooting Aggravating factors: move eyes, turn head Relieving factors: rest  FALLS: Has patient fallen in last 6 months? Yes. Number of falls 1  LIVING ENVIRONMENT: Lives with: lives with their spouse Lives in: House/apartment Has following equipment at home:  Has tons of adaptive equipment from prior family members, uses shower seat, walker, has a cane, handicapped height toilet  PLOF: Independent with basic ADLs  PATIENT GOALS: feel like myself again  OBJECTIVE:   HAND DOMINANCE: Right  ADLs: Overall ADLs: supervision Transfers/ambulation related to ADLs: walks with supervision to min assist with RW Eating: Independent Grooming: supervision UB Dressing: independent LB Dressing: supervision Toileting: supervision Bathing: supervision Tub Shower transfers: close supervision Equipment: Shower seat with back and handicapped height toilet  IADLs: Shopping: NT Light housekeeping: Not participating at this time Meal Prep: Not participating at this time Community mobility: Not driving Medication management: wife Banker: wife manages Handwriting:  NT  MOBILITY STATUS: difficulty carrying objections with ambulation  POSTURE COMMENTS:  No Significant postural limitations   ACTIVITY TOLERANCE: Activity tolerance: Patient has difficulty with fatigue - reports not sleeping well at night, feels tired during the day  FUNCTIONAL OUTCOME MEASURES: FOTO: 63.75  UPPER EXTREMITY ROM:    Active ROM Right eval Left eval  Shoulder flexion [facility]  Shoulder abduction THRUOUT THRUOUT  Shoulder adduction    Shoulder extension    Shoulder internal rotation    Shoulder external rotation    Elbow flexion    Elbow extension     Wrist flexion    Wrist extension    Wrist ulnar deviation    Wrist radial deviation    Wrist pronation    Wrist supination    (Blank rows = not tested)  UPPER EXTREMITY MMT:     MMT Right eval Left eval  Shoulder flexion 4/5 4+/5  Shoulder abduction 4+/5 THRUOUT  Shoulder adduction THRUOUT   Shoulder extension    Shoulder internal rotation    Shoulder external rotation    Middle trapezius    Lower trapezius    Elbow flexion    Elbow extension    Wrist flexion    Wrist extension    Wrist ulnar deviation    Wrist radial deviation    Wrist pronation    Wrist supination    (Blank rows = not tested)  HAND FUNCTION: Grip strength: Right: 80 lbs; Left: 76 lbs and Lateral pinch: Right: 20 lbs, Left: 20 lbs  COORDINATION: Finger Nose Finger test: undershooting consistently with LUE 9 Hole Peg test: Right: 29.68 sec; Left: 36.25 sec 01/19/23: L: 34.50 sec  SENSATION: Light touch: Impaired  Stereognosis: Not tested Proprioception: WFL  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 Wife reports he was initially very confused - even hallucinating.  Improving but she reports he often is off topic in conversation  VISION: Subjective report: reports change in peripheral vision Baseline vision: Wears glasses for reading only Visual history: cataracts, retinal issues in left eye  VISION ASSESSMENT: Impaired Eye alignment: Impaired: mild Ocular ROM: impaired Tracking/Visual pursuits: Decreased smoothness with horizontal tracking and Decreased smoothness with vertical tracking Saccades: undershoots and decreased speed of saccadic movements Convergence: Impaired:   Visual Fields: Left visual field deficits Diplopia assessment: present in Left gaze needs further testing -  seems more apparent vertically.  Need to differentiate between gaze stabilization and diploia  Patient has difficulty with following activities due to following visual impairments: reading, navigating environment  PERCEPTION: Impaired: Spatial orientation: posterior bias  PRAXIS: WFL     TODAY'S TREATMENT:                                                              01/19/23 NMR: engaged in trunk rotation and reaching with LUE to incorporate crossing midline and outside BOS with focus on visual tracking, monocular vision, and motor control while in sitting. Pt placing rings on cone at lower range to address ROM, coordination, and visual attention. Pt did not knock over cone, demonstrating good motor control and ability to accommodate for double vision. Pt reports as he gets closer he is better able to identify correct object, despite persistent diplopia. 9 hole peg:L: 34.50 sec, pt demonstrating undershooting with placement of each peg. Reading:  Attempted reading 14 pt font news article.  Pt requiring bookmark  for line follow to increase success.  Pt able to read with somewhat increased effort, however only tolerating <2 mins before reporting increased headache and eye fatigue.  Discussed use of magnifying options to aid in font size on newsprint.   01/10/23 Shower: Pt reports getting in/out of shower with use of RW for additional UE support when entering/exiting.  Pt reports utilizing grab bars for stability during shower, Mod I. Functional reach: engaged in reaching with LUE to obtain items at mid-low range to facilitate weight shifting to progress towards picking up items from floor.  OT providing CGA to close supervision when reaching outside BOS.  Pt then reaching to table top to place items in peg board.  Pt reports a few instances of dizziness, benefiting from focusing on far object for stability. Vision: placing pegs into peg board to copy peg board pattern at L to challenge visual acuity  and single vision.  Pt reports double vision mostly at edges with ability to make single vision at midline.  OT increased challenge to placing straw into peg hole to further challenge single vision.  Pt frequently compensating with head turns and closing eye.      01/05/23 Card sorting: engaged in sorting cards by suit with L hand with focus on visual scanning/perception and coordination.  Pt requiring increased time/effort due to persistent double vision.  Pt able to match cards by suit with use of jumbo size cards, however reporting double vision with shapes on cards.   Resistance Clothespins: 2,4,6# with LUE for mid functional reaching and sustained pinch. OT placing clothespins to L and vertical dowel at midline to facilitate visual scanning both horizontally and vertically.  Pt reports difficulty due to double vision when attempting to place clothespins on vertical dowel and then transitioning to horizontal dowel.  Pt reporting increased difficulty as height of clothespins increased due to increase of double vision with vertical scanning. Pt frequently closing one eye or turning head to attempt to make single vision.    PATIENT EDUCATION: Education details: ongoing condition specific education Person educated: Patient Education method: Explanation Education comprehension: needs further education  HOME EXERCISE PROGRAM: Coordination handout  Access Code: [email or web address] URL: [email or web address] Date: 12/15/2022 Prepared by: [facility] - Outpatient  Rehab - [facility]  Exercises - Seated Horizontal Smooth Pursuit  - 2 x daily - 7 x weekly - 1 sets - 5 reps   GOALS: Goals reviewed with patient? No  SHORT TERM GOALS: Target date: 01/07/23  Patient will complete home exercise program designed to improve coordination in left UE Goal status: IN PROGRESS  2.  Patient will complete an HEP designed to improve visual motor skills  Goal status: IN PROGRESS  3.  Patient will  demonstrate improved awareness of visual management strategies for diplopia, and gaze stabilization.  Goal status: IN PROGRESS  4.  Patient will complete a shower with distant supervision  Goal status: MET - 01/10/23 reports completing Mod I  5.  Patient will reach to floor to obtain lightweight object to put on overhead shelf without loss of balance.    Goal status: IN PROGRESS   LONG TERM GOALS: Target date: 02/07/23  Patient will return to simple cooking - hot meal for he and his wife with intermittent min assistance and supervision  Goal status: IN PROGRESS  2.  Patient will read news article in increased font (14+) x 5 min with 100% comprehension   Goal status: IN PROGRESS  3.  Patient will demonstrate awareness  of return to driving recommendations  Goal status: IN PROGRESS  4.  Patient will report effective simple hand tool use for simple household repairs  Goal status: IN PROGRESS  5.  Patient will demonstrate at least 3 second improvement in 9 hole peg test LUE  Goal status: IN PROGRESS  6.  Patient will safely return to simple outside maintenance - gardening, mowing with supervision  Goal status: IN PROGRESS  ASSESSMENT:  CLINICAL IMPRESSION: Pt has demonstrated limited progress towards goals due to inconsistent attendance of therapy appointments.  Pt demonstrating increased bouts of dizziness impacting ability to progress dynamic standing tasks.  Focus has been placed heavily on vision and visual compensations. Pt report going to see Dr. Dione Booze who tried out some prisms but wants to look further at some imaging before finalizing anything.  Pt demonstrating good ability to utilize compensatory strategies and accommodate for diplopia during tasks this session.  PERFORMANCE DEFICITS: in functional skills including ADLs, IADLs, coordination, sensation, strength, pain, Fine motor control, Gross motor control, mobility, balance, endurance, decreased knowledge of use of DME,  vision, and UE functional use, cognitive skills including attention, energy/drive, memory, problem solving, safety awareness, and sequencing, and psychosocial skills including habits and routines and behaviors.   IMPAIRMENTS: are limiting patient from ADLs, IADLs, and leisure.   CO-MORBIDITIES: may have co-morbidities  that affects occupational performance. Patient will benefit from skilled OT to address above impairments and improve overall function.  MODIFICATION OR ASSISTANCE TO COMPLETE EVALUATION: Min-Moderate modification of tasks or assist with assess necessary to complete an evaluation.  OT OCCUPATIONAL PROFILE AND HISTORY: Detailed assessment: Review of records and additional review of physical, cognitive, psychosocial history related to current functional performance.  CLINICAL DECISION MAKING: Moderate - several treatment options, min-mod task modification necessary  REHAB POTENTIAL: Good  EVALUATION COMPLEXITY: Moderate    PLAN:  OT FREQUENCY: 2x/week  OT DURATION: 8 weeks  PLANNED INTERVENTIONS: self care/ADL training, therapeutic exercise, therapeutic activity, neuromuscular re-education, balance training, functional mobility training, patient/family education, cognitive remediation/compensation, visual/perceptual remediation/compensation, and DME and/or AE instructions  RECOMMENDED OTHER SERVICES: Possibly Neuro-optometry  CONSULTED AND AGREED WITH PLAN OF CARE: Patient and family member/caregiver  PLAN FOR NEXT SESSION: Needs stand balance training - dynamic.  Needs env't scanning, and gaze stabilization/ eye teaming activity.  Coordination.   Rosalio Loud, OT 01/19/2023, 2:24 PM

## 2023-01-21 ENCOUNTER — Encounter: Payer: Self-pay | Admitting: Physical Therapy

## 2023-01-21 ENCOUNTER — Ambulatory Visit: Payer: Medicare Other | Admitting: Physical Therapy

## 2023-01-21 ENCOUNTER — Ambulatory Visit: Payer: Medicare Other | Admitting: Occupational Therapy

## 2023-01-21 DIAGNOSIS — R41841 Cognitive communication deficit: Secondary | ICD-10-CM | POA: Diagnosis not present

## 2023-01-21 DIAGNOSIS — R2689 Other abnormalities of gait and mobility: Secondary | ICD-10-CM

## 2023-01-21 DIAGNOSIS — R2681 Unsteadiness on feet: Secondary | ICD-10-CM

## 2023-01-21 DIAGNOSIS — R278 Other lack of coordination: Secondary | ICD-10-CM

## 2023-01-21 DIAGNOSIS — R42 Dizziness and giddiness: Secondary | ICD-10-CM

## 2023-01-21 DIAGNOSIS — M6281 Muscle weakness (generalized): Secondary | ICD-10-CM

## 2023-01-21 DIAGNOSIS — R41842 Visuospatial deficit: Secondary | ICD-10-CM

## 2023-01-21 NOTE — Therapy (Signed)
OUTPATIENT OCCUPATIONAL THERAPY NEURO  Treatment Note & Progress Note  [name] MRN: [id number] DOB:[birth year], 72 y.o., male Today's Date: 01/21/2023  PCP: Antony Haste REFERRING PROVIDER: Lynden Oxford   Occupational Therapy Progress Note  Dates of Reporting Period: 12/09/22 to 01/21/23    END OF SESSION:  OT End of Session - 01/21/23 1104     Visit Number 10    Number of Visits 17    Date for OT Re-Evaluation 02/07/23    Authorization Type [employer]    Progress Note Due on Visit 10    OT Start Time 1102    OT Stop Time 1144    OT Time Calculation (min) 42 min    Behavior During Therapy WFL for tasks assessed/performed                      Past Medical History:  Diagnosis Date   Diabetes mellitus without complication (HCC)    Migraine    Stroke [facility])    Past Surgical History:  Procedure Laterality Date   BACK SURGERY     GIVENS CAPSULE STUDY N/A 05/05/2015   Procedure: GIVENS CAPSULE STUDY;  Surgeon: Charna Elizabeth, MD;  Location: [facility] ENDOSCOPY;  Service: Endoscopy;  Laterality: N/A;   LOOP RECORDER INSERTION N/A 11/29/2022   Procedure: LOOP RECORDER INSERTION;  Surgeon: Maurice Small, MD;  Location: [facility] INVASIVE CV LAB;  Service: Cardiovascular;  Laterality: N/A;   Patient Active Problem List   Diagnosis Date Noted   Essential hypertension 11/30/2022   Migraine 11/30/2022   Chronic kidney disease, stage 3a (HCC) 11/30/2022   Controlled type 2 diabetes mellitus without complication, without long-term current use of insulin (HCC) 11/30/2022   HLD (hyperlipidemia) 11/30/2022   Acute CVA (cerebrovascular accident) (HCC) 11/25/2022    ONSET DATE: 11/25/22  REFERRING DIAG: R MCA CVA  THERAPY DIAG:  Muscle weakness (generalized)  Unsteadiness on feet  Other lack of coordination  Visuospatial deficit  Rationale for Evaluation and Treatment: Rehabilitation  SUBJECTIVE:   SUBJECTIVE STATEMENT: Pt reports slight  headache, "but that's due to the dizziness." Pt accompanied by: self (spouse dropped off pt)  PERTINENT HISTORY: DM II, Neuropathy, CKD, Migraine  PRECAUTIONS: Fall  WEIGHT BEARING RESTRICTIONS: No  PAIN:  Are you having pain? Yes: NPRS scale: 4/10 Pain location: headache Pain description: shooting Aggravating factors: move eyes, turn head Relieving factors: rest  FALLS: Has patient fallen in last 6 months? Yes. Number of falls 1  LIVING ENVIRONMENT: Lives with: lives with their spouse Lives in: House/apartment Has following equipment at home:  Has tons of adaptive equipment from prior family members, uses shower seat, walker, has a cane, handicapped height toilet  PLOF: Independent with basic ADLs  PATIENT GOALS: feel like myself again  OBJECTIVE:   HAND DOMINANCE: Right  ADLs: Overall ADLs: supervision Transfers/ambulation related to ADLs: walks with supervision to min assist with RW Eating: Independent Grooming: supervision UB Dressing: independent LB Dressing: supervision Toileting: supervision Bathing: supervision Tub Shower transfers: close supervision Equipment: Shower seat with back and handicapped height toilet  IADLs: Shopping: NT Light housekeeping: Not participating at this time Meal Prep: Not participating at this time Community mobility: Not driving Medication management: wife Banker: wife manages Handwriting:  NT  MOBILITY STATUS: difficulty carrying objections with ambulation  POSTURE COMMENTS:  No Significant postural limitations   ACTIVITY TOLERANCE: Activity tolerance: Patient has difficulty with fatigue - reports not sleeping well at night, feels tired during  the day  FUNCTIONAL OUTCOME MEASURES: FOTO: 63.75 01/21/23: 63  UPPER EXTREMITY ROM:    Active ROM Right eval Left eval  Shoulder flexion [facility]  Shoulder abduction THRUOUT THRUOUT  Shoulder adduction    Shoulder extension    Shoulder internal  rotation    Shoulder external rotation    Elbow flexion    Elbow extension    Wrist flexion    Wrist extension    Wrist ulnar deviation    Wrist radial deviation    Wrist pronation    Wrist supination    (Blank rows = not tested)  UPPER EXTREMITY MMT:     MMT Right eval Left eval  Shoulder flexion 4/5 4+/5  Shoulder abduction 4+/5 THRUOUT  Shoulder adduction THRUOUT   Shoulder extension    Shoulder internal rotation    Shoulder external rotation    Middle trapezius    Lower trapezius    Elbow flexion    Elbow extension    Wrist flexion    Wrist extension    Wrist ulnar deviation    Wrist radial deviation    Wrist pronation    Wrist supination    (Blank rows = not tested)  HAND FUNCTION: Grip strength: Right: 80 lbs; Left: 76 lbs and Lateral pinch: Right: 20 lbs, Left: 20 lbs  COORDINATION: Finger Nose Finger test: undershooting consistently with LUE 9 Hole Peg test: Right: 29.68 sec; Left: 36.25 sec 01/19/23: L: 34.50 sec  SENSATION: Light touch: Impaired  Stereognosis: Not tested Proprioception: WFL  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 Wife reports he was initially very confused - even hallucinating.  Improving but she reports he often is off topic in conversation  VISION: Subjective report: reports change in peripheral vision Baseline vision: Wears glasses for reading only Visual history: cataracts, retinal issues in left eye  VISION ASSESSMENT: Impaired Eye alignment: Impaired: mild Ocular ROM: impaired Tracking/Visual pursuits: Decreased smoothness with horizontal tracking and Decreased smoothness with vertical tracking Saccades: undershoots and decreased speed of saccadic movements Convergence: Impaired:   Visual Fields: Left visual field  deficits Diplopia assessment: present in Left gaze needs further testing - seems more apparent vertically.  Need to differentiate between gaze stabilization and diploia  Patient has difficulty with following activities due to following visual impairments: reading, navigating environment  PERCEPTION: Impaired: Spatial orientation: posterior bias  PRAXIS: WFL     TODAY'S TREATMENT:                                                              01/21/23 Hand Gripper: with LUE on level 70# with silver spring. Pt picked up 1 inch blocks with gripper with 2 drops and min difficulty. Completed with RUE on level 70# with silver spring. Pt picked up 1 inch blocks with gripper with 0 drops and no difficulty.  Pt reports noting increased dizziness/double vision when scanning vertically to place blocks in container. Dynamic standing: engaged in retrieving items from low stool in standing with LUE to challenge weight shifting, balance, and visual attention to progress towards retrieving items from floor. Close supervision provided  due to reports of dizziness with head moving and eye shifting.  Unable to progress to pickings items from floor due to increased dizziness this session. UE ROM: engaged in ulnar nerve gliding with LUE and RUE x10 with focus on quality of movement while discussing onset of numbness with and without movement. Visual scanning: completed Trail A in 1:14 and Trail B in 2:31 due to diplopia and difficulty with sustained vision for increased time as well as difficulty with alternating attention between sequencing numbers and letters.    01/19/23 NMR: engaged in trunk rotation and reaching with LUE to incorporate crossing midline and outside BOS with focus on visual tracking, monocular vision, and motor control while in sitting. Pt placing rings on cone at lower range to address ROM, coordination, and visual attention. Pt did not knock over cone, demonstrating good motor control and ability to  accommodate for double vision. Pt reports as he gets closer he is better able to identify correct object, despite persistent diplopia. 9 hole peg:L: 34.50 sec, pt demonstrating undershooting with placement of each peg. Reading:  Attempted reading 14 pt font news article.  Pt requiring bookmark for line follow to increase success.  Pt able to read with somewhat increased effort, however only tolerating <2 mins before reporting increased headache and eye fatigue.  Discussed use of magnifying options to aid in font size on newsprint.   01/10/23 Shower: Pt reports getting in/out of shower with use of RW for additional UE support when entering/exiting.  Pt reports utilizing grab bars for stability during shower, Mod I. Functional reach: engaged in reaching with LUE to obtain items at mid-low range to facilitate weight shifting to progress towards picking up items from floor.  OT providing CGA to close supervision when reaching outside BOS.  Pt then reaching to table top to place items in peg board.  Pt reports a few instances of dizziness, benefiting from focusing on far object for stability. Vision: placing pegs into peg board to copy peg board pattern at L to challenge visual acuity and single vision.  Pt reports double vision mostly at edges with ability to make single vision at midline.  OT increased challenge to placing straw into peg hole to further challenge single vision.  Pt frequently compensating with head turns and closing eye.      PATIENT EDUCATION: Education details: ongoing condition specific education Person educated: Patient Education method: Explanation Education comprehension: needs further education  HOME EXERCISE PROGRAM: Coordination handout  Access Code: [email or web address] URL: [email or web address] Date: 12/15/2022 Prepared by: [facility] - Outpatient  Rehab - [facility]  Exercises - Seated Horizontal Smooth Pursuit  - 2 x daily - 7 x weekly - 1 sets - 5  reps   GOALS: Goals reviewed with patient? No  SHORT TERM GOALS: Target date: 01/07/23  Patient will complete home exercise program designed to improve coordination in left UE Goal status: MET  2.  Patient will complete an HEP designed to improve visual motor skills  Goal status: MET  3.  Patient will demonstrate improved awareness of visual management strategies for diplopia, and gaze stabilization.  Goal status: IN PROGRESS  4.  Patient will complete a shower with distant supervision  Goal status: MET - 01/10/23 reports completing Mod I  5.  Patient will reach to floor to obtain lightweight object to put on overhead shelf without loss of balance.    Goal status: IN PROGRESS   LONG TERM GOALS: Target date: 02/07/23  Patient will return  to simple cooking - hot meal for he and his wife with intermittent min assistance and supervision  Goal status: IN PROGRESS  2.  Patient will read news article in increased font (14+) x 5 min with 100% comprehension   Goal status: IN PROGRESS  3.  Patient will demonstrate awareness of return to driving recommendations  Goal status: IN PROGRESS  4.  Patient will report effective simple hand tool use for simple household repairs  Goal status: IN PROGRESS  5.  Patient will demonstrate at least 3 second improvement in 9 hole peg test LUE  Goal status: IN PROGRESS  6.  Patient will safely return to simple outside maintenance - gardening, mowing with supervision  Goal status: IN PROGRESS  ASSESSMENT:  CLINICAL IMPRESSION: Pt has demonstrated limited progress towards goals due to missing a couple of therapy appointments due to dizziness and/or no show.  Pt demonstrating increased bouts of dizziness impacting ability to progress dynamic standing tasks and retrieving items from floor.  Focus has been placed heavily on vision and visual compensations. Pt report going to see Dr. Dione Booze who tried out some prisms but wants to look further at some  imaging before finalizing anything.  Pt demonstrating good ability to utilize compensatory strategies and accommodate for diplopia during tasks at table top level, however increased dizziness with increased challenge of standing.  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  Pt will benefit from neuro-ophthalmology intervention and may benefit from further work up as pt with persistent dizziness and diplopia that are not of orthostatic nature.  OT to continue to address safety and functional mobility to facilitate safe return to leisure pursuits, coordination, and address vision as able.  PERFORMANCE DEFICITS: in functional skills including ADLs, IADLs, coordination, sensation, strength, pain, Fine motor control, Gross motor control, mobility, balance, endurance, decreased knowledge of use of DME, vision, and UE functional use, cognitive skills including attention, energy/drive, memory, problem solving, safety awareness, and sequencing, and psychosocial skills including habits and routines and behaviors.   IMPAIRMENTS: are limiting patient from ADLs, IADLs, and leisure.   CO-MORBIDITIES: may have co-morbidities  that affects occupational performance. Patient will benefit from skilled OT to address above impairments and improve overall function.  MODIFICATION OR ASSISTANCE TO COMPLETE EVALUATION: Min-Moderate modification of tasks or assist with assess necessary to complete an evaluation.  OT OCCUPATIONAL PROFILE AND HISTORY: Detailed assessment: Review of records and additional review of physical, cognitive, psychosocial history related to current functional performance.  CLINICAL DECISION MAKING: Moderate - several treatment options, min-mod task modification necessary  REHAB POTENTIAL: Good  EVALUATION COMPLEXITY: Moderate    PLAN:  OT FREQUENCY:  2x/week  OT DURATION: 8 weeks  PLANNED INTERVENTIONS: self care/ADL training, therapeutic exercise, therapeutic activity, neuromuscular re-education, balance training, functional mobility training, patient/family education, cognitive remediation/compensation, visual/perceptual remediation/compensation, and DME and/or AE instructions  RECOMMENDED OTHER SERVICES: Possibly Neuro-optometry  CONSULTED AND AGREED WITH PLAN OF CARE: Patient and family member/caregiver  PLAN FOR NEXT SESSION: Needs stand balance training - dynamic.  Needs env't scanning, and gaze stabilization/ eye teaming activity.  Coordination.   Rosalio Loud, OT 01/21/2023, 11:06 AM

## 2023-01-21 NOTE — Therapy (Signed)
OUTPATIENT PHYSICAL THERAPY NEURO TREATMENT/PROGRESS NOTE   [name] MRN: [id number] DOB:28-Dec-1950, 72 y.o., male Today's Date: 01/21/2023   PCP: [name], MD REFERRING PROVIDER: [name], MD >to send to PCP, [name]  Progress Note Reporting Period 12/09/2022 to 01/21/2023  See note below for Objective Data and Assessment of Progress/Goals.      END OF SESSION:  PT End of Session - 01/21/23 1107     Visit Number 10    Number of Visits 17    Date for PT Re-Evaluation 02/04/23    Authorization Type [employer]    PT Start Time 1018    PT Stop Time 1100    PT Time Calculation (min) 42 min    Equipment Utilized During Treatment Gait belt    Activity Tolerance Patient tolerated treatment well    Behavior During Therapy WFL for tasks assessed/performed                Past Medical History:  Diagnosis Date   Diabetes mellitus without complication (HCC)    Migraine    Stroke [facility])    Past Surgical History:  Procedure Laterality Date   BACK SURGERY     GIVENS CAPSULE STUDY N/A 05/05/2015   Procedure: GIVENS CAPSULE STUDY;  Surgeon: Charna Elizabeth, MD;  Location: [facility] ENDOSCOPY;  Service: Endoscopy;  Laterality: N/A;   LOOP RECORDER INSERTION N/A 11/29/2022   Procedure: LOOP RECORDER INSERTION;  Surgeon: Maurice Small, MD;  Location: [facility] INVASIVE CV LAB;  Service: Cardiovascular;  Laterality: N/A;   Patient Active Problem List   Diagnosis Date Noted   Essential hypertension 11/30/2022   Migraine 11/30/2022   Chronic kidney disease, stage 3a (HCC) 11/30/2022   Controlled type 2 diabetes mellitus without complication, without long-term current use of insulin (HCC) 11/30/2022   HLD (hyperlipidemia) 11/30/2022   Acute CVA (cerebrovascular accident) (HCC) 11/25/2022    ONSET DATE: 11/25/2022  REFERRING DIAG: I63.9 (ICD-10-CM) - Cerebrovascular accident (CVA), unspecified mechanism   THERAPY DIAG:  Unsteadiness on feet  Muscle  weakness (generalized)  Other abnormalities of gait and mobility  Dizziness and giddiness  Rationale for Evaluation and Treatment: Rehabilitation  SUBJECTIVE:                                                                                                                                                                                             SUBJECTIVE STATEMENT: Feel a little stronger.  I stumble a lot, especially when I'm outside.  Reports he did a lot outside over the weekend. Pt accompanied by: self  PERTINENT HISTORY: a-fib, DM, hx of  migraines, hx of back surgery, multiple knee surgeries (per pt report)  PAIN:  Are you having pain? Yes: NPRS scale: 4/10 Pain location: headache Pain description: dull headache Aggravating factors: sunlight, noise Relieving factors: darkness, quietness  PRECAUTIONS: Fall and Other: loop recorder; requires supervision at all times  WEIGHT BEARING RESTRICTIONS: No  FALLS: Has patient fallen in last 6 months? Yes. Number of falls 1 with his initial CVA  LIVING ENVIRONMENT: Lives with: lives with their spouse Lives in: House/apartment Stairs: Yes: External: 3 steps; on right going up.  Home has basement, but he doesn't go down into basement. Has following equipment at home: Single point cane, Walker - 2 wheeled, and bed side commode  PLOF: Independent.  [place], mowing.  Enjoys seeing grandson play baseball.  PATIENT GOALS: To get back to normal  OBJECTIVE:   TODAY'S TREATMENT: 01/21/2023 Activity Comments  FTSTS:  37.47 arms at chest, 2nd trial 28.35 sec   TUG:  36.25 sec with TUG   Gait velocity:22.62 sec = 1.45 ft/sec with SPC   Orthostatic BP measures: Supine after 5 minutes:  131/85 HR 69 Standing after 1 minute:  133/87 HR 80  Standing after 3 minutes:  134/88, HR 81   Reports mild lightheadedness upon initial standing, resolves < 1 minute  Standing feet apart, feet together EO and EC, 30 sec on  solid surface   Balance strategy work: -heel/toe raises x 10 -hip strategy with a/p hip bumps to counter x 10 -forward/back step and weightshift UE support and cues for movement technique, improves with repetition     PATIENT EDUCATION: Education details: progress towards goals-improved balance, still at fall risk; use cane for short distances, RW still for long distances. Person educated: Patient Education method: Explanation and Demonstration Education comprehension: verbalized understanding and returned demonstration    HOME EXERCISE PROGRAM Last updated: 12/17/22 Access Code: [email or web address] URL: [email or web address] Date: 12/17/2022 Prepared by: [facility] - Outpatient  Rehab - [facility]  Exercises - Seated March  - 1 x daily - 5 x weekly - 3 sets - 10 reps - Seated Long Arc Quad  - 1 x daily - 5 x weekly - 3 sets - 10 reps - Pencil Pushups  - 2 x daily - 7 x weekly - 1 sets - 5 reps - Sit to Stand with Counter Support  - 1 x daily - 5 x weekly - 2 sets - 10 reps - Seated Left Head Turns Vestibular Habituation  - 1 x daily - 5 x weekly - 2-3 sets - 30 sec hold - Seated Head Nod  - 1 x daily - 5 x weekly - 2-3 sets - 30 sec hold      Below measures were taken at time of initial evaluation unless otherwise specified:    VESTIBULAR ASSESSMENT   GENERAL OBSERVATION: No acute distress    SYMPTOM BEHAVIOR:   Subjective history: Recent hx of CVA and since then he feels like he is being pulled backward or to the side.  Wife (at eval) notes veering with gait.   Non-Vestibular symptoms: diplopia, migraine symptoms, and CVA 11/25/22   Type of dizziness: Imbalance (Disequilibrium) and Unsteady with head/body turns.  Feels like he is going backwards   Frequency: throughout the day   Duration: with movement   Aggravating factors: Induced by position change: sit to stand and Induced by motion: occur when walking   Relieving factors: slow movements   Progression of  symptoms: unchanged   OCULOMOTOR EXAM:   Ocular Alignment: abnormal and L eye slightly lower   Ocular ROM: No Limitations and double vision with upper motion   Spontaneous Nystagmus: absent   Gaze-Induced Nystagmus: absent   Smooth Pursuits: intact   Saccades: slow, diplopia in upward vertical direction   Convergence/Divergence: 30 inches, then 19 inches (notes blurry vision farther away)    VESTIBULAR - OCULAR REFLEX:    Slow VOR: Comment: double vision horizontal and vertical, rates dizziness as 4/10   VOR Cancellation: Comment: reports dizziness 8/10   Head-Impulse Test: NT          -------------------------------------------------------------- Objective measures below taken at the initial evaluation:  DIAGNOSTIC FINDINGS: 1. 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 2. Underlying mild age-related cerebral atrophy with moderate chronic microvascular ischemic disease.  COGNITION: Overall cognitive status: Impaired   SENSATION: Light touch: WFL and reports sometimes having tingling on left foot  COORDINATION: Decreased LLE  MUSCLE TONE: WFL BLEs  VITALS:  137/85, HR 71  POSTURE: rounded shoulders and posterior pelvic tilt  LOWER EXTREMITY ROM:   in sitting, Active ROM WFL BLEs   LOWER EXTREMITY MMT:    MMT Right Eval Left Eval  Hip flexion 4+ 4  Hip extension    Hip abduction    Hip adduction    Hip internal rotation    Hip external rotation    Knee flexion 4 3+  Knee extension 4 3+  Ankle dorsiflexion 4 3+  Ankle plantarflexion    Ankle inversion    Ankle eversion    (Blank rows = not tested)   TRANSFERS: Assistive device utilized: None  Sit to stand: CGA Stand to sit: CGA Slowed, ataxic movements with transfers GAIT: Gait pattern: step through pattern, shuffling, ataxic, and wide BOS; veers to L Distance walked: 60 ft Assistive device utilized: Environmental consultant - 2 wheeled Level of assistance: CGA Comments: Tends to  vent to L  FUNCTIONAL TESTS:  5 times sit to stand: 25.34 sec without UE support 10 meter walk test: NT Berg Balance Scale: 22/56     PATIENT SURVEYS:  FOTO Intake score 50; predicted 69    GOALS: Goals reviewed with patient? Yes  SHORT TERM GOALS: Target date: 01/07/2023  Pt will be supervision with HEP for improved balance, strength, gait. Baseline: Goal status: IN PROGRESS  2.  Pt will improve 5x sit<>stand to less than or equal to 20 sec to demonstrate improved functional strength and transfer efficiency.  Baseline: 25.34 sec Goal status: IN PROGRESS  3.  Pt will improve Berg score to at least 30/56 to decrease fall risk. Baseline: 22/56>34/56 Goal status: MET  4.  Further vestibular testing to be completed and goal set as appropriate. Baseline:  Goal status: IN PROGRESS   LONG TERM GOALS: Target date: 02/04/2023  Pt will be supervision with HEP for improved strength, balance, gait. Baseline:  Goal status: IN PROGRESS  2.  Pt will improve 5x sit<>stand to less than or equal to 15 sec to demonstrate improved functional strength and transfer efficiency. Baseline: 25.34 sec Goal status: IN PROGRESS  3.  Pt will improve Berg score to at least 40/56 to decrease fall risk. Baseline: 22/56 Goal status: IN PROGRESS  4.  FOTO score to improve to 69 to demo improved overall functional mobility. Baseline: 50 Goal status: IN PROGRESS  5.  Further vestibular testing to be completed/goal written as appropriate. Baseline:  Goal status: IN PROGRESS  6.  Pt will ambulate at least 1000 ft indoor and outdoor surfaces, with appropriate assistive device, mod I, for outdoor and community gait. Baseline:  Goal status: IN PROGRESS  ASSESSMENT:  CLINICAL IMPRESSION: 10th Visit PN:  Pt reports he is moving better with some better balance.  Double vision is getting some better.  Objective measures today:  FTSTS:  27 sec with arms crossed at chest (25.34 sec at eval), TUG 35  sec, gt velocity 1.45 ft/sec.  Previously measured Berg 34/56, improved from 22/56.  Pt is progressing slowly and steadily towards goals; he does continue to be at a fall risk per FTSTS, TUG, Berg and gait velocity scores.  Orthostatic hypotension testing today with no changes in BP measures, only brief and minor c/o lightheadedness upon coming up to stand.  He continues to benefit from skilled PT towards LTGs for improved strength, balance, gait for return to independence and decreased fall risk. OBJECTIVE IMPAIRMENTS: Abnormal gait, decreased balance, decreased knowledge of use of DME, decreased mobility, difficulty walking, decreased ROM, decreased strength, decreased safety awareness, dizziness, and postural dysfunction.   ACTIVITY LIMITATIONS: carrying, lifting, bending, standing, squatting, transfers, bathing, toileting, dressing, and locomotion level  PARTICIPATION LIMITATIONS: meal prep, cleaning, laundry, community activity, and yard work  PERSONAL FACTORS: 3+ comorbidities: see PMH; also potential visual/ vestibular component to pt's balance that needs to be further assessed  are also affecting patient's functional outcome.   REHAB POTENTIAL: Good  CLINICAL DECISION MAKING: Evolving/moderate complexity  EVALUATION COMPLEXITY: Moderate  PLAN:  PT FREQUENCY: 2x/week  PT DURATION: 8 weeks plus eval  PLANNED INTERVENTIONS: Therapeutic exercises, Therapeutic activity, Neuromuscular re-education, Balance training, Gait training, Patient/Family education, Self Care, Vestibular training, Canalith repositioning, and DME instructions  PLAN FOR NEXT SESSION: Add to HEP:  tandem stance, SLS, Romberg.  Progress balance and strength, compliant surfaces; continue working on convergence, VOR, habituation in functional activities   Lonia Blood, PT 01/21/23 12:18 PM Phone: [phone] Fax: [phone]   [facility] at [facility] [address], [address] [place], [place] [address] Phone # [phone] Fax # [phone]

## 2023-01-25 ENCOUNTER — Encounter: Payer: Self-pay | Admitting: Physical Therapy

## 2023-01-25 ENCOUNTER — Ambulatory Visit: Payer: Medicare Other | Admitting: Physical Therapy

## 2023-01-25 ENCOUNTER — Ambulatory Visit: Payer: Medicare Other | Admitting: Occupational Therapy

## 2023-01-25 ENCOUNTER — Ambulatory Visit: Payer: Medicare Other

## 2023-01-25 DIAGNOSIS — R2689 Other abnormalities of gait and mobility: Secondary | ICD-10-CM

## 2023-01-25 DIAGNOSIS — R41842 Visuospatial deficit: Secondary | ICD-10-CM

## 2023-01-25 DIAGNOSIS — R278 Other lack of coordination: Secondary | ICD-10-CM

## 2023-01-25 DIAGNOSIS — R42 Dizziness and giddiness: Secondary | ICD-10-CM

## 2023-01-25 DIAGNOSIS — R2681 Unsteadiness on feet: Secondary | ICD-10-CM

## 2023-01-25 DIAGNOSIS — R41841 Cognitive communication deficit: Secondary | ICD-10-CM

## 2023-01-25 DIAGNOSIS — R471 Dysarthria and anarthria: Secondary | ICD-10-CM

## 2023-01-25 DIAGNOSIS — M6281 Muscle weakness (generalized): Secondary | ICD-10-CM

## 2023-01-25 NOTE — Therapy (Signed)
OUTPATIENT OCCUPATIONAL THERAPY NEURO  Treatment Note  [name] MRN: [id number] DOB:10-04-1950, 72 y.o., male Today's Date: 01/25/2023  PCP: Antony Haste REFERRING PROVIDER: Lynden Oxford      END OF SESSION:  OT End of Session - 01/25/23 1238     Visit Number 11    Number of Visits 17    Date for OT Re-Evaluation 02/07/23    Authorization Type [employer]    Progress Note Due on Visit 10    OT Start Time 1234    OT Stop Time 1315    OT Time Calculation (min) 41 min    Behavior During Therapy WFL for tasks assessed/performed                       Past Medical History:  Diagnosis Date   Diabetes mellitus without complication (HCC)    Migraine    Stroke [facility])    Past Surgical History:  Procedure Laterality Date   BACK SURGERY     GIVENS CAPSULE STUDY N/A 05/05/2015   Procedure: GIVENS CAPSULE STUDY;  Surgeon: Charna Elizabeth, MD;  Location: [facility] ENDOSCOPY;  Service: Endoscopy;  Laterality: N/A;   LOOP RECORDER INSERTION N/A 11/29/2022   Procedure: LOOP RECORDER INSERTION;  Surgeon: Maurice Small, MD;  Location: [facility] INVASIVE CV LAB;  Service: Cardiovascular;  Laterality: N/A;   Patient Active Problem List   Diagnosis Date Noted   Essential hypertension 11/30/2022   Migraine 11/30/2022   Chronic kidney disease, stage 3a (HCC) 11/30/2022   Controlled type 2 diabetes mellitus without complication, without long-term current use of insulin (HCC) 11/30/2022   HLD (hyperlipidemia) 11/30/2022   Acute CVA (cerebrovascular accident) (HCC) 11/25/2022    ONSET DATE: 11/25/22  REFERRING DIAG: R MCA CVA  THERAPY DIAG:  Muscle weakness (generalized)  Other lack of coordination  Unsteadiness on feet  Visuospatial deficit  Dizziness and giddiness  Rationale for Evaluation and Treatment: Rehabilitation  SUBJECTIVE:   SUBJECTIVE STATEMENT: Pt reports feeling more "blah" today.  Pt states that when it rains it decreases his anxiety and  he doesn't focus on what he should be doing and can't. Pt accompanied by: self (spouse dropped off pt)  PERTINENT HISTORY: DM II, Neuropathy, CKD, Migraine  PRECAUTIONS: Fall  WEIGHT BEARING RESTRICTIONS: No  PAIN:  Are you having pain? Yes: NPRS scale: 4/10 Pain location: headache Pain description: shooting Aggravating factors: move eyes, turn head; riding in the car Relieving factors: rest  FALLS: Has patient fallen in last 6 months? Yes. Number of falls 1  LIVING ENVIRONMENT: Lives with: lives with their spouse Lives in: House/apartment Has following equipment at home:  Has tons of adaptive equipment from prior family members, uses shower seat, walker, has a cane, handicapped height toilet  PLOF: Independent with basic ADLs  PATIENT GOALS: feel like myself again  OBJECTIVE:   HAND DOMINANCE: Right  ADLs: Overall ADLs: supervision Transfers/ambulation related to ADLs: walks with supervision to min assist with RW Eating: Independent Grooming: supervision UB Dressing: independent LB Dressing: supervision Toileting: supervision Bathing: supervision Tub Shower transfers: close supervision Equipment: Shower seat with back and handicapped height toilet  IADLs: Shopping: NT Light housekeeping: Not participating at this time Meal Prep: Not participating at this time Community mobility: Not driving Medication management: wife Banker: wife manages Handwriting:  NT  MOBILITY STATUS: difficulty carrying objections with ambulation  POSTURE COMMENTS:  No Significant postural limitations   ACTIVITY TOLERANCE: Activity tolerance: Patient has  difficulty with fatigue - reports not sleeping well at night, feels tired during the day  FUNCTIONAL OUTCOME MEASURES: FOTO: 63.75 01/21/23: 63  UPPER EXTREMITY ROM:    Active ROM Right eval Left eval  Shoulder flexion [facility]  Shoulder abduction THRUOUT THRUOUT  Shoulder adduction    Shoulder  extension    Shoulder internal rotation    Shoulder external rotation    Elbow flexion    Elbow extension    Wrist flexion    Wrist extension    Wrist ulnar deviation    Wrist radial deviation    Wrist pronation    Wrist supination    (Blank rows = not tested)  UPPER EXTREMITY MMT:     MMT Right eval Left eval  Shoulder flexion 4/5 4+/5  Shoulder abduction 4+/5 THRUOUT  Shoulder adduction THRUOUT   Shoulder extension    Shoulder internal rotation    Shoulder external rotation    Middle trapezius    Lower trapezius    Elbow flexion    Elbow extension    Wrist flexion    Wrist extension    Wrist ulnar deviation    Wrist radial deviation    Wrist pronation    Wrist supination    (Blank rows = not tested)  HAND FUNCTION: Grip strength: Right: 80 lbs; Left: 76 lbs and Lateral pinch: Right: 20 lbs, Left: 20 lbs  COORDINATION: Finger Nose Finger test: undershooting consistently with LUE 9 Hole Peg test: Right: 29.68 sec; Left: 36.25 sec 01/19/23: L: 34.50 sec  SENSATION: Light touch: Impaired  Stereognosis: Not tested Proprioception: WFL  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 Wife reports he was initially very confused - even hallucinating.  Improving but she reports he often is off topic in conversation  VISION: Subjective report: reports change in peripheral vision Baseline vision: Wears glasses for reading only Visual history: cataracts, retinal issues in left eye  VISION ASSESSMENT: Impaired Eye alignment: Impaired: mild Ocular ROM: impaired Tracking/Visual pursuits: Decreased smoothness with horizontal tracking and Decreased smoothness with vertical tracking Saccades: undershoots and decreased speed of saccadic movements Convergence: Impaired:    Visual Fields: Left visual field deficits Diplopia assessment: present in Left gaze needs further testing - seems more apparent vertically.  Need to differentiate between gaze stabilization and diploia  Patient has difficulty with following activities due to following visual impairments: reading, navigating environment  PERCEPTION: Impaired: Spatial orientation: posterior bias  PRAXIS: WFL     TODAY'S TREATMENT:                                                              01/25/23 Vision: attempted to utilize [place] chart to further address vision with focusing on alternating between near and far vision.  Pt able to read letters on chart ~ 3 M away with increased time to make each letter clear in monocular vision.  Pt requiring small chart to be 26 cm from eyes to make out letters.  Pt unable to alternate from near to far vision.  Improved ease of reading line when far chart brought closer to 5-6  feet away, however still with inability to alternate from near to far. Coordination: engaged in peg board pattern replication on vertical surface to challenge vertical vision with pattern above peg board.  Pt demonstrating min challenges with diagonal lines and need to turn head ~90* to ensure lines in correct place.  Pt occasionally closing one eye to achieve monocular vision.    01/21/23 Hand Gripper: with LUE on level 70# with silver spring. Pt picked up 1 inch blocks with gripper with 2 drops and min difficulty. Completed with RUE on level 70# with silver spring. Pt picked up 1 inch blocks with gripper with 0 drops and no difficulty.  Pt reports noting increased dizziness/double vision when scanning vertically to place blocks in container. Dynamic standing: engaged in retrieving items from low stool in standing with LUE to challenge weight shifting, balance, and visual attention to progress towards retrieving items from floor. Close supervision provided due to reports of dizziness with head moving and  eye shifting.  Unable to progress to pickings items from floor due to increased dizziness this session. UE ROM: engaged in ulnar nerve gliding with LUE and RUE x10 with focus on quality of movement while discussing onset of numbness with and without movement. Visual scanning: completed Trail A in 1:14 and Trail B in 2:31 due to diplopia and difficulty with sustained vision for increased time as well as difficulty with alternating attention between sequencing numbers and letters.    01/19/23 NMR: engaged in trunk rotation and reaching with LUE to incorporate crossing midline and outside BOS with focus on visual tracking, monocular vision, and motor control while in sitting. Pt placing rings on cone at lower range to address ROM, coordination, and visual attention. Pt did not knock over cone, demonstrating good motor control and ability to accommodate for double vision. Pt reports as he gets closer he is better able to identify correct object, despite persistent diplopia. 9 hole peg:L: 34.50 sec, pt demonstrating undershooting with placement of each peg. Reading:  Attempted reading 14 pt font news article.  Pt requiring bookmark for line follow to increase success.  Pt able to read with somewhat increased effort, however only tolerating <2 mins before reporting increased headache and eye fatigue.  Discussed use of magnifying options to aid in font size on newsprint.   PATIENT EDUCATION: Education details: ongoing condition specific education Person educated: Patient Education method: Explanation Education comprehension: needs further education  HOME EXERCISE PROGRAM: Coordination handout  Access Code: [email or web address] URL: [email or web address] Date: 12/15/2022 Prepared by: [facility] - Outpatient  Rehab - [facility]  Exercises - Seated Horizontal Smooth Pursuit  - 2 x daily - 7 x weekly - 1 sets - 5 reps   GOALS: Goals reviewed with patient? No  SHORT TERM GOALS: Target date:  01/07/23  Patient will complete home exercise program designed to improve coordination in left UE Goal status: MET  2.  Patient will complete an HEP designed to improve visual motor skills  Goal status: MET  3.  Patient will demonstrate improved awareness of visual management strategies for diplopia, and gaze stabilization.  Goal status: IN PROGRESS  4.  Patient will complete a shower with distant supervision  Goal status: MET - 01/10/23 reports completing Mod I  5.  Patient will reach to floor to obtain lightweight object to put on overhead shelf without loss of balance.    Goal status: IN PROGRESS   LONG TERM GOALS: Target date: 02/07/23  Patient will return to simple cooking -  hot meal for he and his wife with intermittent min assistance and supervision  Goal status: IN PROGRESS  2.  Patient will read news article in increased font (14+) x 5 min with 100% comprehension   Goal status: IN PROGRESS  3.  Patient will demonstrate awareness of return to driving recommendations  Goal status: IN PROGRESS  4.  Patient will report effective simple hand tool use for simple household repairs  Goal status: IN PROGRESS  5.  Patient will demonstrate at least 3 second improvement in 9 hole peg test LUE  Goal status: IN PROGRESS  6.  Patient will safely return to simple outside maintenance - gardening, mowing with supervision  Goal status: IN PROGRESS  ASSESSMENT:  CLINICAL IMPRESSION: Pt required increased time to acclimate to standing each during each standing attempt and close supervision during ambulation from lobby to treatment space.  Pt demonstrating good ability to utilize compensatory strategies and accommodate for diplopia during tasks at table top level and in standing this session.  Pt voicing frustration due to ongoing diplopia and dizziness, attributing vision to balance and dizziness issues.   PERFORMANCE DEFICITS: in functional skills including ADLs, IADLs,  coordination, sensation, strength, pain, Fine motor control, Gross motor control, mobility, balance, endurance, decreased knowledge of use of DME, vision, and UE functional use, cognitive skills including attention, energy/drive, memory, problem solving, safety awareness, and sequencing, and psychosocial skills including habits and routines and behaviors.   IMPAIRMENTS: are limiting patient from ADLs, IADLs, and leisure.   CO-MORBIDITIES: may have co-morbidities  that affects occupational performance. Patient will benefit from skilled OT to address above impairments and improve overall function.  MODIFICATION OR ASSISTANCE TO COMPLETE EVALUATION: Min-Moderate modification of tasks or assist with assess necessary to complete an evaluation.  OT OCCUPATIONAL PROFILE AND HISTORY: Detailed assessment: Review of records and additional review of physical, cognitive, psychosocial history related to current functional performance.  CLINICAL DECISION MAKING: Moderate - several treatment options, min-mod task modification necessary  REHAB POTENTIAL: Good  EVALUATION COMPLEXITY: Moderate    PLAN:  OT FREQUENCY: 2x/week  OT DURATION: 8 weeks  PLANNED INTERVENTIONS: self care/ADL training, therapeutic exercise, therapeutic activity, neuromuscular re-education, balance training, functional mobility training, patient/family education, cognitive remediation/compensation, visual/perceptual remediation/compensation, and DME and/or AE instructions  RECOMMENDED OTHER SERVICES: Possibly Neuro-optometry  CONSULTED AND AGREED WITH PLAN OF CARE: Patient and family member/caregiver  PLAN FOR NEXT SESSION: Needs stand balance training - dynamic.  Needs env't scanning, and gaze stabilization/ eye teaming activity.  Coordination.   Rosalio Loud, OT 01/25/2023, 12:38 PM

## 2023-01-25 NOTE — Therapy (Signed)
OUTPATIENT PHYSICAL THERAPY NEURO TREATMENT   [name] MRN: [id number] DOB:November 09, 1950, 72 y.o., male Today's Date: 01/25/2023   PCP: [name], MD REFERRING PROVIDER: [name], MD >to send to PCP, [name]     END OF SESSION:  PT End of Session - 01/25/23 1249     Visit Number 11    Number of Visits 17    Date for PT Re-Evaluation 02/04/23    Authorization Type [employer] Medicare    PT Start Time 1320    PT Stop Time 1401    PT Time Calculation (min) 41 min    Equipment Utilized During Treatment Gait belt    Activity Tolerance Patient tolerated treatment well    Behavior During Therapy WFL for tasks assessed/performed                Past Medical History:  Diagnosis Date   Diabetes mellitus without complication (HCC)    Migraine    Stroke [facility])    Past Surgical History:  Procedure Laterality Date   BACK SURGERY     GIVENS CAPSULE STUDY N/A 05/05/2015   Procedure: GIVENS CAPSULE STUDY;  Surgeon: Charna Elizabeth, MD;  Location: [facility] ENDOSCOPY;  Service: Endoscopy;  Laterality: N/A;   LOOP RECORDER INSERTION N/A 11/29/2022   Procedure: LOOP RECORDER INSERTION;  Surgeon: Maurice Small, MD;  Location: [facility] INVASIVE CV LAB;  Service: Cardiovascular;  Laterality: N/A;   Patient Active Problem List   Diagnosis Date Noted   Essential hypertension 11/30/2022   Migraine 11/30/2022   Chronic kidney disease, stage 3a (HCC) 11/30/2022   Controlled type 2 diabetes mellitus without complication, without long-term current use of insulin (HCC) 11/30/2022   HLD (hyperlipidemia) 11/30/2022   Acute CVA (cerebrovascular accident) (HCC) 11/25/2022    ONSET DATE: 11/25/2022  REFERRING DIAG: I63.9 (ICD-10-CM) - Cerebrovascular accident (CVA), unspecified mechanism   THERAPY DIAG:  Muscle weakness (generalized)  Unsteadiness on feet  Dizziness and giddiness  Other abnormalities of gait and mobility  Rationale for Evaluation and Treatment:  Rehabilitation  SUBJECTIVE:                                                                                                                                                                                             SUBJECTIVE STATEMENT: OT reports they have worked on vision today, and they are awaiting Dr. Laruth Bouchard follow up regarding vision.  Rested a lot over the weekend.  Sunday was a good day. Pt accompanied by: self  PERTINENT HISTORY: a-fib, DM, hx of migraines, hx of back surgery, multiple knee surgeries (per pt report)  PAIN:  Are you having pain? Yes: NPRS scale: 4/10 Pain location: headache Pain description: dull headache Aggravating factors: sunlight, noise Relieving factors: darkness, quietness  PRECAUTIONS: Fall and Other: loop recorder; requires supervision at all times  WEIGHT BEARING RESTRICTIONS: No  FALLS: Has patient fallen in last 6 months? Yes. Number of falls 1 with his initial CVA  LIVING ENVIRONMENT: Lives with: lives with their spouse Lives in: House/apartment Stairs: Yes: External: 3 steps; on right going up.  Home has basement, but he doesn't go down into basement. Has following equipment at home: Single point cane, Walker - 2 wheeled, and bed side commode  PLOF: Independent.  [place], mowing.  Enjoys seeing grandson play baseball.  PATIENT GOALS: To get back to normal  OBJECTIVE:       TODAY'S TREATMENT: 01/25/2023 Activity Comments  Sidestepping along counter x 3 minutes   Forward/back walking at counter x 3 minutes, 3# weight LLE Cues initially to pick up L foot  Tandem gait, 8 reps along counter Good stability  Sit to stand x 5 reps through session, with extra time, using visual target to stabilize upon standing   Standing feet apart, feet together EO and EC, 30 sec on solid surface   Balance strategy work: -heel/toe raises x 10 -stagger stance forward/back rocking x 10, then with added reaching task -wide  BOS lateral rocking x 10  UE support and cues for movement technique, improves with repetition   Access Code: [email or web address] URL: [email or web address] Date: 01/25/2023 Prepared by: [facility] - Outpatient  Rehab - [facility]  Exercises - Seated March  - 1 x daily - 5 x weekly - 3 sets - 10 reps - Seated Long Arc Quad  - 1 x daily - 5 x weekly - 3 sets - 10 reps - Pencil Pushups  - 2 x daily - 7 x weekly - 1 sets - 5 reps - Sit to Stand with Counter Support  - 1 x daily - 5 x weekly - 2 sets - 10 reps - Seated Left Head Turns Vestibular Habituation  - 1 x daily - 5 x weekly - 2-3 sets - 30 sec hold - Seated Head Nod  - 1 x daily - 5 x weekly - 2-3 sets - 30 sec hold - Backward Walking with Counter Support  - 1 x daily - 5 x weekly - 1 sets - 5 reps - Tandem Walking with Counter Support  - 1 x daily - 7 x weekly - 3 sets - 10 reps  PATIENT EDUCATION: Education details: Updates to HEP Person educated: Patient Education method: Programmer, multimedia, Demonstration, and Handouts Education comprehension: verbalized understanding and returned demonstration       Below measures were taken at time of initial evaluation unless otherwise specified:    VESTIBULAR ASSESSMENT   GENERAL OBSERVATION: No acute distress    SYMPTOM BEHAVIOR:   Subjective history: Recent hx of CVA and since then he feels like he is being pulled backward or to the side.  Wife (at eval) notes veering with gait.   Non-Vestibular symptoms: diplopia, migraine symptoms, and CVA 11/25/22   Type of dizziness: Imbalance (Disequilibrium) and Unsteady with head/body turns.  Feels like he is going backwards   Frequency: throughout the day   Duration: with movement   Aggravating factors: Induced by position change: sit to stand and Induced by motion: occur when walking   Relieving factors: slow movements   Progression of symptoms: unchanged  OCULOMOTOR EXAM:   Ocular Alignment: abnormal and L eye slightly  lower   Ocular ROM: No Limitations and double vision with upper motion   Spontaneous Nystagmus: absent   Gaze-Induced Nystagmus: absent   Smooth Pursuits: intact   Saccades: slow, diplopia in upward vertical direction   Convergence/Divergence: 30 inches, then 19 inches (notes blurry vision farther away)    VESTIBULAR - OCULAR REFLEX:    Slow VOR: Comment: double vision horizontal and vertical, rates dizziness as 4/10   VOR Cancellation: Comment: reports dizziness 8/10   Head-Impulse Test: NT          -------------------------------------------------------------- Objective measures below taken at the initial evaluation:  DIAGNOSTIC FINDINGS: 1. 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 2. Underlying mild age-related cerebral atrophy with moderate chronic microvascular ischemic disease.  COGNITION: Overall cognitive status: Impaired   SENSATION: Light touch: WFL and reports sometimes having tingling on left foot  COORDINATION: Decreased LLE  MUSCLE TONE: WFL BLEs  VITALS:  137/85, HR 71  POSTURE: rounded shoulders and posterior pelvic tilt  LOWER EXTREMITY ROM:   in sitting, Active ROM WFL BLEs   LOWER EXTREMITY MMT:    MMT Right Eval Left Eval  Hip flexion 4+ 4  Hip extension    Hip abduction    Hip adduction    Hip internal rotation    Hip external rotation    Knee flexion 4 3+  Knee extension 4 3+  Ankle dorsiflexion 4 3+  Ankle plantarflexion    Ankle inversion    Ankle eversion    (Blank rows = not tested)   TRANSFERS: Assistive device utilized: None  Sit to stand: CGA Stand to sit: CGA Slowed, ataxic movements with transfers GAIT: Gait pattern: step through pattern, shuffling, ataxic, and wide BOS; veers to L Distance walked: 60 ft Assistive device utilized: Environmental consultant - 2 wheeled Level of assistance: CGA Comments: Tends to vent to L  FUNCTIONAL TESTS:  5 times sit to stand: 25.34 sec without UE support 10  meter walk test: NT Berg Balance Scale: 22/56     PATIENT SURVEYS:  FOTO Intake score 50; predicted 69    GOALS: Goals reviewed with patient? Yes  SHORT TERM GOALS: Target date: 01/07/2023  Pt will be supervision with HEP for improved balance, strength, gait. Baseline: Goal status: IN PROGRESS  2.  Pt will improve 5x sit<>stand to less than or equal to 20 sec to demonstrate improved functional strength and transfer efficiency.  Baseline: 25.34 sec Goal status: IN PROGRESS  3.  Pt will improve Berg score to at least 30/56 to decrease fall risk. Baseline: 22/56>34/56 Goal status: MET  4.  Further vestibular testing to be completed and goal set as appropriate. Baseline:  Goal status: IN PROGRESS   LONG TERM GOALS: Target date: 02/04/2023  Pt will be supervision with HEP for improved strength, balance, gait. Baseline:  Goal status: IN PROGRESS  2.  Pt will improve 5x sit<>stand to less than or equal to 15 sec to demonstrate improved functional strength and transfer efficiency. Baseline: 25.34 sec Goal status: IN PROGRESS  3.  Pt will improve Berg score to at least 40/56 to decrease fall risk. Baseline: 22/56 Goal status: IN PROGRESS  4.  FOTO score to improve to 69 to demo improved overall functional mobility. Baseline: 50 Goal status: IN PROGRESS  5.  Further vestibular testing to be completed/goal written as appropriate. Baseline:  Goal status: IN PROGRESS  6.  Pt will ambulate  at least 1000 ft indoor and outdoor surfaces, with appropriate assistive device, mod I, for outdoor and community gait. Baseline:  Goal status: IN PROGRESS  ASSESSMENT:  CLINICAL IMPRESSION: Worked on balance, weightshifting, limits of stability and progress to dynamic balance at counter, with pt performing exercises well.  Added to HEP for tandem gait and forward/back walking at counter.  Continued to reinforce use of visual target to help with straight line walking to help with  avoiding obstacles on L side, as pt reports continueing to bump into things on left at home.  He will continue to benefit from skilled PT towards LTGs for improved strength, balance, gait for return to independence and decreased fall risk. OBJECTIVE IMPAIRMENTS: Abnormal gait, decreased balance, decreased knowledge of use of DME, decreased mobility, difficulty walking, decreased ROM, decreased strength, decreased safety awareness, dizziness, and postural dysfunction.   ACTIVITY LIMITATIONS: carrying, lifting, bending, standing, squatting, transfers, bathing, toileting, dressing, and locomotion level  PARTICIPATION LIMITATIONS: meal prep, cleaning, laundry, community activity, and yard work  PERSONAL FACTORS: 3+ comorbidities: see PMH; also potential visual/ vestibular component to pt's balance that needs to be further assessed  are also affecting patient's functional outcome.   REHAB POTENTIAL: Good  CLINICAL DECISION MAKING: Evolving/moderate complexity  EVALUATION COMPLEXITY: Moderate  PLAN:  PT FREQUENCY: 2x/week  PT DURATION: 8 weeks plus eval  PLANNED INTERVENTIONS: Therapeutic exercises, Therapeutic activity, Neuromuscular re-education, Balance training, Gait training, Patient/Family education, Self Care, Vestibular training, Canalith repositioning, and DME instructions  PLAN FOR NEXT SESSION: Review addition to HEP, continue tandem stance, SLS, Romberg.  Progress balance and strength, compliant surfaces; continue working on convergence, VOR, habituation in functional activities, avoiding obstacles L side   Lonia Blood, PT 01/25/23 2:17 PM Phone: [phone] Fax: [phone]   [facility] at [facility] [address] [place], [place] [address] Phone # [phone] Fax # [phone]

## 2023-01-25 NOTE — Therapy (Signed)
OUTPATIENT SPEECH LANGUAGE PATHOLOGY TREATMENT   [name] MRN: [id number] DOB:Oct 27, 1950, 72 y.o., male Today's Date: 01/25/2023  PCP: Sean Haste, MD REFERRING PROVIDER: [name], MD  Sean Haste MD - doc)  END OF SESSION:  End of Session - 01/25/23 1416     Visit Number 10    Number of Visits 25    Date for SLP Re-Evaluation 03/09/23    SLP Start Time 1402    SLP Stop Time  1445    SLP Time Calculation (min) 43 min    Activity Tolerance Patient tolerated treatment well                Past Medical History:  Diagnosis Date   Diabetes mellitus without complication (HCC)    Migraine    Stroke [facility])    Past Surgical History:  Procedure Laterality Date   BACK SURGERY     GIVENS CAPSULE STUDY N/A 05/05/2015   Procedure: GIVENS CAPSULE STUDY;  Surgeon: Sean Elizabeth, MD;  Location: [facility] ENDOSCOPY;  Service: Endoscopy;  Laterality: N/A;   LOOP RECORDER INSERTION N/A 11/29/2022   Procedure: LOOP RECORDER INSERTION;  Surgeon: Sean Small, MD;  Location: [facility] INVASIVE CV LAB;  Service: Cardiovascular;  Laterality: N/A;   Patient Active Problem List   Diagnosis Date Noted   Essential hypertension 11/30/2022   Migraine 11/30/2022   Chronic kidney disease, stage 3a (HCC) 11/30/2022   Controlled type 2 diabetes mellitus without complication, without long-term current use of insulin (HCC) 11/30/2022   HLD (hyperlipidemia) 11/30/2022   Acute CVA (cerebrovascular accident) (HCC) 11/25/2022    ONSET DATE: 11/25/22   REFERRING DIAG: CVA  THERAPY DIAG:  No diagnosis found.  Rationale for Evaluation and Treatment: Rehabilitation  SUBJECTIVE:   SUBJECTIVE STATEMENT: "After we've been gone all morning I get home and I'm washed. She lets me rest a bit and then we have dinner at 4-5 o clock. The other day my daughter in law and her mother came and she (mother) had a lot of trouble understanding me."  Pt accompanied by: self  PERTINENT  HISTORY: presented 11/25/22 with AMS, blurred vision, headache, and L-sided numbness and weakness. MRI revealed patchy [name] volume acute ischemic posterior right MCA territory infarct. PMH: DM, migraine  PAIN:  Are you having pain? Yes: NPRS scale: 6/10 Pain location: head Pain description: headache Aggravating factors: activity Relieving factors: rest  PATIENT GOALS: Improve communication skills  OBJECTIVE:   DIAGNOSTIC FINDINGS:  BSE 11/26/22 Clinical Impression   Pt reports no swallowing difficulties PTA. He has L side neglect that affects his ability to attend to POs presented on that side and appropriately raise them to his mouth. Oral motor exam revealed L side asymmetry and mild difficulty coodinating some movements. Pt exhibited difficulty forming a labial seal given thin liquids via straw and purees via spoon as well as oral holding during trials of solid textures. He had no observed s/s of aspiration throughout PO trials. Due to pt's awareness deficits and overall mentation, recommend Dys 2 (finely chopped) and thin liquids with full supervision. SLP will f/u as able to address potential for trials of upgraded textures as mentation improves.    COGNITION: Overall cognitive status: Impaired Areas of impairment:  Attention: Impaired: Sustained, Selective, Alternating, Divided Memory: Impaired: Short term Executive function: Impaired: Slow processing Functional deficits: Pt states he is talking and then loses train of thought often. He is no longer managing his medication (see "S" statement) nor his appointments. "  If I had a calendar (in the kitchen) it would help." Formal cognitive assessment may be warranted in the next few sessions.     STANDARDIZED ASSESSMENTS: CLQT: Attention: WNL, Memory: Mild, Executive Function: WNL, Language: WNL, Visuospatial Skills: WNL, and Clock Drawing: Mild Pt scored under the "cut score" for three subtests: clock drawing, mazes, and design  generation. These subtests do rely somewhat on vision -which, at this time pt complains of "double-vision". Anecdotally, pt has some sx of focused attention deficits such as overly verbose at times and losing track of his point, or the topic, and exhibiting rambling speech. SLP to cont to monitor.    PATIENT REPORTED OUTCOME MEASURES (PROM): Cognitive Function: 71/140 for higher scores indicating better cognition/better QOL relative to cognition. Score of "1" or "occurs very often" with the following areas: Indicating making simple mistakes easily, difficulty thinking of specific words, reading something several times to understand it, alternating attention, remembering someone's name, slower reaction time in conversation, more difficulty forming thoughts, slow thinking, working hard to pay attention, and trouble concentrating. With Communication Effectiveness Survey: 16/32 with higher scores indicating better QOL/ better communication effectiveness (only "1" being having a conversation with someone across a room) .  TODAY'S TREATMENT:                                                                                                                                         DATE:  01/25/23: [name] told SLP that his voice is about the same volume as prior to CVA. The only person asks him to repeat is his wife "and her hearing is not that good." [name] does think it's necessary to work on the volume of his speech at this time. It is bothersome to him that he cannot recall family names as readily as before, nor can he recall birthdays. SLP suggested he write these down. Homework is to write down these names and dates and BRING THEM next time.   5/15/24[name] enters today with fine details about the weekend without cues. Pt stated he completed "hey - ahhhh" x5 with average mid 70s dB with mod cues for louder production encouraged..Average in conversational segments of 10 minutes average mid-upper 60s. Pt stated he  had not had any difficulty with people understanding him since previous session.   01/14/23: Pt expresses to SLP the details of his eye appointment yesterday without extra time necessary for recalling details. SLP inquired directly about his intelligibility after dinner and pt stated he still has more difficulty with people understanding him. Pt is completing HEP once/day and SLP encouraged him to complete BID, and explained rationale. SLP taught pt loud /a/ today and pt average was mid 70s dB with SLP mod cues for increasing volume - noted mild vocal tremor with these productions. Pt had more success with loudness when paired with initial "HEY!". SLP told pt to complete 10 loud /a/  BID.  01/10/23: Pt volume was sub 70 dB for entire session with verbalization. Pt returned his PROMs today and scored above. He recalled he needed to turn in his PROMs and homework with extra time - presented to SLP 15 minutes into session: "I  had to have [name] read off some of the questions because they were too [name]. My bifocals are really hard to see with now." 100% success with homework.  Pt tells SLP he feels his speech intelligibility is much better and now the volume of his speech is the primary area making people have to ask him to repeat. Sent note home with pt to ascertain if pt's loudness was more of a deficit than pt's speech intelligibility, which has improved dramatically since initial eval.  Pt stated he hasn't coughed with meals in a while. Today in session he had multiple consecutive sips H2O without any overt s/sx difficulty.  01/05/23: Pt did not complete PROMs , nor homework due to ill yesterday. Very dizzy upon entry to ST today due to ride to therapies today.  SLP measured pt's voice volume - average mid 60s dB. SLP inquired and pt stated wife told him today "for first time in a long time that I was mumbling." SLP educated pt about energy conservation and management; pt demonstrated understanding.  01/03/23:  SLP completed CLQT, with results as above. Pt began session with 5 1/4 minute explanation about why he had experience with a cane, demonstrating possible deficit with focused or internal selective attention.  Pt's speech volume was measured as WNL today in 12 minutes of focused conversation (including initial conversation about pt's cane experiences). PT without anomia today in this session.  12/22/22: Two coughing episodes since last session - one Sunday on tea and one today with water. SLP to continue to track these. Pt without overt s/sx aspiration PNA today. SLP cont'd with pt's dysarthria HEP today and he req'd min-mod A usually for stressing/strong lingual sounds on the HEP, and for procedure. Pt stated he was performing HEP once/day.  CLQT initiated today. By end of session (15 minutes with CLQT) pt's HA increased in pain from 4/10 to 6/10. During the last 15 minutes of session (during CLQT) pt demonstrated word finding difficulty x2; Extra time (<3 seconds) necessary for pt to be successful. Prior to this, pt without difficulty in min-mod complex conversation.   12/17/22: Perform CLQT next session. Pt took medicines this morning and he did them WNL. MD visit yesterday brought new meds and deleted some others and pt to pick those up today.  SLP engaged pt in salient min-mod complex conversation and pt had 4 anomic errors which he compensated for. SLP provided him homework for word finding.  12/15/22: Pt did not bring in cough log - forgot he was supposed to do this. SLP made a rudimentary log for pt to take. SLP discussed his medication administration with him today and see pt instructions for the result of this conversation. SLP educated how pain can interfere with rehab treatment and encouraged pt to take pain reliever if necessary prior to rehab appointments.  Pt with dysnomia (Daughter instead of daughter in law, and carport instead of garage) which were both corrected by pt.   12/09/22 (eval):  SLP went through portion of pt's HEP for oral strengthening. Full program will need to be completed with pt next time.  PATIENT EDUCATION: Education details: Eval results, HEP for lingual weakness and labial weakness Person educated: Patient and Spouse Education method:  Explanation, Demonstration, Verbal cues, and Handouts Education comprehension: verbalized understanding, returned demonstration, verbal cues required, and needs further education   GOALS: Goals reviewed with patient? Yes  SHORT TERM GOALS: Target date: 01/16/23  Pt will report improved communication (speech intelligibility) after dinner between 3 sessions Baseline: 01/10/23 Goal status: Partially met  2.  Pt will bring cough log to two of first 4 sessions; pt will be referred for MBS if clinically warranted Baseline: 12/22/22 Goal status: Partially met  3.  Pt will undergo cognitive assessment in first 3 sessions Baseline:  Goal status: Met  4.  Pt speech volume in 5 minutes simple conversation will improve to WNL in 3 sessions Baseline: 01/03/23, 01/10/23 Goal status: Met   LONG TERM GOALS: Target date: 03/09/23  Pt's PROM measures will increase compared to first measurement during first therapy session Baseline:  Goal status: Ongoing  2.  Pt speech volume in 10 minutes mod complex conversation will improve to WNL in 3 sessions Baseline: 01/10/23, 01/25/23 Goal status: Ongoing  3.  Pt will report improved communication (speech intelligibility) after dinner between 3 sessions (after 01/16/23) Baseline: 01/25/23 Goal status: Ongoing    ASSESSMENT:  CLINICAL IMPRESSION: Patient is a 72 y.o. male who is seen in ST for speech intelligibility and cognitive communication abilities following a CVA 11-25-22. MBS does not appear necessary at this time as pt does not recall coughing episodes. Considering decr to once/week for progress.  OBJECTIVE IMPAIRMENTS: include attention, memory, expressive language, receptive  language, and dysarthria. These impairments are limiting patient from managing medications, managing appointments, household responsibilities, ADLs/IADLs, effectively communicating at home and in community, and safety when swallowing. Factors affecting potential to achieve goals and functional outcome are  none noted today . Patient will benefit from skilled SLP services to address above impairments and improve overall function.  REHAB POTENTIAL: Good  PLAN:  SLP FREQUENCY: 2x/week  SLP DURATION: 12 weeks  PLANNED INTERVENTIONS: Environmental controls, Cueing hierachy, Cognitive reorganization, Internal/external aids, Oral motor exercises, Functional tasks, SLP instruction and feedback, Compensatory strategies, Patient/family education, and MBS    Syrina Wake, CCC-SLP 01/25/2023, 2:16 PM

## 2023-01-27 ENCOUNTER — Ambulatory Visit: Payer: Medicare Other | Admitting: Occupational Therapy

## 2023-01-27 ENCOUNTER — Ambulatory Visit: Payer: Medicare Other

## 2023-01-27 DIAGNOSIS — R41841 Cognitive communication deficit: Secondary | ICD-10-CM | POA: Diagnosis not present

## 2023-01-27 DIAGNOSIS — R42 Dizziness and giddiness: Secondary | ICD-10-CM

## 2023-01-27 DIAGNOSIS — R471 Dysarthria and anarthria: Secondary | ICD-10-CM

## 2023-01-27 DIAGNOSIS — M6281 Muscle weakness (generalized): Secondary | ICD-10-CM

## 2023-01-27 DIAGNOSIS — R2681 Unsteadiness on feet: Secondary | ICD-10-CM

## 2023-01-27 DIAGNOSIS — R2689 Other abnormalities of gait and mobility: Secondary | ICD-10-CM

## 2023-01-27 DIAGNOSIS — R278 Other lack of coordination: Secondary | ICD-10-CM

## 2023-01-27 DIAGNOSIS — R41842 Visuospatial deficit: Secondary | ICD-10-CM

## 2023-01-27 NOTE — Therapy (Signed)
OUTPATIENT SPEECH LANGUAGE PATHOLOGY TREATMENT   [name] MRN: [id number] DOB:1951-06-28, 72 y.o., male Today's Date: 01/25/2023  PCP: Antony Haste, MD REFERRING PROVIDER: [name], MD  Antony Haste MD - doc)  END OF SESSION:  End of Session - 01/25/23 1416     Visit Number 10    Number of Visits 25    Date for SLP Re-Evaluation 03/09/23    SLP Start Time 1402    SLP Stop Time  1445    SLP Time Calculation (min) 43 min    Activity Tolerance Patient tolerated treatment well                Past Medical History:  Diagnosis Date   Diabetes mellitus without complication (HCC)    Migraine    Stroke [facility])    Past Surgical History:  Procedure Laterality Date   BACK SURGERY     GIVENS CAPSULE STUDY N/A 05/05/2015   Procedure: GIVENS CAPSULE STUDY;  Surgeon: Charna Elizabeth, MD;  Location: [facility] ENDOSCOPY;  Service: Endoscopy;  Laterality: N/A;   LOOP RECORDER INSERTION N/A 11/29/2022   Procedure: LOOP RECORDER INSERTION;  Surgeon: Maurice Small, MD;  Location: [facility] INVASIVE CV LAB;  Service: Cardiovascular;  Laterality: N/A;   Patient Active Problem List   Diagnosis Date Noted   Essential hypertension 11/30/2022   Migraine 11/30/2022   Chronic kidney disease, stage 3a (HCC) 11/30/2022   Controlled type 2 diabetes mellitus without complication, without long-term current use of insulin (HCC) 11/30/2022   HLD (hyperlipidemia) 11/30/2022   Acute CVA (cerebrovascular accident) (HCC) 11/25/2022    ONSET DATE: 11/25/22   REFERRING DIAG: CVA  THERAPY DIAG:  No diagnosis found.  Rationale for Evaluation and Treatment: Rehabilitation  SUBJECTIVE:   SUBJECTIVE STATEMENT: "She gets the medication for me because I just can't see the bottles now with my vision."  Pt accompanied by: self  PERTINENT HISTORY: presented 11/25/22 with AMS, blurred vision, headache, and L-sided numbness and weakness. MRI revealed patchy small volume acute ischemic  posterior right MCA territory infarct. PMH: DM, migraine  PAIN:  Are you having pain? Yes: NPRS scale: 4/10 Pain location: head Pain description: headache Aggravating factors: activity Relieving factors: rest  PATIENT GOALS: Improve communication skills  OBJECTIVE:   DIAGNOSTIC FINDINGS:  BSE 11/26/22 Clinical Impression   Pt reports no swallowing difficulties PTA. He has L side neglect that affects his ability to attend to POs presented on that side and appropriately raise them to his mouth. Oral motor exam revealed L side asymmetry and mild difficulty coodinating some movements. Pt exhibited difficulty forming a labial seal given thin liquids via straw and purees via spoon as well as oral holding during trials of solid textures. He had no observed s/s of aspiration throughout PO trials. Due to pt's awareness deficits and overall mentation, recommend Dys 2 (finely chopped) and thin liquids with full supervision. SLP will f/u as able to address potential for trials of upgraded textures as mentation improves.    COGNITION: Overall cognitive status: Impaired Areas of impairment:  Attention: Impaired: Sustained, Selective, Alternating, Divided Memory: Impaired: Short term Executive function: Impaired: Slow processing Functional deficits: Pt states he is talking and then loses train of thought often. He is no longer managing his medication (see "S" statement) nor his appointments. "If I had a calendar (in the kitchen) it would help." Formal cognitive assessment may be warranted in the next few sessions.     STANDARDIZED ASSESSMENTS: CLQT: Attention: WNL,  Memory: Mild, Executive Function: WNL, Language: WNL, Visuospatial Skills: WNL, and Clock Drawing: Mild Pt scored under the "cut score" for three subtests: clock drawing, mazes, and design generation. These subtests do rely somewhat on vision -which, at this time pt complains of "double-vision". Anecdotally, pt has some sx of focused  attention deficits such as overly verbose at times and losing track of his point, or the topic, and exhibiting rambling speech. SLP to cont to monitor.    PATIENT REPORTED OUTCOME MEASURES (PROM): Cognitive Function: 71/140 for higher scores indicating better cognition/better QOL relative to cognition. Score of "1" or "occurs very often" with the following areas: Indicating making simple mistakes easily, difficulty thinking of specific words, reading something several times to understand it, alternating attention, remembering someone's name, slower reaction time in conversation, more difficulty forming thoughts, slow thinking, working hard to pay attention, and trouble concentrating. With Communication Effectiveness Survey: 16/32 with higher scores indicating better QOL/ better communication effectiveness (only "1" being having a conversation with someone across a room) .  TODAY'S TREATMENT:                                                                                                                                         DATE:  01/27/23: Pt reported to SLP that wife gets meds and he takes them at appropriate time. He does not do med box because he cannot open it well right now. Wife is keeping track of appointments mainly due to pt's eyesight very difficult to see the schedule printout. SLP asked pt today when he returned and he indicated the correct day after SLP oriented him to the schedule printout due to his eyesight. "It's like things cross and I can't focus," pt stated to SLP. Pt indicated he is satisfied with his speech at this time.  Pt told SLP all family birthdays correctly. "Me and [name] went over these so I could tell them to you," pt told SLP.  01/25/23: [name] told SLP that his voice is about the same volume as prior to CVA. The only person asks him to repeat is his wife "and her hearing is not that good." [name] does think it's necessary to work on the volume of his speech at this time. It  is bothersome to him that he cannot recall family names as readily as before, nor can he recall birthdays. SLP suggested he write these down. Homework is to write down these names and dates and BRING THEM next time.   5/15/24[name] enters today with fine details about the weekend without cues. Pt stated he completed "hey - ahhhh" x5 with average mid 70s dB with mod cues for louder production encouraged..Average in conversational segments of 10 minutes average mid-upper 60s. Pt stated he had not had any difficulty with people understanding him since previous session.   01/14/23: Pt expresses to SLP the details of his eye appointment  yesterday without extra time necessary for recalling details. SLP inquired directly about his intelligibility after dinner and pt stated he still has more difficulty with people understanding him. Pt is completing HEP once/day and SLP encouraged him to complete BID, and explained rationale. SLP taught pt loud /a/ today and pt average was mid 70s dB with SLP mod cues for increasing volume - noted mild vocal tremor with these productions. Pt had more success with loudness when paired with initial "HEY!". SLP told pt to complete 10 loud /a/ BID.  01/10/23: Pt volume was sub 70 dB for entire session with verbalization. Pt returned his PROMs today and scored above. He recalled he needed to turn in his PROMs and homework with extra time - presented to SLP 15 minutes into session: "I  had to have [name] read off some of the questions because they were too small. My bifocals are really hard to see with now." 100% success with homework.  Pt tells SLP he feels his speech intelligibility is much better and now the volume of his speech is the primary area making people have to ask him to repeat. Sent note home with pt to ascertain if pt's loudness was more of a deficit than pt's speech intelligibility, which has improved dramatically since initial eval.  Pt stated he hasn't coughed with meals in a  while. Today in session he had multiple consecutive sips H2O without any overt s/sx difficulty.  01/05/23: Pt did not complete PROMs , nor homework due to ill yesterday. Very dizzy upon entry to ST today due to ride to therapies today.  SLP measured pt's voice volume - average mid 60s dB. SLP inquired and pt stated wife told him today "for first time in a long time that I was mumbling." SLP educated pt about energy conservation and management; pt demonstrated understanding.  01/03/23: SLP completed CLQT, with results as above. Pt began session with 5 1/4 minute explanation about why he had experience with a cane, demonstrating possible deficit with focused or internal selective attention.  Pt's speech volume was measured as WNL today in 12 minutes of focused conversation (including initial conversation about pt's cane experiences). PT without anomia today in this session.  12/22/22: Two coughing episodes since last session - one Sunday on tea and one today with water. SLP to continue to track these. Pt without overt s/sx aspiration PNA today. SLP cont'd with pt's dysarthria HEP today and he req'd min-mod A usually for stressing/strong lingual sounds on the HEP, and for procedure. Pt stated he was performing HEP once/day.  CLQT initiated today. By end of session (15 minutes with CLQT) pt's HA increased in pain from 4/10 to 6/10. During the last 15 minutes of session (during CLQT) pt demonstrated word finding difficulty x2; Extra time (<3 seconds) necessary for pt to be successful. Prior to this, pt without difficulty in min-mod complex conversation.   12/17/22: Perform CLQT next session. Pt took medicines this morning and he did them WNL. MD visit yesterday brought new meds and deleted some others and pt to pick those up today.  SLP engaged pt in salient min-mod complex conversation and pt had 4 anomic errors which he compensated for. SLP provided him homework for word finding.  12/15/22: Pt did not bring in  cough log - forgot he was supposed to do this. SLP made a rudimentary log for pt to take. SLP discussed his medication administration with him today and see pt instructions for the result of this  conversation. SLP educated how pain can interfere with rehab treatment and encouraged pt to take pain reliever if necessary prior to rehab appointments.  Pt with dysnomia (Daughter instead of daughter in law, and carport instead of garage) which were both corrected by pt.   12/09/22 (eval): SLP went through portion of pt's HEP for oral strengthening. Full program will need to be completed with pt next time.  PATIENT EDUCATION: Education details: Eval results, HEP for lingual weakness and labial weakness Person educated: Patient and Spouse Education method: Explanation, Demonstration, Verbal cues, and Handouts Education comprehension: verbalized understanding, returned demonstration, verbal cues required, and needs further education   GOALS: Goals reviewed with patient? Yes  SHORT TERM GOALS: Target date: 01/16/23  Pt will report improved communication (speech intelligibility) after dinner between 3 sessions Baseline: 01/10/23 Goal status: Partially met  2.  Pt will bring cough log to two of first 4 sessions; pt will be referred for MBS if clinically warranted Baseline: 12/22/22 Goal status: Partially met  3.  Pt will undergo cognitive assessment in first 3 sessions Baseline:  Goal status: Met  4.  Pt speech volume in 5 minutes simple conversation will improve to WNL in 3 sessions Baseline: 01/03/23, 01/10/23 Goal status: Met   LONG TERM GOALS: Target date: 03/09/23  Pt's PROM measures will increase compared to first measurement during first therapy session Baseline:  Goal status: Ongoing  2.  Pt speech volume in 10 minutes mod complex conversation will improve to WNL in 3 sessions Baseline: 01/10/23, 01/25/23 Goal status: Ongoing  3.  Pt will report improved communication (speech  intelligibility) after dinner between 3 sessions (after 01/16/23) Baseline: 01/25/23 Goal status: Ongoing    ASSESSMENT:  CLINICAL IMPRESSION: Patient is a 72 y.o. male who is seen in ST for speech intelligibility and cognitive communication abilities following a CVA 11-25-22. MBS does not appear necessary at this time as pt does not recall coughing episodes. Considering decr to once/week for progress.  OBJECTIVE IMPAIRMENTS: include attention, memory, expressive language, receptive language, and dysarthria. These impairments are limiting patient from managing medications, managing appointments, household responsibilities, ADLs/IADLs, effectively communicating at home and in community, and safety when swallowing. Factors affecting potential to achieve goals and functional outcome are  none noted today . Patient will benefit from skilled SLP services to address above impairments and improve overall function.  REHAB POTENTIAL: Good  PLAN:  SLP FREQUENCY: 2x/week  SLP DURATION: 12 weeks  PLANNED INTERVENTIONS: Environmental controls, Cueing hierachy, Cognitive reorganization, Internal/external aids, Oral motor exercises, Functional tasks, SLP instruction and feedback, Compensatory strategies, Patient/family education, and MBS    Undray Allman, CCC-SLP 01/25/2023, 2:16 PM

## 2023-01-27 NOTE — Therapy (Signed)
OUTPATIENT PHYSICAL THERAPY NEURO TREATMENT   [name] MRN: [id number] DOB:11/26/1950, 72 y.o., male Today's Date: 01/27/2023   PCP: [name], MD REFERRING PROVIDER: [name], MD >to send to PCP, [name]     END OF SESSION:  PT End of Session - 01/27/23 1522     Visit Number 12    Number of Visits 17    Date for PT Re-Evaluation 02/04/23    Authorization Type [employer]    Progress Note Due on Visit 20    PT Start Time 1530    PT Stop Time 1615    PT Time Calculation (min) 45 min    Equipment Utilized During Treatment Gait belt    Activity Tolerance Patient tolerated treatment well    Behavior During Therapy WFL for tasks assessed/performed                Past Medical History:  Diagnosis Date   Diabetes mellitus without complication (HCC)    Migraine    Stroke [facility])    Past Surgical History:  Procedure Laterality Date   BACK SURGERY     GIVENS CAPSULE STUDY N/A 05/05/2015   Procedure: GIVENS CAPSULE STUDY;  Surgeon: Charna Elizabeth, MD;  Location: [facility] ENDOSCOPY;  Service: Endoscopy;  Laterality: N/A;   LOOP RECORDER INSERTION N/A 11/29/2022   Procedure: LOOP RECORDER INSERTION;  Surgeon: Maurice Small, MD;  Location: [facility] INVASIVE CV LAB;  Service: Cardiovascular;  Laterality: N/A;   Patient Active Problem List   Diagnosis Date Noted   Essential hypertension 11/30/2022   Migraine 11/30/2022   Chronic kidney disease, stage 3a (HCC) 11/30/2022   Controlled type 2 diabetes mellitus without complication, without long-term current use of insulin (HCC) 11/30/2022   HLD (hyperlipidemia) 11/30/2022   Acute CVA (cerebrovascular accident) (HCC) 11/25/2022    ONSET DATE: 11/25/2022  REFERRING DIAG: I63.9 (ICD-10-CM) - Cerebrovascular accident (CVA), unspecified mechanism   THERAPY DIAG:  Muscle weakness (generalized)  Unsteadiness on feet  Dizziness and giddiness  Other abnormalities of gait and mobility  Rationale for  Evaluation and Treatment: Rehabilitation  SUBJECTIVE:                                                                                                                                                                                             SUBJECTIVE STATEMENT: So far so good Pt accompanied by: self  PERTINENT HISTORY: a-fib, DM, hx of migraines, hx of back surgery, multiple knee surgeries (per pt report)  PAIN:  Are you having pain? Yes: NPRS scale: 4/10 Pain location: headache Pain description: dull headache Aggravating factors:  sunlight, noise Relieving factors: darkness, quietness  PRECAUTIONS: Fall and Other: loop recorder; requires supervision at all times  WEIGHT BEARING RESTRICTIONS: No  FALLS: Has patient fallen in last 6 months? Yes. Number of falls 1 with his initial CVA  LIVING ENVIRONMENT: Lives with: lives with their spouse Lives in: House/apartment Stairs: Yes: External: 3 steps; on right going up.  Home has basement, but he doesn't go down into basement. Has following equipment at home: Single point cane, Walker - 2 wheeled, and bed side commode  PLOF: Independent.  [place], mowing.  Enjoys seeing grandson play baseball.  PATIENT GOALS: To get back to normal  OBJECTIVE:    TODAY'S TREATMENT: 01/27/23 Activity Comments  Seated/standing cross midline LUE reaching across midline transferring cones from low to high. Increased dizziness w/ eye/head movements  Seated VOR x 1 Significant issues with gaze stability in horizontal  Standing VOR x 1  -horizontal in front of mirror using eyes for target to improve feedback/stability  balance -feet together EO x 30 sec -feet together EC 3x15 sec -semi-tandem EO/EC x 15 sec -lift-offs 10x 6" step              Access Code: [email or web address] URL: [email or web address] Date: 01/25/2023 Prepared by: [facility] - Outpatient  Rehab - [facility]  Exercises - Seated March  -  1 x daily - 5 x weekly - 3 sets - 10 reps - Seated Long Arc Quad  - 1 x daily - 5 x weekly - 3 sets - 10 reps - Pencil Pushups  - 2 x daily - 7 x weekly - 1 sets - 5 reps - Sit to Stand with Counter Support  - 1 x daily - 5 x weekly - 2 sets - 10 reps - Seated Left Head Turns Vestibular Habituation  - 1 x daily - 5 x weekly - 2-3 sets - 30 sec hold - Seated Head Nod  - 1 x daily - 5 x weekly - 2-3 sets - 30 sec hold - Backward Walking with Counter Support  - 1 x daily - 5 x weekly - 1 sets - 5 reps - Tandem Walking with Counter Support  - 1 x daily - 7 x weekly - 3 sets - 10 reps - Seated Gaze Stabilization with Head Rotation  - 1 x daily - 7 x weekly - 3-5 sets - 15-30 sec hold  PATIENT EDUCATION: Education details: Updates to HEP Person educated: Patient Education method: Programmer, multimedia, Demonstration, and Handouts Education comprehension: verbalized understanding and returned demonstration       Below measures were taken at time of initial evaluation unless otherwise specified:    VESTIBULAR ASSESSMENT   GENERAL OBSERVATION: No acute distress    SYMPTOM BEHAVIOR:   Subjective history: Recent hx of CVA and since then he feels like he is being pulled backward or to the side.  Wife (at eval) notes veering with gait.   Non-Vestibular symptoms: diplopia, migraine symptoms, and CVA 11/25/22   Type of dizziness: Imbalance (Disequilibrium) and Unsteady with head/body turns.  Feels like he is going backwards   Frequency: throughout the day   Duration: with movement   Aggravating factors: Induced by position change: sit to stand and Induced by motion: occur when walking   Relieving factors: slow movements   Progression of symptoms: unchanged   OCULOMOTOR EXAM:   Ocular Alignment: abnormal and L eye slightly lower   Ocular ROM: No Limitations and double  vision with upper motion   Spontaneous Nystagmus: absent   Gaze-Induced Nystagmus: absent   Smooth Pursuits: intact   Saccades: slow,  diplopia in upward vertical direction   Convergence/Divergence: 30 inches, then 19 inches (notes blurry vision farther away)    VESTIBULAR - OCULAR REFLEX:    Slow VOR: Comment: double vision horizontal and vertical, rates dizziness as 4/10   VOR Cancellation: Comment: reports dizziness 8/10   Head-Impulse Test: NT          -------------------------------------------------------------- Objective measures below taken at the initial evaluation:  DIAGNOSTIC FINDINGS: 1. 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 2. Underlying mild age-related cerebral atrophy with moderate chronic microvascular ischemic disease.  COGNITION: Overall cognitive status: Impaired   SENSATION: Light touch: WFL and reports sometimes having tingling on left foot  COORDINATION: Decreased LLE  MUSCLE TONE: WFL BLEs  VITALS:  137/85, HR 71  POSTURE: rounded shoulders and posterior pelvic tilt  LOWER EXTREMITY ROM:   in sitting, Active ROM WFL BLEs   LOWER EXTREMITY MMT:    MMT Right Eval Left Eval  Hip flexion 4+ 4  Hip extension    Hip abduction    Hip adduction    Hip internal rotation    Hip external rotation    Knee flexion 4 3+  Knee extension 4 3+  Ankle dorsiflexion 4 3+  Ankle plantarflexion    Ankle inversion    Ankle eversion    (Blank rows = not tested)   TRANSFERS: Assistive device utilized: None  Sit to stand: CGA Stand to sit: CGA Slowed, ataxic movements with transfers GAIT: Gait pattern: step through pattern, shuffling, ataxic, and wide BOS; veers to L Distance walked: 60 ft Assistive device utilized: Environmental consultant - 2 wheeled Level of assistance: CGA Comments: Tends to vent to L  FUNCTIONAL TESTS:  5 times sit to stand: 25.34 sec without UE support 10 meter walk test: NT Berg Balance Scale: 22/56     PATIENT SURVEYS:  FOTO Intake score 50; predicted 69    GOALS: Goals reviewed with patient? Yes  SHORT TERM GOALS: Target  date: 01/07/2023  Pt will be supervision with HEP for improved balance, strength, gait. Baseline: Goal status: IN PROGRESS  2.  Pt will improve 5x sit<>stand to less than or equal to 20 sec to demonstrate improved functional strength and transfer efficiency.  Baseline: 25.34 sec Goal status: IN PROGRESS  3.  Pt will improve Berg score to at least 30/56 to decrease fall risk. Baseline: 22/56>34/56 Goal status: MET  4.  Further vestibular testing to be completed and goal set as appropriate. Baseline:  Goal status: IN PROGRESS   LONG TERM GOALS: Target date: 02/04/2023  Pt will be supervision with HEP for improved strength, balance, gait. Baseline:  Goal status: IN PROGRESS  2.  Pt will improve 5x sit<>stand to less than or equal to 15 sec to demonstrate improved functional strength and transfer efficiency. Baseline: 25.34 sec Goal status: IN PROGRESS  3.  Pt will improve Berg score to at least 40/56 to decrease fall risk. Baseline: 22/56 Goal status: IN PROGRESS  4.  FOTO score to improve to 69 to demo improved overall functional mobility. Baseline: 50 Goal status: IN PROGRESS  5.  Further vestibular testing to be completed/goal written as appropriate. Baseline:  Goal status: IN PROGRESS  6.  Pt will ambulate at least 1000 ft indoor and outdoor surfaces, with appropriate assistive device, mod I, for outdoor and community gait. Baseline:  Goal status: IN PROGRESS  ASSESSMENT:  CLINICAL IMPRESSION: Initiated activities with repeated reaching across midline to improve coordination and motor control. Pt notes visual disturbance with eye/head movements. Saccades reveal hypometric movements and increased time for fixation. VOR x 1 reveals impairment horizontal > vertical with both saccadic intrusions and moving off target. Improved performance standing in front of mirror using his eyes as fixation target. Continued with drills for static balance/postural stability under  multi-sensory demands. Increased difficulty with LLE single limb support. Continued sessions to progress POC details and improve functional mobility to reduce risk fro falls OBJECTIVE IMPAIRMENTS: Abnormal gait, decreased balance, decreased knowledge of use of DME, decreased mobility, difficulty walking, decreased ROM, decreased strength, decreased safety awareness, dizziness, and postural dysfunction.   ACTIVITY LIMITATIONS: carrying, lifting, bending, standing, squatting, transfers, bathing, toileting, dressing, and locomotion level  PARTICIPATION LIMITATIONS: meal prep, cleaning, laundry, community activity, and yard work  PERSONAL FACTORS: 3+ comorbidities: see PMH; also potential visual/ vestibular component to pt's balance that needs to be further assessed  are also affecting patient's functional outcome.   REHAB POTENTIAL: Good  CLINICAL DECISION MAKING: Evolving/moderate complexity  EVALUATION COMPLEXITY: Moderate  PLAN:  PT FREQUENCY: 2x/week  PT DURATION: 8 weeks plus eval  PLANNED INTERVENTIONS: Therapeutic exercises, Therapeutic activity, Neuromuscular re-education, Balance training, Gait training, Patient/Family education, Self Care, Vestibular training, Canalith repositioning, and DME instructions  PLAN FOR NEXT SESSION: Review addition to HEP, continue tandem stance, SLS, Romberg.  Progress balance and strength, compliant surfaces; continue working on convergence, VOR, habituation in functional activities, avoiding obstacles L side   4:27 PM, 01/27/23 M. Shary Decamp, PT, DPT Physical Therapist- [place] Office Number: [phone]

## 2023-01-27 NOTE — Therapy (Signed)
OUTPATIENT OCCUPATIONAL THERAPY NEURO  Treatment Note  [name] MRN: [id number] DOB:01/24/1951, 71 y.o., male Today's Date: 01/27/2023  PCP: Antony Haste REFERRING PROVIDER: Lynden Oxford      END OF SESSION:  OT End of Session - 01/27/23 1515     Visit Number 12    Number of Visits 17    Date for OT Re-Evaluation 02/07/23    Authorization Type [employer]    Progress Note Due on Visit 10    OT Start Time 1450    OT Stop Time 1530    OT Time Calculation (min) 40 min    Behavior During Therapy WFL for tasks assessed/performed                        Past Medical History:  Diagnosis Date   Diabetes mellitus without complication (HCC)    Migraine    Stroke [facility])    Past Surgical History:  Procedure Laterality Date   BACK SURGERY     GIVENS CAPSULE STUDY N/A 05/05/2015   Procedure: GIVENS CAPSULE STUDY;  Surgeon: Charna Elizabeth, MD;  Location: [facility] ENDOSCOPY;  Service: Endoscopy;  Laterality: N/A;   LOOP RECORDER INSERTION N/A 11/29/2022   Procedure: LOOP RECORDER INSERTION;  Surgeon: Maurice Small, MD;  Location: [facility] INVASIVE CV LAB;  Service: Cardiovascular;  Laterality: N/A;   Patient Active Problem List   Diagnosis Date Noted   Essential hypertension 11/30/2022   Migraine 11/30/2022   Chronic kidney disease, stage 3a (HCC) 11/30/2022   Controlled type 2 diabetes mellitus without complication, without long-term current use of insulin (HCC) 11/30/2022   HLD (hyperlipidemia) 11/30/2022   Acute CVA (cerebrovascular accident) (HCC) 11/25/2022    ONSET DATE: 11/25/22  REFERRING DIAG: R MCA CVA  THERAPY DIAG:  Muscle weakness (generalized)  Unsteadiness on feet  Other lack of coordination  Visuospatial deficit  Rationale for Evaluation and Treatment: Rehabilitation  SUBJECTIVE:   SUBJECTIVE STATEMENT: Pt reports having skunks on his property that they are trying to get rid off. Pt accompanied by: self (spouse dropped  off pt)  PERTINENT HISTORY: DM II, Neuropathy, CKD, Migraine  PRECAUTIONS: Fall  WEIGHT BEARING RESTRICTIONS: No  PAIN:  Are you having pain? Yes: NPRS scale: 4/10 Pain location: headache Pain description: shooting Aggravating factors: move eyes, turn head; riding in the car Relieving factors: rest  FALLS: Has patient fallen in last 6 months? Yes. Number of falls 1  LIVING ENVIRONMENT: Lives with: lives with their spouse Lives in: House/apartment Has following equipment at home:  Has tons of adaptive equipment from prior family members, uses shower seat, walker, has a cane, handicapped height toilet  PLOF: Independent with basic ADLs  PATIENT GOALS: feel like myself again  OBJECTIVE:   HAND DOMINANCE: Right  ADLs: Overall ADLs: supervision Transfers/ambulation related to ADLs: walks with supervision to min assist with RW Eating: Independent Grooming: supervision UB Dressing: independent LB Dressing: supervision Toileting: supervision Bathing: supervision Tub Shower transfers: close supervision Equipment: Shower seat with back and handicapped height toilet  IADLs: Shopping: NT Light housekeeping: Not participating at this time Meal Prep: Not participating at this time Community mobility: Not driving Medication management: wife Banker: wife manages Handwriting:  NT  MOBILITY STATUS: difficulty carrying objections with ambulation  POSTURE COMMENTS:  No Significant postural limitations   ACTIVITY TOLERANCE: Activity tolerance: Patient has difficulty with fatigue - reports not sleeping well at night, feels tired during the day  FUNCTIONAL  OUTCOME MEASURES: FOTO: 63.75 01/21/23: 63  UPPER EXTREMITY ROM:    Active ROM Right eval Left eval  Shoulder flexion [facility]  Shoulder abduction THRUOUT THRUOUT  Shoulder adduction    Shoulder extension    Shoulder internal rotation    Shoulder external rotation    Elbow flexion    Elbow  extension    Wrist flexion    Wrist extension    Wrist ulnar deviation    Wrist radial deviation    Wrist pronation    Wrist supination    (Blank rows = not tested)  UPPER EXTREMITY MMT:     MMT Right eval Left eval  Shoulder flexion 4/5 4+/5  Shoulder abduction 4+/5 THRUOUT  Shoulder adduction THRUOUT   Shoulder extension    Shoulder internal rotation    Shoulder external rotation    Middle trapezius    Lower trapezius    Elbow flexion    Elbow extension    Wrist flexion    Wrist extension    Wrist ulnar deviation    Wrist radial deviation    Wrist pronation    Wrist supination    (Blank rows = not tested)  HAND FUNCTION: Grip strength: Right: 80 lbs; Left: 76 lbs and Lateral pinch: Right: 20 lbs, Left: 20 lbs  COORDINATION: Finger Nose Finger test: undershooting consistently with LUE 9 Hole Peg test: Right: 29.68 sec; Left: 36.25 sec 01/19/23: L: 34.50 sec 01/27/23: BOX and BLOCKS: R: 50 and L: 44  SENSATION: Light touch: Impaired  Stereognosis: Not tested Proprioception: WFL  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 Wife reports he was initially very confused - even hallucinating.  Improving but she reports he often is off topic in conversation  VISION: Subjective report: reports change in peripheral vision Baseline vision: Wears glasses for reading only Visual history: cataracts, retinal issues in left eye  VISION ASSESSMENT: Impaired Eye alignment: Impaired: mild Ocular ROM: impaired Tracking/Visual pursuits: Decreased smoothness with horizontal tracking and Decreased smoothness with vertical tracking Saccades: undershoots and decreased speed of saccadic movements Convergence: Impaired:   Visual Fields: Left visual field deficits Diplopia  assessment: present in Left gaze needs further testing - seems more apparent vertically.  Need to differentiate between gaze stabilization and diploia  Patient has difficulty with following activities due to following visual impairments: reading, navigating environment  PERCEPTION: Impaired: Spatial orientation: posterior bias  PRAXIS: WFL     TODAY'S TREATMENT:                                                              01/27/23 Box and blocks: L: 44 and R: 50. Pt reports double vision impacting ease with picking up blocks.   Vision: engaged placing straw into peg hole to further challenge single vision. Pt frequently compensating with head turns and closing eye.  OT set up task in vertical and horizontal plane to challenge single vision in both planes.  Pt demonstrating increased difficulty with vertical plane. Dynamic mobility: engaged in functional mobility around room to incorporate balance while picking up cones from floor.  Pt requiring close supervision  and intermittent CGA for balance, demonstrating LOB x2 requiring min assist to correct.  Pt reporting significant dizziness after bending to retreive items.  Pt reports picking items up at home, frequently stabilizing self at wall or other sturdy surface due to decreased balance. Pen tricks: instructed in rotation, flip, translation, and shift with use of pen.  OT providing demonstration and cues for quality of movement during each activity. Pt with increased difficulty with shift, but able to complete all with cues for technique and attention to quality of movement.  In-Hand Manipulation Skills Rotation:  Hold pen, try to "twirl" like a baton, keeping flat with surface of table. Try going BOTH directions 10x  Flip:  Hold pen in writing position, flip in an arch to "erase" position, then back to "write" position. Do not lift hand off table.  10x  Translation:  Open hand palm up,  put an object in your palm and then use your fingers and  thumb to move it to the tips of your fingers, pinched against your thumb.  10x (bigger is easier (fat marker), smaller is harder (penny)) Shift:  Hold pen like a dart, start "shifting" it forward until you are holding it at the base, then shift it backwards until you are holding it at the tip again. 10x (like putting a key in a key hole)    01/25/23 Vision: attempted to utilize Dobbins chart to further address vision with focusing on alternating between near and far vision.  Pt able to read letters on chart ~ 3 M away with increased time to make each letter clear in monocular vision.  Pt requiring small chart to be 26 cm from eyes to make out letters.  Pt unable to alternate from near to far vision.  Improved ease of reading line when far chart brought closer to 5-6 feet away, however still with inability to alternate from near to far. Coordination: engaged in peg board pattern replication on vertical surface to challenge vertical vision with pattern above peg board.  Pt demonstrating min challenges with diagonal lines and need to turn head ~90* to ensure lines in correct place.  Pt occasionally closing one eye to achieve monocular vision.    01/21/23 Hand Gripper: with LUE on level 70# with silver spring. Pt picked up 1 inch blocks with gripper with 2 drops and min difficulty. Completed with RUE on level 70# with silver spring. Pt picked up 1 inch blocks with gripper with 0 drops and no difficulty.  Pt reports noting increased dizziness/double vision when scanning vertically to place blocks in container. Dynamic standing: engaged in retrieving items from low stool in standing with LUE to challenge weight shifting, balance, and visual attention to progress towards retrieving items from floor. Close supervision provided due to reports of dizziness with head moving and eye shifting.  Unable to progress to pickings items from floor due to increased dizziness this session. UE ROM: engaged in ulnar nerve  gliding with LUE and RUE x10 with focus on quality of movement while discussing onset of numbness with and without movement. Visual scanning: completed Trail A in 1:14 and Trail B in 2:31 due to diplopia and difficulty with sustained vision for increased time as well as difficulty with alternating attention between sequencing numbers and letters.    PATIENT EDUCATION: Education details: ongoing condition specific education Person educated: Patient Education method: Explanation Education comprehension: needs further education  HOME EXERCISE PROGRAM: Coordination handout  Access Code: [email or web address] URL: [email or web address] Date: 12/15/2022 Prepared by:  [facility] - Outpatient  Rehab - [facility]  Exercises - Seated Horizontal Smooth Pursuit  - 2 x daily - 7 x weekly - 1 sets - 5 reps   GOALS: Goals reviewed with patient? No  SHORT TERM GOALS: Target date: 01/07/23  Patient will complete home exercise program designed to improve coordination in left UE Goal status: MET  2.  Patient will complete an HEP designed to improve visual motor skills  Goal status: MET  3.  Patient will demonstrate improved awareness of visual management strategies for diplopia, and gaze stabilization.  Goal status: IN PROGRESS  4.  Patient will complete a shower with distant supervision  Goal status: MET - 01/10/23 reports completing Mod I  5.  Patient will reach to floor to obtain lightweight object to put on overhead shelf without loss of balance.    Goal status: IN PROGRESS   LONG TERM GOALS: Target date: 02/07/23  Patient will return to simple cooking - hot meal for he and his wife with intermittent min assistance and supervision  Goal status: IN PROGRESS  2.  Patient will read news article in increased font (14+) x 5 min with 100% comprehension   Goal status: IN PROGRESS  3.  Patient will demonstrate awareness of return to driving recommendations  Goal status: IN  PROGRESS  4.  Patient will report effective simple hand tool use for simple household repairs  Goal status: IN PROGRESS  5.  Patient will demonstrate at least 3 second improvement in 9 hole peg test LUE  Goal status: IN PROGRESS  6.  Patient will safely return to simple outside maintenance - gardening, mowing with supervision  Goal status: IN PROGRESS  ASSESSMENT:  CLINICAL IMPRESSION: Pt demonstrating good ability to utilize compensatory strategies and accommodate for diplopia during tasks at table top level and in standing this session. CGA provided during ambulation to incorporate retrieving items from floor to challenge balance and visual perceptual skills with diplopia and dizziness with head and eye movements.   PERFORMANCE DEFICITS: in functional skills including ADLs, IADLs, coordination, sensation, strength, pain, Fine motor control, Gross motor control, mobility, balance, endurance, decreased knowledge of use of DME, vision, and UE functional use, cognitive skills including attention, energy/drive, memory, problem solving, safety awareness, and sequencing, and psychosocial skills including habits and routines and behaviors.   IMPAIRMENTS: are limiting patient from ADLs, IADLs, and leisure.   CO-MORBIDITIES: may have co-morbidities  that affects occupational performance. Patient will benefit from skilled OT to address above impairments and improve overall function.  MODIFICATION OR ASSISTANCE TO COMPLETE EVALUATION: Min-Moderate modification of tasks or assist with assess necessary to complete an evaluation.  OT OCCUPATIONAL PROFILE AND HISTORY: Detailed assessment: Review of records and additional review of physical, cognitive, psychosocial history related to current functional performance.  CLINICAL DECISION MAKING: Moderate - several treatment options, min-mod task modification necessary  REHAB POTENTIAL: Good  EVALUATION COMPLEXITY: Moderate    PLAN:  OT FREQUENCY:  2x/week  OT DURATION: 8 weeks  PLANNED INTERVENTIONS: self care/ADL training, therapeutic exercise, therapeutic activity, neuromuscular re-education, balance training, functional mobility training, patient/family education, cognitive remediation/compensation, visual/perceptual remediation/compensation, and DME and/or AE instructions  RECOMMENDED OTHER SERVICES: Possibly Neuro-optometry  CONSULTED AND AGREED WITH PLAN OF CARE: Patient and family member/caregiver  PLAN FOR NEXT SESSION: Needs stand balance training - dynamic.  Needs env't scanning, and gaze stabilization/ eye teaming activity.  Coordination.   Rosalio Loud, OT 01/27/2023, 3:15 PM

## 2023-01-27 NOTE — Patient Instructions (Signed)
In-Hand Manipulation Skills Rotation:  Hold pen, try to "twirl" like a baton, keeping flat with surface of table. Try going BOTH directions 10x For increased challenge, can complete with coin.  Flip:  Hold pen in writing position, flip in an arch to "erase" position, then back to "write" position. Do not lift hand off table.  10x   Translation:  Open hand palm up, put an object in your palm and then use your fingers and thumb to move it to the tips of your fingers, pinched against your thumb.  10x (Bigger is easier (fat marker), smaller is harder (penny))  Shift:  Hold pen like a dart, start "shifting" it forward until you are holding it at the base, then shift it backwards until you are holding it at the tip again. 10x (like putting a key in a key hole)  

## 2023-01-28 NOTE — Therapy (Signed)
OUTPATIENT PHYSICAL THERAPY NEURO TREATMENT   [name] MRN: [id number] DOB:1951/03/27, 72 y.o., male Today's Date: 02/01/2023   PCP: [name], MD REFERRING PROVIDER: [name], MD >to send to PCP, [name]     END OF SESSION:  PT End of Session - 02/01/23 1444     Visit Number 13    Number of Visits 17    Date for PT Re-Evaluation 02/04/23    Authorization Type [employer]    Progress Note Due on Visit 20    PT Start Time 1404    PT Stop Time 1443    PT Time Calculation (min) 39 min    Equipment Utilized During Treatment Gait belt    Activity Tolerance Patient tolerated treatment well    Behavior During Therapy WFL for tasks assessed/performed                 Past Medical History:  Diagnosis Date   Diabetes mellitus without complication (HCC)    Migraine    Stroke [facility])    Past Surgical History:  Procedure Laterality Date   BACK SURGERY     GIVENS CAPSULE STUDY N/A 05/05/2015   Procedure: GIVENS CAPSULE STUDY;  Surgeon: Charna Elizabeth, MD;  Location: [facility] ENDOSCOPY;  Service: Endoscopy;  Laterality: N/A;   LOOP RECORDER INSERTION N/A 11/29/2022   Procedure: LOOP RECORDER INSERTION;  Surgeon: Maurice Small, MD;  Location: [facility] INVASIVE CV LAB;  Service: Cardiovascular;  Laterality: N/A;   Patient Active Problem List   Diagnosis Date Noted   Essential hypertension 11/30/2022   Migraine 11/30/2022   Chronic kidney disease, stage 3a (HCC) 11/30/2022   Controlled type 2 diabetes mellitus without complication, without long-term current use of insulin (HCC) 11/30/2022   HLD (hyperlipidemia) 11/30/2022   Acute CVA (cerebrovascular accident) (HCC) 11/25/2022    ONSET DATE: 11/25/2022  REFERRING DIAG: I63.9 (ICD-10-CM) - Cerebrovascular accident (CVA), unspecified mechanism   THERAPY DIAG:  Muscle weakness (generalized)  Unsteadiness on feet  Dizziness and giddiness  Other abnormalities of gait and mobility  Other lack of  coordination  Rationale for Evaluation and Treatment: Rehabilitation  SUBJECTIVE:                                                                                                                                                                                             SUBJECTIVE STATEMENT: "Doing good, as to be expected." Has walked into some things, but has not fallen (with cane). Still getting dizzy when standing up, riding in the car, and still getting HAs.   Pt accompanied by: self  PERTINENT HISTORY:  a-fib, DM, hx of migraines, hx of back surgery, multiple knee surgeries (per pt report)  PAIN:  Are you having pain? Yes: NPRS scale: 4/10 Pain location: headache Pain description: dull headache Aggravating factors: sunlight, noise Relieving factors: darkness, quietness  PRECAUTIONS: Fall and Other: loop recorder; requires supervision at all times  WEIGHT BEARING RESTRICTIONS: No  FALLS: Has patient fallen in last 6 months? Yes. Number of falls 1 with his initial CVA  LIVING ENVIRONMENT: Lives with: lives with their spouse Lives in: House/apartment Stairs: Yes: External: 3 steps; on right going up.  Home has basement, but he doesn't go down into basement. Has following equipment at home: Single point cane, Walker - 2 wheeled, and bed side commode  PLOF: Independent.  [place], mowing.  Enjoys seeing grandson play baseball.  PATIENT GOALS: To get back to normal  OBJECTIVE:     TODAY'S TREATMENT: 02/01/23 Activity Comments  Review addition to HEP:  backwards walking x tandem walk x sitting horizontal VOR 30" Standing exercises performed at II bars with CGA. Small and slow head movements with VOR- c/o moderate increase in dizziness   STS + gaze on targets 5x CGA-min A d/t imbalance   figure 8 turns around hula hoops, then cones  Narrow BOS, walking slowly and gingerly; standing rest breaks in between to prevent excessive dizziness   walking around gym, picking up cones for habituation and balance challenge  Poor safety awareness when bending to pick items off the ground; required CGA for safety  in II bars: Sidestepping x1 min romberg + head turns/nods 10x each 1/4 and 1/2 turns to targets Moderate sway with head movements. Mild-mod sway with turns, requiring occasional hand support                 Access Code: [email or web address] URL: [email or web address] Date: 01/25/2023 Prepared by: [facility] - Outpatient  Rehab - [facility]  Exercises - Seated March  - 1 x daily - 5 x weekly - 3 sets - 10 reps - Seated Long Arc Quad  - 1 x daily - 5 x weekly - 3 sets - 10 reps - Pencil Pushups  - 2 x daily - 7 x weekly - 1 sets - 5 reps - Sit to Stand with Counter Support  - 1 x daily - 5 x weekly - 2 sets - 10 reps - Seated Left Head Turns Vestibular Habituation  - 1 x daily - 5 x weekly - 2-3 sets - 30 sec hold - Seated Head Nod  - 1 x daily - 5 x weekly - 2-3 sets - 30 sec hold - Backward Walking with Counter Support  - 1 x daily - 5 x weekly - 1 sets - 5 reps - Tandem Walking with Counter Support  - 1 x daily - 7 x weekly - 3 sets - 10 reps - Seated Gaze Stabilization with Head Rotation  - 1 x daily - 7 x weekly - 3-5 sets - 15-30 sec hold       Below measures were taken at time of initial evaluation unless otherwise specified:    VESTIBULAR ASSESSMENT   GENERAL OBSERVATION: No acute distress    SYMPTOM BEHAVIOR:   Subjective history: Recent hx of CVA and since then he feels like he is being pulled backward or to the side.  Wife (at eval) notes veering with gait.   Non-Vestibular symptoms: diplopia, migraine symptoms, and CVA 11/25/22  Type of dizziness: Imbalance (Disequilibrium) and Unsteady with head/body turns.  Feels like he is going backwards   Frequency: throughout the day   Duration: with movement   Aggravating factors: Induced by position change: sit to stand and Induced by motion:  occur when walking   Relieving factors: slow movements   Progression of symptoms: unchanged   OCULOMOTOR EXAM:   Ocular Alignment: abnormal and L eye slightly lower   Ocular ROM: No Limitations and double vision with upper motion   Spontaneous Nystagmus: absent   Gaze-Induced Nystagmus: absent   Smooth Pursuits: intact   Saccades: slow, diplopia in upward vertical direction   Convergence/Divergence: 30 inches, then 19 inches (notes blurry vision farther away)    VESTIBULAR - OCULAR REFLEX:    Slow VOR: Comment: double vision horizontal and vertical, rates dizziness as 4/10   VOR Cancellation: Comment: reports dizziness 8/10   Head-Impulse Test: NT          -------------------------------------------------------------- Objective measures below taken at the initial evaluation:  DIAGNOSTIC FINDINGS: 1. 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 2. Underlying mild age-related cerebral atrophy with moderate chronic microvascular ischemic disease.  COGNITION: Overall cognitive status: Impaired   SENSATION: Light touch: WFL and reports sometimes having tingling on left foot  COORDINATION: Decreased LLE  MUSCLE TONE: WFL BLEs  VITALS:  137/85, HR 71  POSTURE: rounded shoulders and posterior pelvic tilt  LOWER EXTREMITY ROM:   in sitting, Active ROM WFL BLEs   LOWER EXTREMITY MMT:    MMT Right Eval Left Eval  Hip flexion 4+ 4  Hip extension    Hip abduction    Hip adduction    Hip internal rotation    Hip external rotation    Knee flexion 4 3+  Knee extension 4 3+  Ankle dorsiflexion 4 3+  Ankle plantarflexion    Ankle inversion    Ankle eversion    (Blank rows = not tested)   TRANSFERS: Assistive device utilized: None  Sit to stand: CGA Stand to sit: CGA Slowed, ataxic movements with transfers GAIT: Gait pattern: step through pattern, shuffling, ataxic, and wide BOS; veers to L Distance walked: 60 ft Assistive device  utilized: Environmental consultant - 2 wheeled Level of assistance: CGA Comments: Tends to vent to L  FUNCTIONAL TESTS:  5 times sit to stand: 25.34 sec without UE support 10 meter walk test: NT Berg Balance Scale: 22/56     PATIENT SURVEYS:  FOTO Intake score 50; predicted 69    GOALS: Goals reviewed with patient? Yes  SHORT TERM GOALS: Target date: 01/07/2023  Pt will be supervision with HEP for improved balance, strength, gait. Baseline: Goal status: IN PROGRESS  2.  Pt will improve 5x sit<>stand to less than or equal to 20 sec to demonstrate improved functional strength and transfer efficiency.  Baseline: 25.34 sec Goal status: IN PROGRESS  3.  Pt will improve Berg score to at least 30/56 to decrease fall risk. Baseline: 22/56>34/56 Goal status: MET  4.  Further vestibular testing to be completed and goal set as appropriate. Baseline:  Goal status: IN PROGRESS   LONG TERM GOALS: Target date: 02/04/2023  Pt will be supervision with HEP for improved strength, balance, gait. Baseline:  Goal status: IN PROGRESS  2.  Pt will improve 5x sit<>stand to less than or equal to 15 sec to demonstrate improved functional strength and transfer efficiency. Baseline: 25.34 sec Goal status: IN PROGRESS  3.  Pt will improve Sharlene Motts  score to at least 40/56 to decrease fall risk. Baseline: 22/56 Goal status: IN PROGRESS  4.  FOTO score to improve to 69 to demo improved overall functional mobility. Baseline: 50 Goal status: IN PROGRESS  5.  Further vestibular testing to be completed/goal written as appropriate. Baseline:  Goal status: IN PROGRESS  6.  Pt will ambulate at least 1000 ft indoor and outdoor surfaces, with appropriate assistive device, mod I, for outdoor and community gait. Baseline:  Goal status: IN PROGRESS  ASSESSMENT:  CLINICAL IMPRESSION: Patient arrived to session without change in symptoms. Reviewed previous HEP update for max  carryover- patient still with dizziness with  gaze stabilization and STS activities. Worked on turns around obstacles with focus on improving speed, step length, tolerance for turns. Patient with instability when performing bending to pick items from the floor, requiring cueing for hand placement for max safety. Instability also evident with head and body turns. Patient tolerated session well and without complaints at end of session.  OBJECTIVE IMPAIRMENTS: Abnormal gait, decreased balance, decreased knowledge of use of DME, decreased mobility, difficulty walking, decreased ROM, decreased strength, decreased safety awareness, dizziness, and postural dysfunction.   ACTIVITY LIMITATIONS: carrying, lifting, bending, standing, squatting, transfers, bathing, toileting, dressing, and locomotion level  PARTICIPATION LIMITATIONS: meal prep, cleaning, laundry, community activity, and yard work  PERSONAL FACTORS: 3+ comorbidities: see PMH; also potential visual/ vestibular component to pt's balance that needs to be further assessed  are also affecting patient's functional outcome.   REHAB POTENTIAL: Good  CLINICAL DECISION MAKING: Evolving/moderate complexity  EVALUATION COMPLEXITY: Moderate  PLAN:  PT FREQUENCY: 2x/week  PT DURATION: 8 weeks plus eval  PLANNED INTERVENTIONS: Therapeutic exercises, Therapeutic activity, Neuromuscular re-education, Balance training, Gait training, Patient/Family education, Self Care, Vestibular training, Canalith repositioning, and DME instructions  PLAN FOR NEXT SESSION:  continue tandem stance, SLS, Romberg.  Progress balance and strength, compliant surfaces; continue working on convergence, VOR, habituation in functional activities, avoiding obstacles L side  Anette Guarneri, PT, DPT 02/01/23 2:45 PM  [facility] at [facility] [address], [address] [place], [place] [address] Phone # [phone] Fax # [phone]

## 2023-02-01 ENCOUNTER — Ambulatory Visit: Payer: Medicare Other | Admitting: Occupational Therapy

## 2023-02-01 ENCOUNTER — Ambulatory Visit: Payer: Medicare Other

## 2023-02-01 ENCOUNTER — Ambulatory Visit: Payer: Medicare Other | Admitting: Physical Therapy

## 2023-02-01 ENCOUNTER — Encounter: Payer: Self-pay | Admitting: Physical Therapy

## 2023-02-01 DIAGNOSIS — R2681 Unsteadiness on feet: Secondary | ICD-10-CM

## 2023-02-01 DIAGNOSIS — R278 Other lack of coordination: Secondary | ICD-10-CM

## 2023-02-01 DIAGNOSIS — R2689 Other abnormalities of gait and mobility: Secondary | ICD-10-CM

## 2023-02-01 DIAGNOSIS — R41841 Cognitive communication deficit: Secondary | ICD-10-CM

## 2023-02-01 DIAGNOSIS — M6281 Muscle weakness (generalized): Secondary | ICD-10-CM

## 2023-02-01 DIAGNOSIS — R42 Dizziness and giddiness: Secondary | ICD-10-CM

## 2023-02-01 DIAGNOSIS — R1310 Dysphagia, unspecified: Secondary | ICD-10-CM

## 2023-02-01 DIAGNOSIS — R471 Dysarthria and anarthria: Secondary | ICD-10-CM

## 2023-02-01 DIAGNOSIS — R41842 Visuospatial deficit: Secondary | ICD-10-CM

## 2023-02-01 NOTE — Therapy (Signed)
OUTPATIENT SPEECH LANGUAGE PATHOLOGY TREATMENT   [name] MRN: [id number] DOB:08-15-1951, 72 y.o., male Today's Date: 02/01/2023  PCP: Antony Haste, MD REFERRING PROVIDER: [name], MD  Antony Haste MD - doc)  END OF SESSION:  End of Session - 02/01/23 1327     Visit Number 12    Number of Visits 25    Date for SLP Re-Evaluation 03/09/23    SLP Start Time 1321    SLP Stop Time  1400    SLP Time Calculation (min) 39 min    Activity Tolerance Patient tolerated treatment well                Past Medical History:  Diagnosis Date   Diabetes mellitus without complication (HCC)    Migraine    Stroke [facility])    Past Surgical History:  Procedure Laterality Date   BACK SURGERY     GIVENS CAPSULE STUDY N/A 05/05/2015   Procedure: GIVENS CAPSULE STUDY;  Surgeon: Charna Elizabeth, MD;  Location: [facility] ENDOSCOPY;  Service: Endoscopy;  Laterality: N/A;   LOOP RECORDER INSERTION N/A 11/29/2022   Procedure: LOOP RECORDER INSERTION;  Surgeon: Maurice Small, MD;  Location: [facility] INVASIVE CV LAB;  Service: Cardiovascular;  Laterality: N/A;   Patient Active Problem List   Diagnosis Date Noted   Essential hypertension 11/30/2022   Migraine 11/30/2022   Chronic kidney disease, stage 3a (HCC) 11/30/2022   Controlled type 2 diabetes mellitus without complication, without long-term current use of insulin (HCC) 11/30/2022   HLD (hyperlipidemia) 11/30/2022   Acute CVA (cerebrovascular accident) (HCC) 11/25/2022    ONSET DATE: 11/25/22   REFERRING DIAG: CVA  THERAPY DIAG:  Cognitive communication deficit  Dysarthria and anarthria  Dysphagia, unspecified type  Rationale for Evaluation and Treatment: Rehabilitation  SUBJECTIVE:   SUBJECTIVE STATEMENT: "Everybody understood me great."  "It's one of the frustrating things for me right now." (Slower processing)  Pt accompanied by: self  PERTINENT HISTORY: presented 11/25/22 with AMS, blurred vision,  headache, and L-sided numbness and weakness. 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 PMH: DM, migraine  PAIN:  Are you having pain? Yes: NPRS scale: 4/10 Pain location: head Pain description: headache Aggravating factors: activity Relieving factors: rest  PATIENT GOALS: Improve communication skills  OBJECTIVE:   DIAGNOSTIC FINDINGS:  BSE 11/26/22 Clinical Impression   Pt reports no swallowing difficulties PTA. He has L side neglect that affects his ability to attend to POs presented on that side and appropriately raise them to his mouth. Oral motor exam revealed L side asymmetry and mild difficulty coodinating some movements. Pt exhibited difficulty forming a labial seal given thin liquids via straw and purees via spoon as well as oral holding during trials of solid textures. He had no observed s/s of aspiration throughout PO trials. Due to pt's awareness deficits and overall mentation, recommend Dys 2 (finely chopped) and thin liquids with full supervision. SLP will f/u as able to address potential for trials of upgraded textures as mentation improves.    PATIENT REPORTED OUTCOME MEASURES (PROM): Cognitive Function: 71/140 for higher scores indicating better cognition/better QOL relative to cognition. Score of "1" or "occurs very often" with the following areas: Indicating making simple mistakes easily, difficulty thinking of specific words, reading something several times to understand it, alternating attention, remembering someone's name, slower reaction time in conversation, more difficulty forming thoughts, slow thinking, working hard to pay attention, and trouble concentrating. With Communication Effectiveness Survey:  16/32 with higher scores indicating better QOL/ better communication effectiveness (only "1" being having a conversation with someone across a room) .  TODAY'S TREATMENT:                                                                                                                                          DATE:  02/01/23: Pt conversational volume WNL. Pt was around family yesterday, and cont to indicate nobody asks for repeats other than wife, rarely.  Pt demonstrated decr'd processing time/working memory (?) with practical auditory tasks such as telling total of different coin combinations, and requesting repeats for simple math word problems read aloud. Pt cont to complain of double vision which cont to make presenting pt with written tasks for attention challenging.   01/27/23: Pt reported to SLP that wife gets meds and he takes them at appropriate time. He does not do med box because he cannot open it well right now. Wife is keeping track of appointments mainly due to pt's eyesight very difficult to see the schedule printout. SLP asked pt today when he returned and he indicated the correct day after SLP oriented him to the schedule printout due to his eyesight. "It's like things cross and I can't focus," pt stated to SLP. Pt indicated he is satisfied with his speech at this time.  Pt told SLP all family birthdays correctly. "Me and [name] went over these so I could tell them to you," pt told SLP.  01/25/23: [name] told SLP that his voice is about the same volume as prior to CVA. The only person asks him to repeat is his wife "and her hearing is not that good." [name] does think it's necessary to work on the volume of his speech at this time. It is bothersome to him that he cannot recall family names as readily as before, nor can he recall birthdays. SLP suggested he write these down. Homework is to write down these names and dates and BRING THEM next time.   5/15/24[name] enters today with fine details about the weekend without cues. Pt stated he completed "hey - ahhhh" x5 with average mid 70s dB with mod cues for louder production encouraged..Average in conversational segments of 10 minutes average mid-upper 60s. Pt stated he had  not had any difficulty with people understanding him since previous session.   01/14/23: Pt expresses to SLP the details of his eye appointment yesterday without extra time necessary for recalling details. SLP inquired directly about his intelligibility after dinner and pt stated he still has more difficulty with people understanding him. Pt is completing HEP once/day and SLP encouraged him to complete BID, and explained rationale. SLP taught pt loud /a/ today and pt average was mid 70s dB with SLP mod cues for increasing volume - noted mild vocal tremor with these productions. Pt had more success with loudness when paired with initial "  HEY!". SLP told pt to complete 10 loud /a/ BID.  01/10/23: Pt volume was sub 70 dB for entire session with verbalization. Pt returned his PROMs today and scored above. He recalled he needed to turn in his PROMs and homework with extra time - presented to SLP 15 minutes into session: "I  had to have [name] read off some of the questions because they were too small. My bifocals are really hard to see with now." 100% success with homework.  Pt tells SLP he feels his speech intelligibility is much better and now the volume of his speech is the primary area making people have to ask him to repeat. Sent note home with pt to ascertain if pt's loudness was more of a deficit than pt's speech intelligibility, which has improved dramatically since initial eval.  Pt stated he hasn't coughed with meals in a while. Today in session he had multiple consecutive sips H2O without any overt s/sx difficulty.  01/05/23: Pt did not complete PROMs , nor homework due to ill yesterday. Very dizzy upon entry to ST today due to ride to therapies today.  SLP measured pt's voice volume - average mid 60s dB. SLP inquired and pt stated wife told him today "for first time in a long time that I was mumbling." SLP educated pt about energy conservation and management; pt demonstrated understanding.  01/03/23: SLP  completed CLQT, with results as above. Pt began session with 5 1/4 minute explanation about why he had experience with a cane, demonstrating possible deficit with focused or internal selective attention.  Pt's speech volume was measured as WNL today in 12 minutes of focused conversation (including initial conversation about pt's cane experiences). PT without anomia today in this session.  12/22/22: Two coughing episodes since last session - one Sunday on tea and one today with water. SLP to continue to track these. Pt without overt s/sx aspiration PNA today. SLP cont'd with pt's dysarthria HEP today and he req'd min-mod A usually for stressing/strong lingual sounds on the HEP, and for procedure. Pt stated he was performing HEP once/day.  CLQT initiated today. By end of session (15 minutes with CLQT) pt's HA increased in pain from 4/10 to 6/10. During the last 15 minutes of session (during CLQT) pt demonstrated word finding difficulty x2; Extra time (<3 seconds) necessary for pt to be successful. Prior to this, pt without difficulty in min-mod complex conversation.   12/17/22: Perform CLQT next session. Pt took medicines this morning and he did them WNL. MD visit yesterday brought new meds and deleted some others and pt to pick those up today.  SLP engaged pt in salient min-mod complex conversation and pt had 4 anomic errors which he compensated for. SLP provided him homework for word finding.  12/15/22: Pt did not bring in cough log - forgot he was supposed to do this. SLP made a rudimentary log for pt to take. SLP discussed his medication administration with him today and see pt instructions for the result of this conversation. SLP educated how pain can interfere with rehab treatment and encouraged pt to take pain reliever if necessary prior to rehab appointments.  Pt with dysnomia (Daughter instead of daughter in law, and carport instead of garage) which were both corrected by pt.   12/09/22 (eval): SLP  went through portion of pt's HEP for oral strengthening. Full program will need to be completed with pt next time.  PATIENT EDUCATION: Education details: Eval results, HEP for lingual weakness and  labial weakness Person educated: Patient and Spouse Education method: Explanation, Demonstration, Verbal cues, and Handouts Education comprehension: verbalized understanding, returned demonstration, verbal cues required, and needs further education   GOALS: Goals reviewed with patient? Yes  SHORT TERM GOALS: Target date: 01/16/23  Pt will report improved communication (speech intelligibility) after dinner between 3 sessions Baseline: 01/10/23 Goal status: Partially met  2.  Pt will bring cough log to two of first 4 sessions; pt will be referred for MBS if clinically warranted Baseline: 12/22/22 Goal status: Partially met  3.  Pt will undergo cognitive assessment in first 3 sessions Baseline:  Goal status: Met  4.  Pt speech volume in 5 minutes simple conversation will improve to WNL in 3 sessions Baseline: 01/03/23, 01/10/23 Goal status: Met   LONG TERM GOALS: Target date: 03/09/23  Pt's PROM measures will increase compared to first measurement during first therapy session Baseline:  Goal status: Ongoing  2.  Pt speech volume in 10 minutes mod complex conversation will improve to WNL in 3 sessions Baseline: 01/10/23, 01/25/23 Goal status: Met  3.  Pt will report improved communication (speech intelligibility) after dinner between 3 sessions (after 01/16/23) Baseline: 01/25/23, 02/01/23 Goal status: Ongoing  4.  Pt will report better processing speed with everyday tasks than at initial eval Baseline: Goal status: new     ASSESSMENT:  CLINICAL IMPRESSION: Patient is a 72 y.o. male who is seen in ST for speech intelligibility and cognitive communication abilities following a CVA 11-25-22. See treatment note for more details. SLP added goal for processing speed, as OT is not addressing  cognition.  OBJECTIVE IMPAIRMENTS: include attention, memory, expressive language, receptive language, and dysarthria. These impairments are limiting patient from managing medications, managing appointments, household responsibilities, ADLs/IADLs, effectively communicating at home and in community, and safety when swallowing. Factors affecting potential to achieve goals and functional outcome are  none noted today . Patient will benefit from skilled SLP services to address above impairments and improve overall function.  REHAB POTENTIAL: Good  PLAN:  SLP FREQUENCY: 2x/week  SLP DURATION: 12 weeks  PLANNED INTERVENTIONS: Environmental controls, Cueing hierachy, Cognitive reorganization, Internal/external aids, Oral motor exercises, Functional tasks, SLP instruction and feedback, Compensatory strategies, Patient/family education, and MBS    Lainy Wrobleski, CCC-SLP 02/01/2023, 1:28 PM

## 2023-02-01 NOTE — Therapy (Signed)
OUTPATIENT OCCUPATIONAL THERAPY NEURO  Treatment Note  [name] MRN: [id number] DOB:06/14/1951, 72 y.o., male Today's Date: 02/01/2023  PCP: Antony Haste REFERRING PROVIDER: Lynden Oxford      END OF SESSION:  OT End of Session - 02/01/23 1459     Visit Number 13    Number of Visits 17    Date for OT Re-Evaluation 02/07/23    Authorization Type [employer]    Progress Note Due on Visit 10    OT Start Time 1455    OT Stop Time 1535    OT Time Calculation (min) 40 min    Behavior During Therapy WFL for tasks assessed/performed                         Past Medical History:  Diagnosis Date   Diabetes mellitus without complication (HCC)    Migraine    Stroke [facility])    Past Surgical History:  Procedure Laterality Date   BACK SURGERY     GIVENS CAPSULE STUDY N/A 05/05/2015   Procedure: GIVENS CAPSULE STUDY;  Surgeon: Charna Elizabeth, MD;  Location: [facility] ENDOSCOPY;  Service: Endoscopy;  Laterality: N/A;   LOOP RECORDER INSERTION N/A 11/29/2022   Procedure: LOOP RECORDER INSERTION;  Surgeon: Maurice Small, MD;  Location: [facility] INVASIVE CV LAB;  Service: Cardiovascular;  Laterality: N/A;   Patient Active Problem List   Diagnosis Date Noted   Essential hypertension 11/30/2022   Migraine 11/30/2022   Chronic kidney disease, stage 3a (HCC) 11/30/2022   Controlled type 2 diabetes mellitus without complication, without long-term current use of insulin (HCC) 11/30/2022   HLD (hyperlipidemia) 11/30/2022   Acute CVA (cerebrovascular accident) (HCC) 11/25/2022    ONSET DATE: 11/25/22  REFERRING DIAG: R MCA CVA  THERAPY DIAG:  Muscle weakness (generalized)  Unsteadiness on feet  Other lack of coordination  Visuospatial deficit  Rationale for Evaluation and Treatment: Rehabilitation  SUBJECTIVE:   SUBJECTIVE STATEMENT: Pt reports that his children came over this weekend and grilled out for them.   Pt accompanied by: self (spouse  dropped off pt)  PERTINENT HISTORY: DM II, Neuropathy, CKD, Migraine  PRECAUTIONS: Fall  WEIGHT BEARING RESTRICTIONS: No  PAIN:  Are you having pain? Yes: NPRS scale: 4/10 Pain location: headache Pain description: shooting Aggravating factors: move eyes, turn head; riding in the car Relieving factors: rest, tylenol  FALLS: Has patient fallen in last 6 months? Yes. Number of falls 1  LIVING ENVIRONMENT: Lives with: lives with their spouse Lives in: House/apartment Has following equipment at home:  Has tons of adaptive equipment from prior family members, uses shower seat, walker, has a cane, handicapped height toilet  PLOF: Independent with basic ADLs  PATIENT GOALS: feel like myself again  OBJECTIVE:   HAND DOMINANCE: Right  ADLs: Overall ADLs: supervision Transfers/ambulation related to ADLs: walks with supervision to min assist with RW Eating: Independent Grooming: supervision UB Dressing: independent LB Dressing: supervision Toileting: supervision Bathing: supervision Tub Shower transfers: close supervision Equipment: Shower seat with back and handicapped height toilet  IADLs: Shopping: NT Light housekeeping: Not participating at this time Meal Prep: Not participating at this time Community mobility: Not driving Medication management: wife Banker: wife manages Handwriting:  NT  MOBILITY STATUS: difficulty carrying objections with ambulation  POSTURE COMMENTS:  No Significant postural limitations   ACTIVITY TOLERANCE: Activity tolerance: Patient has difficulty with fatigue - reports not sleeping well at night, feels tired during the  day  FUNCTIONAL OUTCOME MEASURES: FOTO: 63.75 01/21/23: 63  UPPER EXTREMITY ROM:    Active ROM Right eval Left eval  Shoulder flexion [facility]  Shoulder abduction THRUOUT THRUOUT  Shoulder adduction    Shoulder extension    Shoulder internal rotation    Shoulder external rotation    Elbow  flexion    Elbow extension    Wrist flexion    Wrist extension    Wrist ulnar deviation    Wrist radial deviation    Wrist pronation    Wrist supination    (Blank rows = not tested)  UPPER EXTREMITY MMT:     MMT Right eval Left eval  Shoulder flexion 4/5 4+/5  Shoulder abduction 4+/5 THRUOUT  Shoulder adduction THRUOUT   Shoulder extension    Shoulder internal rotation    Shoulder external rotation    Middle trapezius    Lower trapezius    Elbow flexion    Elbow extension    Wrist flexion    Wrist extension    Wrist ulnar deviation    Wrist radial deviation    Wrist pronation    Wrist supination    (Blank rows = not tested)  HAND FUNCTION: Grip strength: Right: 80 lbs; Left: 76 lbs and Lateral pinch: Right: 20 lbs, Left: 20 lbs  COORDINATION: Finger Nose Finger test: undershooting consistently with LUE 9 Hole Peg test: Right: 29.68 sec; Left: 36.25 sec 01/19/23: L: 34.50 sec 01/27/23: BOX and BLOCKS: R: 50 and L: 44  SENSATION: Light touch: Impaired  Stereognosis: Not tested Proprioception: WFL  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 Wife reports he was initially very confused - even hallucinating.  Improving but she reports he often is off topic in conversation  VISION: Subjective report: reports change in peripheral vision Baseline vision: Wears glasses for reading only Visual history: cataracts, retinal issues in left eye  VISION ASSESSMENT: Impaired Eye alignment: Impaired: mild Ocular ROM: impaired Tracking/Visual pursuits: Decreased smoothness with horizontal tracking and Decreased smoothness with vertical tracking Saccades: undershoots and decreased speed of saccadic movements Convergence: Impaired:   Visual Fields: Left visual field  deficits Diplopia assessment: present in Left gaze needs further testing - seems more apparent vertically.  Need to differentiate between gaze stabilization and diploia  Patient has difficulty with following activities due to following visual impairments: reading, navigating environment  PERCEPTION: Impaired: Spatial orientation: posterior bias  PRAXIS: WFL     TODAY'S TREATMENT:                                                              02/01/23 Meal prep: pt reports still not cooking anything since CVA.  Pt reports that he will prepare cereal, coffee, and sandwich but has not done any cooking.  Pt reports that he brewed the tea over the weekend with ability to fill pot, transport to burner, boil water, and open tea bags.  Spouse providing supervision for safety, due to decreased sensation and safety awareness. Tool use: pt reports utilizing wrenches to aid in removing propane from back of house.  Pt reports due to  decreased sensation and vision unable to utilize standard wrenches but utilizing adjustable wrench. Pipe tree: engaged in pattern replication with PVC pipes in standing with focus on LUE coordination, visual scanning, and sequencing.  Pt scanning horizontally to obtain items and pattern and vertically to construct PVC pattern.  Pt reports if he blinks during head turns he can adjust/refocus his vision to decrease diplopia 9 hole peg test: L: 33.38 sec and 29.85 sec = average of 31.62 sec    01/27/23 Box and blocks: L: 44 and R: 50. Pt reports double vision impacting ease with picking up blocks.   Vision: engaged placing straw into peg hole to further challenge single vision. Pt frequently compensating with head turns and closing eye.  OT set up task in vertical and horizontal plane to challenge single vision in both planes.  Pt demonstrating increased difficulty with vertical plane. Dynamic mobility: engaged in functional mobility around room to incorporate balance while picking  up cones from floor.  Pt requiring close supervision and intermittent CGA for balance, demonstrating LOB x2 requiring min assist to correct.  Pt reporting significant dizziness after bending to retreive items.  Pt reports picking items up at home, frequently stabilizing self at wall or other sturdy surface due to decreased balance. Pen tricks: instructed in rotation, flip, translation, and shift with use of pen.  OT providing demonstration and cues for quality of movement during each activity. Pt with increased difficulty with shift, but able to complete all with cues for technique and attention to quality of movement.  In-Hand Manipulation Skills Rotation:  Hold pen, try to "twirl" like a baton, keeping flat with surface of table. Try going BOTH directions 10x  Flip:  Hold pen in writing position, flip in an arch to "erase" position, then back to "write" position. Do not lift hand off table.  10x  Translation:  Open hand palm up,  put an object in your palm and then use your fingers and thumb to move it to the tips of your fingers, pinched against your thumb.  10x (bigger is easier (fat marker), smaller is harder (penny)) Shift:  Hold pen like a dart, start "shifting" it forward until you are holding it at the base, then shift it backwards until you are holding it at the tip again. 10x (like putting a key in a key hole)    01/25/23 Vision: attempted to utilize Oval chart to further address vision with focusing on alternating between near and far vision.  Pt able to read letters on chart ~ 3 M away with increased time to make each letter clear in monocular vision.  Pt requiring small chart to be 26 cm from eyes to make out letters.  Pt unable to alternate from near to far vision.  Improved ease of reading line when far chart brought closer to 5-6 feet away, however still with inability to alternate from near to far. Coordination: engaged in peg board pattern replication on vertical surface to  challenge vertical vision with pattern above peg board.  Pt demonstrating min challenges with diagonal lines and need to turn head ~90* to ensure lines in correct place.  Pt occasionally closing one eye to achieve monocular vision.   PATIENT EDUCATION: Education details: ongoing condition specific education Person educated: Patient Education method: Explanation Education comprehension: needs further education  HOME EXERCISE PROGRAM: Coordination handout  Access Code: [email or web address] URL: [email or web address] Date: 12/15/2022 Prepared by: [facility] - Outpatient  Rehab - [facility]  Exercises - Seated Horizontal  Smooth Pursuit  - 2 x daily - 7 x weekly - 1 sets - 5 reps   GOALS: Goals reviewed with patient? No  SHORT TERM GOALS: Target date: 01/07/23  Patient will complete home exercise program designed to improve coordination in left UE Goal status: MET  2.  Patient will complete an HEP designed to improve visual motor skills  Goal status: MET  3.  Patient will demonstrate improved awareness of visual management strategies for diplopia, and gaze stabilization.  Goal status: IN PROGRESS  4.  Patient will complete a shower with distant supervision  Goal status: MET - 01/10/23 reports completing Mod I  5.  Patient will reach to floor to obtain lightweight object to put on overhead shelf without loss of balance.    Goal status: IN PROGRESS   LONG TERM GOALS: Target date: 02/07/23  Patient will return to simple cooking - hot meal for he and his wife with intermittent min assistance and supervision  Goal status: IN PROGRESS  2.  Patient will read news article in increased font (14+) x 5 min with 100% comprehension   Goal status: IN PROGRESS  3.  Patient will demonstrate awareness of return to driving recommendations  Goal status: MET - reports awareness of not safe for him to drive at this time 7/82/95  4.  Patient will report effective simple hand tool  use for simple household repairs  Goal status: IN PROGRESS  5.  Patient will demonstrate at least 3 second improvement in 9 hole peg test LUE  Goal status: MET - 02/01/23  6.  Patient will safely return to simple outside maintenance - gardening, mowing with supervision  Goal status: IN PROGRESS  ASSESSMENT:  CLINICAL IMPRESSION: Pt demonstrating good ability to utilize compensatory strategies and accommodate for diplopia during tasks at table top level and in standing this session, reporting frequent blinking and refocusing. Pt tolerating dynamic standing with intermittent swaying but no LOB.  Pt demonstrating improved awareness of safety in regards to return to driving, cooking items on stove, and specific tool use.  Pt continues to be limited by diplopia and dizziness with increased mobility and head turns.  PERFORMANCE DEFICITS: in functional skills including ADLs, IADLs, coordination, sensation, strength, pain, Fine motor control, Gross motor control, mobility, balance, endurance, decreased knowledge of use of DME, vision, and UE functional use, cognitive skills including attention, energy/drive, memory, problem solving, safety awareness, and sequencing, and psychosocial skills including habits and routines and behaviors.   IMPAIRMENTS: are limiting patient from ADLs, IADLs, and leisure.   CO-MORBIDITIES: may have co-morbidities  that affects occupational performance. Patient will benefit from skilled OT to address above impairments and improve overall function.  MODIFICATION OR ASSISTANCE TO COMPLETE EVALUATION: Min-Moderate modification of tasks or assist with assess necessary to complete an evaluation.  OT OCCUPATIONAL PROFILE AND HISTORY: Detailed assessment: Review of records and additional review of physical, cognitive, psychosocial history related to current functional performance.  CLINICAL DECISION MAKING: Moderate - several treatment options, min-mod task modification  necessary  REHAB POTENTIAL: Good  EVALUATION COMPLEXITY: Moderate    PLAN:  OT FREQUENCY: 2x/week  OT DURATION: 8 weeks  PLANNED INTERVENTIONS: self care/ADL training, therapeutic exercise, therapeutic activity, neuromuscular re-education, balance training, functional mobility training, patient/family education, cognitive remediation/compensation, visual/perceptual remediation/compensation, and DME and/or AE instructions  RECOMMENDED OTHER SERVICES: Possibly Neuro-optometry  CONSULTED AND AGREED WITH PLAN OF CARE: Patient and family member/caregiver  PLAN FOR NEXT SESSION: Needs stand balance training - dynamic.  Needs env't  scanning, and gaze stabilization/ eye teaming activity.  Coordination.  May benefit from hold on therapy until further assessment/intervention by ophthalmologist.  Attempt patching at next session during reading and more dynamic mobility?   Rosalio Loud, OT 02/01/2023, 2:59 PM

## 2023-02-03 ENCOUNTER — Ambulatory Visit: Payer: Medicare Other

## 2023-02-03 ENCOUNTER — Ambulatory Visit: Payer: Medicare Other | Admitting: Occupational Therapy

## 2023-02-03 DIAGNOSIS — R471 Dysarthria and anarthria: Secondary | ICD-10-CM

## 2023-02-03 DIAGNOSIS — R41842 Visuospatial deficit: Secondary | ICD-10-CM

## 2023-02-03 DIAGNOSIS — R41841 Cognitive communication deficit: Secondary | ICD-10-CM | POA: Diagnosis not present

## 2023-02-03 DIAGNOSIS — R2681 Unsteadiness on feet: Secondary | ICD-10-CM

## 2023-02-03 DIAGNOSIS — M6281 Muscle weakness (generalized): Secondary | ICD-10-CM

## 2023-02-03 DIAGNOSIS — R278 Other lack of coordination: Secondary | ICD-10-CM

## 2023-02-03 DIAGNOSIS — R42 Dizziness and giddiness: Secondary | ICD-10-CM

## 2023-02-03 DIAGNOSIS — R208 Other disturbances of skin sensation: Secondary | ICD-10-CM

## 2023-02-03 NOTE — Therapy (Addendum)
OUTPATIENT OCCUPATIONAL THERAPY NEURO  Treatment Note  [name] MRN: [id number] DOB:[birth year], 72 y.o., male Today's Date: 02/03/2023  PCP: Antony Haste REFERRING PROVIDER: Lynden Oxford      END OF SESSION:  OT End of Session - 02/03/23 1409     Visit Number 14    Number of Visits 17    Date for OT Re-Evaluation 02/07/23    Authorization Type [employer]    Progress Note Due on Visit 10    OT Start Time 1405    OT Stop Time 1445    OT Time Calculation (min) 40 min    Behavior During Therapy WFL for tasks assessed/performed                          Past Medical History:  Diagnosis Date   Diabetes mellitus without complication (HCC)    Migraine    Stroke [facility])    Past Surgical History:  Procedure Laterality Date   BACK SURGERY     GIVENS CAPSULE STUDY N/A 05/05/2015   Procedure: GIVENS CAPSULE STUDY;  Surgeon: Charna Elizabeth, MD;  Location: [facility] ENDOSCOPY;  Service: Endoscopy;  Laterality: N/A;   LOOP RECORDER INSERTION N/A 11/29/2022   Procedure: LOOP RECORDER INSERTION;  Surgeon: Maurice Small, MD;  Location: [facility] INVASIVE CV LAB;  Service: Cardiovascular;  Laterality: N/A;   Patient Active Problem List   Diagnosis Date Noted   Essential hypertension 11/30/2022   Migraine 11/30/2022   Chronic kidney disease, stage 3a (HCC) 11/30/2022   Controlled type 2 diabetes mellitus without complication, without long-term current use of insulin (HCC) 11/30/2022   HLD (hyperlipidemia) 11/30/2022   Acute CVA (cerebrovascular accident) (HCC) 11/25/2022    ONSET DATE: 11/25/22  REFERRING DIAG: R MCA CVA  THERAPY DIAG:  Muscle weakness (generalized)  Unsteadiness on feet  Other lack of coordination  Visuospatial deficit  Other disturbances of skin sensation  Rationale for Evaluation and Treatment: Rehabilitation  SUBJECTIVE:   SUBJECTIVE STATEMENT: Pt reports that he picked up limbs and brought the trash cans down this  morning. Pt accompanied by: self (spouse dropped off pt)  PERTINENT HISTORY: DM II, Neuropathy, CKD, Migraine  PRECAUTIONS: Fall  WEIGHT BEARING RESTRICTIONS: No  PAIN:  Are you having pain? Yes: NPRS scale: 4/10 Pain location: headache Pain description: shooting Aggravating factors: move eyes, turn head; riding in the car Relieving factors: rest, tylenol  FALLS: Has patient fallen in last 6 months? Yes. Number of falls 1  LIVING ENVIRONMENT: Lives with: lives with their spouse Lives in: House/apartment Has following equipment at home:  Has tons of adaptive equipment from prior family members, uses shower seat, walker, has a cane, handicapped height toilet  PLOF: Independent with basic ADLs  PATIENT GOALS: feel like myself again  OBJECTIVE:   HAND DOMINANCE: Right  ADLs: Overall ADLs: supervision Transfers/ambulation related to ADLs: walks with supervision to min assist with RW Eating: Independent Grooming: supervision UB Dressing: independent LB Dressing: supervision Toileting: supervision Bathing: supervision Tub Shower transfers: close supervision Equipment: Shower seat with back and handicapped height toilet  IADLs: Shopping: NT Light housekeeping: Not participating at this time Meal Prep: Not participating at this time Community mobility: Not driving Medication management: wife Banker: wife manages Handwriting:  NT  MOBILITY STATUS: difficulty carrying objections with ambulation  POSTURE COMMENTS:  No Significant postural limitations   ACTIVITY TOLERANCE: Activity tolerance: Patient has difficulty with fatigue - reports not sleeping well  at night, feels tired during the day  FUNCTIONAL OUTCOME MEASURES: FOTO: 63.75 01/21/23: 63  UPPER EXTREMITY ROM:    Active ROM Right eval Left eval  Shoulder flexion [facility]  Shoulder abduction THRUOUT THRUOUT  Shoulder adduction    Shoulder extension    Shoulder internal rotation     Shoulder external rotation    Elbow flexion    Elbow extension    Wrist flexion    Wrist extension    Wrist ulnar deviation    Wrist radial deviation    Wrist pronation    Wrist supination    (Blank rows = not tested)  UPPER EXTREMITY MMT:     MMT Right eval Left eval  Shoulder flexion 4/5 4+/5  Shoulder abduction 4+/5 THRUOUT  Shoulder adduction THRUOUT   Shoulder extension    Shoulder internal rotation    Shoulder external rotation    Middle trapezius    Lower trapezius    Elbow flexion    Elbow extension    Wrist flexion    Wrist extension    Wrist ulnar deviation    Wrist radial deviation    Wrist pronation    Wrist supination    (Blank rows = not tested)  HAND FUNCTION: Grip strength: Right: 80 lbs; Left: 76 lbs and Lateral pinch: Right: 20 lbs, Left: 20 lbs  COORDINATION: Finger Nose Finger test: undershooting consistently with LUE 9 Hole Peg test: Right: 29.68 sec; Left: 36.25 sec 01/19/23: L: 34.50 sec 01/27/23: BOX and BLOCKS: R: 50 and L: 44  SENSATION: Light touch: Impaired  Stereognosis: Not tested Proprioception: WFL  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 Wife reports he was initially very confused - even hallucinating.  Improving but she reports he often is off topic in conversation  VISION: Subjective report: reports change in peripheral vision Baseline vision: Wears glasses for reading only Visual history: cataracts, retinal issues in left eye  VISION ASSESSMENT: Impaired Eye alignment: Impaired: mild Ocular ROM: impaired Tracking/Visual pursuits: Decreased smoothness with horizontal tracking and Decreased smoothness with vertical tracking Saccades: undershoots and decreased speed of saccadic movements Convergence: Impaired:    Visual Fields: Left visual field deficits Diplopia assessment: present in Left gaze needs further testing - seems more apparent vertically.  Need to differentiate between gaze stabilization and diploia  Patient has difficulty with following activities due to following visual impairments: reading, navigating environment  PERCEPTION: Impaired: Spatial orientation: posterior bias  PRAXIS: WFL     TODAY'S TREATMENT:                                                              02/03/23 Vision: Place cap on pen in midline at arms length, bringing it R and L and closer to challenge diplopia in various visual fields, completed task in vertical and horizontal manner.   When completing with LUE, undershooting pen to L by ~an inch, when completing with RUE, just barely undershooting to R.  Reiterated ophthalmologist suggestions to utilize patching with visual tasks and dynamic mobility.  Pt reports improvements for a period of time, but after ~2 hours  reports persistent diplopia. Pt reports patching seems to intensify dizziness symptoms when riding in the car. Reading: Pt read short article in 14 pt font with use of bookmark and B eyes open with frequent blinking and bringing paper alternately closer and further from self to achieve single vision.  OT provided second article in 16 pt font with pt reporting improvements in ease of reading larger font.  Reiterated use of enlarging font on computer and phone as able and use of magnifiers to aid in reading.  Pt able to recall information from each article and answer questions appropriately.      02/01/23 Meal prep: pt reports still not cooking anything since CVA.  Pt reports that he will prepare cereal, coffee, and sandwich but has not done any cooking.  Pt reports that he brewed the tea over the weekend with ability to fill pot, transport to burner, boil water, and open tea bags.  Spouse providing supervision for safety, due to decreased sensation and safety  awareness. Tool use: pt reports utilizing wrenches to aid in removing propane from back of house.  Pt reports due to decreased sensation and vision unable to utilize standard wrenches but utilizing adjustable wrench. Pipe tree: engaged in pattern replication with PVC pipes in standing with focus on LUE coordination, visual scanning, and sequencing.  Pt scanning horizontally to obtain items and pattern and vertically to construct PVC pattern.  Pt reports if he blinks during head turns he can adjust/refocus his vision to decrease diplopia 9 hole peg test: L: 33.38 sec and 29.85 sec = average of 31.62 sec    01/27/23 Box and blocks: L: 44 and R: 50. Pt reports double vision impacting ease with picking up blocks.   Vision: engaged placing straw into peg hole to further challenge single vision. Pt frequently compensating with head turns and closing eye.  OT set up task in vertical and horizontal plane to challenge single vision in both planes.  Pt demonstrating increased difficulty with vertical plane. Dynamic mobility: engaged in functional mobility around room to incorporate balance while picking up cones from floor.  Pt requiring close supervision and intermittent CGA for balance, demonstrating LOB x2 requiring min assist to correct.  Pt reporting significant dizziness after bending to retreive items.  Pt reports picking items up at home, frequently stabilizing self at wall or other sturdy surface due to decreased balance. Pen tricks: instructed in rotation, flip, translation, and shift with use of pen.  OT providing demonstration and cues for quality of movement during each activity. Pt with increased difficulty with shift, but able to complete all with cues for technique and attention to quality of movement.  In-Hand Manipulation Skills Rotation:  Hold pen, try to "twirl" like a baton, keeping flat with surface of table. Try going BOTH directions 10x  Flip:  Hold pen in writing position, flip in an  arch to "erase" position, then back to "write" position. Do not lift hand off table.  10x  Translation:  Open hand palm up,  put an object in your palm and then use your fingers and thumb to move it to the tips of your fingers, pinched against your thumb.  10x (bigger is easier (fat marker), smaller is harder (penny)) Shift:  Hold pen like a dart, start "shifting" it forward until you are holding it at the base, then shift it backwards until you are holding it at the tip again. 10x (like putting a key in a key hole)   PATIENT EDUCATION:  Education details: ongoing condition specific education Person educated: Patient Education method: Explanation Education comprehension: needs further education  HOME EXERCISE PROGRAM: Coordination handout  Access Code: [email or web address] URL: [email or web address] Date: 12/15/2022 Prepared by: [facility] - [facility]  Exercises - Seated Horizontal Smooth Pursuit  - 2 x daily - 7 x weekly - 1 sets - 5 reps   GOALS: Goals reviewed with patient? No  SHORT TERM GOALS: Target date: 01/07/23  Patient will complete home exercise program designed to improve coordination in left UE Goal status: MET  2.  Patient will complete an HEP designed to improve visual motor skills  Goal status: MET  3.  Patient will demonstrate improved awareness of visual management strategies for diplopia, and gaze stabilization.  Goal status: IN PROGRESS  4.  Patient will complete a shower with distant supervision  Goal status: MET - 01/10/23 reports completing Mod I  5.  Patient will reach to floor to obtain lightweight object to put on overhead shelf without loss of balance.    Goal status: IN PROGRESS   LONG TERM GOALS: Target date: 02/07/23  Patient will return to simple cooking - hot meal for he and his wife with intermittent min assistance and supervision  Goal status: NOT MET   2.  Patient will read news article in increased font  (14+) x 5 min with 100% comprehension   Goal status: MET - 02/03/23  3.  Patient will demonstrate awareness of return to driving recommendations  Goal status: MET - reports awareness of not safe for him to drive at this time 02/24/29  4.  Patient will report effective simple hand tool use for simple household repairs  Goal status: MET - 02/03/23  5.  Patient will demonstrate at least 3 second improvement in 9 hole peg test LUE  Goal status: MET - 02/01/23  6.  Patient will safely return to simple outside maintenance - gardening, mowing with supervision  Goal status: NOT MET  ASSESSMENT:  CLINICAL IMPRESSION: Pt reports having more better days than not, noting improvements overall in functional abilities, aside from diplopia and dizziness.  Pt demonstrating improvements in coordination of LUE, compensatory strategies to aid in reading and completion of functional tasks.  Pt demonstrating good ability to utilize compensatory strategies and accommodate for diplopia during tasks at table top level and in standing this session, utilizing frequent blinking and refocusing. Pt continues to be limited by diplopia and dizziness with increased mobility and head turns.  Pt and spouse in agreement with plan to hold OT to allow ophthalmologist to further address vision as diplopia greatly impacting return to IADLs.  Pt to request referral to return to OT after vision treatment as either ended or leveled off, if continuing to have impairments of ADLs and IADLs.   PERFORMANCE DEFICITS: in functional skills including ADLs, IADLs, coordination, sensation, strength, pain, Fine motor control, Gross motor control, mobility, balance, endurance, decreased knowledge of use of DME, vision, and UE functional use, cognitive skills including attention, energy/drive, memory, problem solving, safety awareness, and sequencing, and psychosocial skills including habits and routines and behaviors.   IMPAIRMENTS: are limiting  patient from ADLs, IADLs, and leisure.   CO-MORBIDITIES: may have co-morbidities  that affects occupational performance. Patient will benefit from skilled OT to address above impairments and improve overall function.  MODIFICATION OR ASSISTANCE TO COMPLETE EVALUATION: Min-Moderate modification of tasks or assist with assess necessary to complete an evaluation.  OT OCCUPATIONAL PROFILE AND HISTORY: Detailed assessment:  Review of records and additional review of physical, cognitive, psychosocial history related to current functional performance.  CLINICAL DECISION MAKING: Moderate - several treatment options, min-mod task modification necessary  REHAB POTENTIAL: Good  EVALUATION COMPLEXITY: Moderate    PLAN:  OT FREQUENCY: 2x/week  OT DURATION: 8 weeks  PLANNED INTERVENTIONS: self care/ADL training, therapeutic exercise, therapeutic activity, neuromuscular re-education, balance training, functional mobility training, patient/family education, cognitive remediation/compensation, visual/perceptual remediation/compensation, and DME and/or AE instructions  RECOMMENDED OTHER SERVICES: Possibly Neuro-optometry  CONSULTED AND AGREED WITH PLAN OF CARE: Patient and family member/caregiver  PLAN FOR NEXT SESSION: Needs stand balance training - dynamic.  Needs env't scanning, and gaze stabilization/ eye teaming activity.  Coordination. Hold vs d/c as pt to f/u with ophthalmologist as diplopia greatly impacting IADLs.   [place], Maralyn Sago, OT 02/03/2023, 2:09 PM   OCCUPATIONAL THERAPY DISCHARGE SUMMARY  Visits from Start of Care: 14  Current functional level related to goals / functional outcomes: Unsure as pt has not been seen since 02/03/23.  Pt had made great progress with goals meeting 4/6 and was to f/u with ophthalmologist as his diplopia was greatly impacting safety with participation in IADLs.   Remaining deficits: diplopia   Education / Equipment: HEP for coordination, education on  visual compensatory strategies, recommendation to f/u with ophthalmologist for diplopia   Patient agrees to discharge. Patient goals were partially met. Patient is being discharged due to not returning since the last visit.  Rosalio Loud, OT 07/19/23

## 2023-02-03 NOTE — Therapy (Signed)
OUTPATIENT SPEECH LANGUAGE PATHOLOGY TREATMENT   [name] MRN: [id number] DOB:16-Jul-1951, 72 y.o., male Today's Date: 02/03/2023  PCP: Antony Haste, MD REFERRING PROVIDER: [name], MD  Antony Haste MD - doc)  END OF SESSION:  End of Session - 02/03/23 2235     Visit Number 13    Number of Visits 25    Date for SLP Re-Evaluation 03/09/23    SLP Start Time 1450    SLP Stop Time  1530    SLP Time Calculation (min) 40 min    Activity Tolerance Patient tolerated treatment well                Past Medical History:  Diagnosis Date   Diabetes mellitus without complication (HCC)    Migraine    Stroke [facility])    Past Surgical History:  Procedure Laterality Date   BACK SURGERY     GIVENS CAPSULE STUDY N/A 05/05/2015   Procedure: GIVENS CAPSULE STUDY;  Surgeon: Charna Elizabeth, MD;  Location: [facility] ENDOSCOPY;  Service: Endoscopy;  Laterality: N/A;   LOOP RECORDER INSERTION N/A 11/29/2022   Procedure: LOOP RECORDER INSERTION;  Surgeon: Maurice Small, MD;  Location: [facility] INVASIVE CV LAB;  Service: Cardiovascular;  Laterality: N/A;   Patient Active Problem List   Diagnosis Date Noted   Essential hypertension 11/30/2022   Migraine 11/30/2022   Chronic kidney disease, stage 3a (HCC) 11/30/2022   Controlled type 2 diabetes mellitus without complication, without long-term current use of insulin (HCC) 11/30/2022   HLD (hyperlipidemia) 11/30/2022   Acute CVA (cerebrovascular accident) (HCC) 11/25/2022    ONSET DATE: 11/25/22   REFERRING DIAG: CVA  THERAPY DIAG:  Cognitive communication deficit  Dysarthria and anarthria  Rationale for Evaluation and Treatment: Rehabilitation  SUBJECTIVE:   SUBJECTIVE STATEMENT: "Everybody understood me great."  "It's one of the frustrating things for me right now." (Slower processing)  Pt accompanied by: self  PERTINENT HISTORY: presented 11/25/22 with AMS, blurred vision, headache, and L-sided numbness  and weakness. 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 PMH: DM, migraine  PAIN:  Are you having pain? Yes: NPRS scale: 4/10 Pain location: head Pain description: headache Aggravating factors: activity Relieving factors: rest  PATIENT GOALS: Improve communication skills  OBJECTIVE:   DIAGNOSTIC FINDINGS:  BSE 11/26/22 Clinical Impression   Pt reports no swallowing difficulties PTA. He has L side neglect that affects his ability to attend to POs presented on that side and appropriately raise them to his mouth. Oral motor exam revealed L side asymmetry and mild difficulty coodinating some movements. Pt exhibited difficulty forming a labial seal given thin liquids via straw and purees via spoon as well as oral holding during trials of solid textures. He had no observed s/s of aspiration throughout PO trials. Due to pt's awareness deficits and overall mentation, recommend Dys 2 (finely chopped) and thin liquids with full supervision. SLP will f/u as able to address potential for trials of upgraded textures as mentation improves.    PATIENT REPORTED OUTCOME MEASURES (PROM): Cognitive Function: 71/140 for higher scores indicating better cognition/better QOL relative to cognition. Score of "1" or "occurs very often" with the following areas: Indicating making simple mistakes easily, difficulty thinking of specific words, reading something several times to understand it, alternating attention, remembering someone's name, slower reaction time in conversation, more difficulty forming thoughts, slow thinking, working hard to pay attention, and trouble concentrating. With Communication Effectiveness Survey: 16/32 with higher scores  indicating better QOL/ better communication effectiveness (only "1" being having a conversation with someone across a room) .  TODAY'S TREATMENT:                                                                                                                                          DATE:  02/03/23: Pt c/o difficulty with eyesight so SLP engaged pt in conversation for therapy focus on sustained/selective attention. Pt req'd cue back to topic x3 in 30 minute conversation due to pragmatically inappropriate topic shifts. Pt was able to regain topic without difficulty but did not demo awareness of topic shift when asked.    02/01/23: Pt conversational volume WNL. Pt was around family yesterday, and cont to indicate nobody asks for repeats other than wife, rarely.  Pt demonstrated decr'd processing time/working memory (?) with practical auditory tasks such as telling total of different coin combinations, and requesting repeats for simple math word problems read aloud. Pt cont to complain of double vision which cont to make presenting pt with written tasks for attention challenging.   01/27/23: Pt reported to SLP that wife gets meds and he takes them at appropriate time. He does not do med box because he cannot open it well right now. Wife is keeping track of appointments mainly due to pt's eyesight very difficult to see the schedule printout. SLP asked pt today when he returned and he indicated the correct day after SLP oriented him to the schedule printout due to his eyesight. "It's like things cross and I can't focus," pt stated to SLP. Pt indicated he is satisfied with his speech at this time.  Pt told SLP all family birthdays correctly. "Me and [name] went over these so I could tell them to you," pt told SLP.  01/25/23: [name] told SLP that his voice is about the same volume as prior to CVA. The only person asks him to repeat is his wife "and her hearing is not that good." [name] does think it's necessary to work on the volume of his speech at this time. It is bothersome to him that he cannot recall family names as readily as before, nor can he recall birthdays. SLP suggested he write these down. Homework is to write down these names and dates  and BRING THEM next time.   5/15/24[name] enters today with fine details about the weekend without cues. Pt stated he completed "hey - ahhhh" x5 with average mid 70s dB with mod cues for louder production encouraged..Average in conversational segments of 10 minutes average mid-upper 60s. Pt stated he had not had any difficulty with people understanding him since previous session.   01/14/23: Pt expresses to SLP the details of his eye appointment yesterday without extra time necessary for recalling details. SLP inquired directly about his intelligibility after dinner and pt stated he still has more difficulty with people understanding him. Pt  is completing HEP once/day and SLP encouraged him to complete BID, and explained rationale. SLP taught pt loud /a/ today and pt average was mid 70s dB with SLP mod cues for increasing volume - noted mild vocal tremor with these productions. Pt had more success with loudness when paired with initial "HEY!". SLP told pt to complete 10 loud /a/ BID.  01/10/23: Pt volume was sub 70 dB for entire session with verbalization. Pt returned his PROMs today and scored above. He recalled he needed to turn in his PROMs and homework with extra time - presented to SLP 15 minutes into session: "I  had to have [name] read off some of the questions because they were too small. My bifocals are really hard to see with now." 100% success with homework.  Pt tells SLP he feels his speech intelligibility is much better and now the volume of his speech is the primary area making people have to ask him to repeat. Sent note home with pt to ascertain if pt's loudness was more of a deficit than pt's speech intelligibility, which has improved dramatically since initial eval.  Pt stated he hasn't coughed with meals in a while. Today in session he had multiple consecutive sips H2O without any overt s/sx difficulty.  01/05/23: Pt did not complete PROMs , nor homework due to ill yesterday. Very dizzy upon  entry to ST today due to ride to therapies today.  SLP measured pt's voice volume - average mid 60s dB. SLP inquired and pt stated wife told him today "for first time in a long time that I was mumbling." SLP educated pt about energy conservation and management; pt demonstrated understanding.  01/03/23: SLP completed CLQT, with results as above. Pt began session with 5 1/4 minute explanation about why he had experience with a cane, demonstrating possible deficit with focused or internal selective attention.  Pt's speech volume was measured as WNL today in 12 minutes of focused conversation (including initial conversation about pt's cane experiences). PT without anomia today in this session.  12/22/22: Two coughing episodes since last session - one Sunday on tea and one today with water. SLP to continue to track these. Pt without overt s/sx aspiration PNA today. SLP cont'd with pt's dysarthria HEP today and he req'd min-mod A usually for stressing/strong lingual sounds on the HEP, and for procedure. Pt stated he was performing HEP once/day.  CLQT initiated today. By end of session (15 minutes with CLQT) pt's HA increased in pain from 4/10 to 6/10. During the last 15 minutes of session (during CLQT) pt demonstrated word finding difficulty x2; Extra time (<3 seconds) necessary for pt to be successful. Prior to this, pt without difficulty in min-mod complex conversation.   12/17/22: Perform CLQT next session. Pt took medicines this morning and he did them WNL. MD visit yesterday brought new meds and deleted some others and pt to pick those up today.  SLP engaged pt in salient min-mod complex conversation and pt had 4 anomic errors which he compensated for. SLP provided him homework for word finding.  12/15/22: Pt did not bring in cough log - forgot he was supposed to do this. SLP made a rudimentary log for pt to take. SLP discussed his medication administration with him today and see pt instructions for the  result of this conversation. SLP educated how pain can interfere with rehab treatment and encouraged pt to take pain reliever if necessary prior to rehab appointments.  Pt with dysnomia (Daughter instead  of daughter in law, and carport instead of garage) which were both corrected by pt.   12/09/22 (eval): SLP went through portion of pt's HEP for oral strengthening. Full program will need to be completed with pt next time.  PATIENT EDUCATION: Education details: Eval results, HEP for lingual weakness and labial weakness Person educated: Patient and Spouse Education method: Explanation, Demonstration, Verbal cues, and Handouts Education comprehension: verbalized understanding, returned demonstration, verbal cues required, and needs further education   GOALS: Goals reviewed with patient? Yes  SHORT TERM GOALS: Target date: 01/16/23  Pt will report improved communication (speech intelligibility) after dinner between 3 sessions Baseline: 01/10/23 Goal status: Partially met  2.  Pt will bring cough log to two of first 4 sessions; pt will be referred for MBS if clinically warranted Baseline: 12/22/22 Goal status: Partially met  3.  Pt will undergo cognitive assessment in first 3 sessions Baseline:  Goal status: Met  4.  Pt speech volume in 5 minutes simple conversation will improve to WNL in 3 sessions Baseline: 01/03/23, 01/10/23 Goal status: Met   LONG TERM GOALS: Target date: 03/09/23  Pt's PROM measures will increase compared to first measurement during first therapy session Baseline:  Goal status: Ongoing  2.  Pt speech volume in 10 minutes mod complex conversation will improve to WNL in 3 sessions Baseline: 01/10/23, 01/25/23 Goal status: Met  3.  Pt will report improved communication (speech intelligibility) after dinner between 3 sessions (after 01/16/23) Baseline: 01/25/23, 02/01/23 Goal status: Ongoing  4.  Pt will report better processing speed with everyday tasks than at initial  eval Baseline: Goal status: new     ASSESSMENT:  CLINICAL IMPRESSION: Patient is a 72 y.o. male who is seen in ST for speech intelligibility and cognitive communication abilities following a CVA 11-25-22. See treatment note for more details. SLP added goal for processing speed, as OT is not addressing cognition.  OBJECTIVE IMPAIRMENTS: include attention, memory, expressive language, receptive language, and dysarthria. These impairments are limiting patient from managing medications, managing appointments, household responsibilities, ADLs/IADLs, effectively communicating at home and in community, and safety when swallowing. Factors affecting potential to achieve goals and functional outcome are  none noted today . Patient will benefit from skilled SLP services to address above impairments and improve overall function.  REHAB POTENTIAL: Good  PLAN:  SLP FREQUENCY: 2x/week  SLP DURATION: 12 weeks  PLANNED INTERVENTIONS: Environmental controls, Cueing hierachy, Cognitive reorganization, Internal/external aids, Oral motor exercises, Functional tasks, SLP instruction and feedback, Compensatory strategies, Patient/family education, and MBS    Racquel Arkin, CCC-SLP 02/03/2023, 10:36 PM

## 2023-02-03 NOTE — Therapy (Signed)
OUTPATIENT PHYSICAL THERAPY NEURO TREATMENT   [name] MRN: [id number] DOB:26-Jun-1951, 72 y.o., male Today's Date: 02/03/2023   PCP: [name], MD REFERRING PROVIDER: [name], MD >to send to PCP, [name]     END OF SESSION:  PT End of Session - 02/03/23 1537     Visit Number 14    Number of Visits 17    Date for PT Re-Evaluation 02/04/23    Authorization Type [employer] Medicare    Progress Note Due on Visit 20    PT Start Time 1530    PT Stop Time 1615    PT Time Calculation (min) 45 min    Equipment Utilized During Treatment Gait belt    Activity Tolerance Patient tolerated treatment well    Behavior During Therapy WFL for tasks assessed/performed                 Past Medical History:  Diagnosis Date   Diabetes mellitus without complication (HCC)    Migraine    Stroke [facility])    Past Surgical History:  Procedure Laterality Date   BACK SURGERY     GIVENS CAPSULE STUDY N/A 05/05/2015   Procedure: GIVENS CAPSULE STUDY;  Surgeon: Charna Elizabeth, MD;  Location: [facility] ENDOSCOPY;  Service: Endoscopy;  Laterality: N/A;   LOOP RECORDER INSERTION N/A 11/29/2022   Procedure: LOOP RECORDER INSERTION;  Surgeon: Maurice Small, MD;  Location: [facility] INVASIVE CV LAB;  Service: Cardiovascular;  Laterality: N/A;   Patient Active Problem List   Diagnosis Date Noted   Essential hypertension 11/30/2022   Migraine 11/30/2022   Chronic kidney disease, stage 3a (HCC) 11/30/2022   Controlled type 2 diabetes mellitus without complication, without long-term current use of insulin (HCC) 11/30/2022   HLD (hyperlipidemia) 11/30/2022   Acute CVA (cerebrovascular accident) (HCC) 11/25/2022    ONSET DATE: 11/25/2022  REFERRING DIAG: I63.9 (ICD-10-CM) - Cerebrovascular accident (CVA), unspecified mechanism   THERAPY DIAG:  Muscle weakness (generalized)  Unsteadiness on feet  Dizziness and giddiness  Rationale for Evaluation and Treatment:  Rehabilitation  SUBJECTIVE:                                                                                                                                                                                             SUBJECTIVE STATEMENT: Doing ok but having some increase HA symptoms today  Pt accompanied by: self  PERTINENT HISTORY: a-fib, DM, hx of migraines, hx of back surgery, multiple knee surgeries (per pt report)  PAIN:  Are you having pain? Yes: NPRS scale: 4/10 Pain location: headache Pain description: dull headache Aggravating factors:  sunlight, noise Relieving factors: darkness, quietness  PRECAUTIONS: Fall and Other: loop recorder; requires supervision at all times  WEIGHT BEARING RESTRICTIONS: No  FALLS: Has patient fallen in last 6 months? Yes. Number of falls 1 with his initial CVA  LIVING ENVIRONMENT: Lives with: lives with their spouse Lives in: House/apartment Stairs: Yes: External: 3 steps; on right going up.  Home has basement, but he doesn't go down into basement. Has following equipment at home: Single point cane, Walker - 2 wheeled, and bed side commode  PLOF: Independent.  [place], mowing.  Enjoys seeing grandson play baseball.  PATIENT GOALS: To get back to normal  OBJECTIVE:  Vitals 131/88, 78 bpm  TODAY'S TREATMENT: 02/03/23 Activity Comments  NU-step speed intervals x 8 min   Balance  -Tandem: EO/EC 15 -Heel-toe raise 2x10 -standing on foam: EO/EC 30 sec -retrowalking Suitcase carry                  TODAY'S TREATMENT: 02/01/23 Activity Comments  Review addition to HEP:  backwards walking x tandem walk x sitting horizontal VOR 30" Standing exercises performed at II bars with CGA. Small and slow head movements with VOR- c/o moderate increase in dizziness   STS + gaze on targets 5x CGA-min A d/t imbalance   figure 8 turns around hula hoops, then cones  Narrow BOS, walking slowly and gingerly;  standing rest breaks in between to prevent excessive dizziness  walking around gym, picking up cones for habituation and balance challenge  Poor safety awareness when bending to pick items off the ground; required CGA for safety  in II bars: Sidestepping x1 min romberg + head turns/nods 10x each 1/4 and 1/2 turns to targets Moderate sway with head movements. Mild-mod sway with turns, requiring occasional hand support                 Access Code: [email or web address] URL: [email or web address] Date: 01/25/2023 Prepared by: [facility] - Outpatient  Rehab - [facility]  Exercises - Seated March  - 1 x daily - 5 x weekly - 3 sets - 10 reps - Seated Long Arc Quad  - 1 x daily - 5 x weekly - 3 sets - 10 reps - Pencil Pushups  - 2 x daily - 7 x weekly - 1 sets - 5 reps - Sit to Stand with Counter Support  - 1 x daily - 5 x weekly - 2 sets - 10 reps - Seated Left Head Turns Vestibular Habituation  - 1 x daily - 5 x weekly - 2-3 sets - 30 sec hold - Seated Head Nod  - 1 x daily - 5 x weekly - 2-3 sets - 30 sec hold - Backward Walking with Counter Support  - 1 x daily - 5 x weekly - 1 sets - 5 reps - Tandem Walking with Counter Support  - 1 x daily - 7 x weekly - 3 sets - 10 reps - Seated Gaze Stabilization with Head Rotation  - 1 x daily - 7 x weekly - 3-5 sets - 15-30 sec hold       Below measures were taken at time of initial evaluation unless otherwise specified:    VESTIBULAR ASSESSMENT   GENERAL OBSERVATION: No acute distress    SYMPTOM BEHAVIOR:   Subjective history: Recent hx of CVA and since then he feels like he is being pulled backward or to the side.  Wife (at eval) notes  veering with gait.   Non-Vestibular symptoms: diplopia, migraine symptoms, and CVA 11/25/22   Type of dizziness: Imbalance (Disequilibrium) and Unsteady with head/body turns.  Feels like he is going backwards   Frequency: throughout the day   Duration: with movement   Aggravating factors:  Induced by position change: sit to stand and Induced by motion: occur when walking   Relieving factors: slow movements   Progression of symptoms: unchanged   OCULOMOTOR EXAM:   Ocular Alignment: abnormal and L eye slightly lower   Ocular ROM: No Limitations and double vision with upper motion   Spontaneous Nystagmus: absent   Gaze-Induced Nystagmus: absent   Smooth Pursuits: intact   Saccades: slow, diplopia in upward vertical direction   Convergence/Divergence: 30 inches, then 19 inches (notes blurry vision farther away)    VESTIBULAR - OCULAR REFLEX:    Slow VOR: Comment: double vision horizontal and vertical, rates dizziness as 4/10   VOR Cancellation: Comment: reports dizziness 8/10   Head-Impulse Test: NT          -------------------------------------------------------------- Objective measures below taken at the initial evaluation:  DIAGNOSTIC FINDINGS: 1. 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 2. Underlying mild age-related cerebral atrophy with moderate chronic microvascular ischemic disease.  COGNITION: Overall cognitive status: Impaired   SENSATION: Light touch: WFL and reports sometimes having tingling on left foot  COORDINATION: Decreased LLE  MUSCLE TONE: WFL BLEs  VITALS:  137/85, HR 71  POSTURE: rounded shoulders and posterior pelvic tilt  LOWER EXTREMITY ROM:   in sitting, Active ROM WFL BLEs   LOWER EXTREMITY MMT:    MMT Right Eval Left Eval  Hip flexion 4+ 4  Hip extension    Hip abduction    Hip adduction    Hip internal rotation    Hip external rotation    Knee flexion 4 3+  Knee extension 4 3+  Ankle dorsiflexion 4 3+  Ankle plantarflexion    Ankle inversion    Ankle eversion    (Blank rows = not tested)   TRANSFERS: Assistive device utilized: None  Sit to stand: CGA Stand to sit: CGA Slowed, ataxic movements with transfers GAIT: Gait pattern: step through pattern, shuffling, ataxic, and  wide BOS; veers to L Distance walked: 60 ft Assistive device utilized: Environmental consultant - 2 wheeled Level of assistance: CGA Comments: Tends to vent to L  FUNCTIONAL TESTS:  5 times sit to stand: 25.34 sec without UE support 10 meter walk test: NT Berg Balance Scale: 22/56     PATIENT SURVEYS:  FOTO Intake score 50; predicted 69    GOALS: Goals reviewed with patient? Yes  SHORT TERM GOALS: Target date: 01/07/2023  Pt will be supervision with HEP for improved balance, strength, gait. Baseline: Goal status: IN PROGRESS  2.  Pt will improve 5x sit<>stand to less than or equal to 20 sec to demonstrate improved functional strength and transfer efficiency.  Baseline: 25.34 sec Goal status: IN PROGRESS  3.  Pt will improve Berg score to at least 30/56 to decrease fall risk. Baseline: 22/56>34/56 Goal status: MET  4.  Further vestibular testing to be completed and goal set as appropriate. Baseline:  Goal status: IN PROGRESS   LONG TERM GOALS: Target date: 02/04/2023  Pt will be supervision with HEP for improved strength, balance, gait. Baseline:  Goal status: IN PROGRESS  2.  Pt will improve 5x sit<>stand to less than or equal to 15 sec to demonstrate improved functional strength and transfer  efficiency. Baseline: 25.34 sec Goal status: IN PROGRESS  3.  Pt will improve Berg score to at least 40/56 to decrease fall risk. Baseline: 22/56 Goal status: IN PROGRESS  4.  FOTO score to improve to 69 to demo improved overall functional mobility. Baseline: 50 Goal status: IN PROGRESS  5.  Further vestibular testing to be completed/goal written as appropriate. Baseline:  Goal status: IN PROGRESS  6.  Pt will ambulate at least 1000 ft indoor and outdoor surfaces, with appropriate assistive device, mod I, for outdoor and community gait. Baseline:  Goal status: IN PROGRESS  ASSESSMENT:  CLINICAL IMPRESSION: Initiated with high intensity intervals for cardiovascular training and  demands and for benefit of rapid, alternating coordination. Continued with focus on improved balance and postural stability under static and dynamic demands.  Demonstrating good compensation with his impaired vision with most difficulty exhibited against external perturbations and narrow BOS.  Continued sessions would be of benefit to improve mobility and reduce risk for falls.  OBJECTIVE IMPAIRMENTS: Abnormal gait, decreased balance, decreased knowledge of use of DME, decreased mobility, difficulty walking, decreased ROM, decreased strength, decreased safety awareness, dizziness, and postural dysfunction.   ACTIVITY LIMITATIONS: carrying, lifting, bending, standing, squatting, transfers, bathing, toileting, dressing, and locomotion level  PARTICIPATION LIMITATIONS: meal prep, cleaning, laundry, community activity, and yard work  PERSONAL FACTORS: 3+ comorbidities: see PMH; also potential visual/ vestibular component to pt's balance that needs to be further assessed  are also affecting patient's functional outcome.   REHAB POTENTIAL: Good  CLINICAL DECISION MAKING: Evolving/moderate complexity  EVALUATION COMPLEXITY: Moderate  PLAN:  PT FREQUENCY: 2x/week  PT DURATION: 8 weeks plus eval  PLANNED INTERVENTIONS: Therapeutic exercises, Therapeutic activity, Neuromuscular re-education, Balance training, Gait training, Patient/Family education, Self Care, Vestibular training, Canalith repositioning, and DME instructions  PLAN FOR NEXT SESSION:  continue tandem stance, SLS, Romberg.  Progress balance and strength, compliant surfaces; continue working on convergence, VOR, habituation in functional activities, avoiding obstacles L side  5:13 PM, 02/03/23 M. Shary Decamp, PT, DPT Physical Therapist- [place] Office Number: [phone]

## 2023-02-04 NOTE — Progress Notes (Signed)
[facility] Summary Report / Loop Recorder 

## 2023-02-07 ENCOUNTER — Ambulatory Visit (INDEPENDENT_AMBULATORY_CARE_PROVIDER_SITE_OTHER): Payer: Medicare Other

## 2023-02-07 DIAGNOSIS — I639 Cerebral infarction, unspecified: Secondary | ICD-10-CM | POA: Diagnosis not present

## 2023-02-07 LAB — CUP PACEART REMOTE DEVICE CHECK
Date Time Interrogation Session: 20240602230918
Implantable Pulse Generator Implant Date: 20240325

## 2023-02-21 ENCOUNTER — Telehealth: Payer: Self-pay

## 2023-02-21 NOTE — Telephone Encounter (Signed)
Mri results faxed as requested by dr. Teresa Coombs

## 2023-03-02 NOTE — Progress Notes (Signed)
[facility] Summary Report / Loop Recorder 

## 2023-03-14 ENCOUNTER — Ambulatory Visit (INDEPENDENT_AMBULATORY_CARE_PROVIDER_SITE_OTHER): Payer: Medicare Other

## 2023-03-14 DIAGNOSIS — I639 Cerebral infarction, unspecified: Secondary | ICD-10-CM

## 2023-03-14 LAB — CUP PACEART REMOTE DEVICE CHECK
Date Time Interrogation Session: 20240705230258
Implantable Pulse Generator Implant Date: 20240325

## 2023-03-30 ENCOUNTER — Other Ambulatory Visit: Payer: Self-pay | Admitting: Neurology

## 2023-03-31 NOTE — Progress Notes (Signed)
[facility] Summary Report / Loop Recorder 

## 2023-04-18 ENCOUNTER — Ambulatory Visit (INDEPENDENT_AMBULATORY_CARE_PROVIDER_SITE_OTHER): Payer: Medicare Other

## 2023-04-18 DIAGNOSIS — I639 Cerebral infarction, unspecified: Secondary | ICD-10-CM | POA: Diagnosis not present

## 2023-05-03 NOTE — Progress Notes (Signed)
[facility] Summary Report / Loop Recorder 

## 2023-05-19 NOTE — Therapy (Signed)
[facility] [address] [place], [place], [address] Phone: [phone]   Fax:  [phone]  Patient Details  Name: [name] MRN: [id number] Date of Birth: 25-Apr-1951 Referring Provider:  Antony Haste, MD  Encounter Date: 05/19/2023  SPEECH THERAPY DISCHARGE SUMMARY  Visits from Start of Care: 13  Current functional level related to goals / functional outcomes: Pt did not return after visit on 02/03/23; He did not schedule more visits. He will formally be discharged at this time. Goals and impression from that visit are below: SHORT TERM GOALS: Target date: 01/16/23   Pt will report improved communication (speech intelligibility) after dinner between 3 sessions Baseline: 01/10/23 Goal status: Partially met   2.  Pt will bring cough log to two of first 4 sessions; pt will be referred for MBS if clinically warranted Baseline: 12/22/22 Goal status: Partially met   3.  Pt will undergo cognitive assessment in first 3 sessions Baseline:  Goal status: Met   4.  Pt speech volume in 5 minutes simple conversation will improve to WNL in 3 sessions Baseline: 01/03/23, 01/10/23 Goal status: Met     LONG TERM GOALS: Target date: 03/09/23   Pt's PROM measures will increase compared to first measurement during first therapy session Baseline:  Goal status: Ongoing   2.  Pt speech volume in 10 minutes mod complex conversation will improve to WNL in 3 sessions Baseline: 01/10/23, 01/25/23 Goal status: Met   3.  Pt will report improved communication (speech intelligibility) after dinner between 3 sessions (after 01/16/23) Baseline: 01/25/23, 02/01/23 Goal status: Ongoing   4.  Pt will report better processing speed with everyday tasks than at initial eval Baseline: Goal status: new        ASSESSMENT:   CLINICAL IMPRESSION: Patient is a 72 y.o. male who is seen in ST for speech intelligibility and cognitive communication  abilities following a CVA 11-25-22. See treatment note for more details. SLP added goal for processing speed, as OT is not addressing cognition.   Remaining deficits: Unknown, as this was an unexpected d/c.   Education / Equipment: See therapy notes.   Patient agrees to discharge. Patient goals were partially met. Patient is being discharged due to not returning since the last visit.Marland Kitchen    Chonte Ricke, CCC-SLP 05/19/2023, 10:57 AM  [facility] [address], [address] [place], [place], [address] Phone: [phone]   Fax:  [phone]

## 2023-05-22 LAB — CUP PACEART REMOTE DEVICE CHECK
Date Time Interrogation Session: 20240911012011
Implantable Pulse Generator Implant Date: 20240325

## 2023-05-23 ENCOUNTER — Ambulatory Visit (INDEPENDENT_AMBULATORY_CARE_PROVIDER_SITE_OTHER): Payer: Medicare Other

## 2023-05-23 DIAGNOSIS — I639 Cerebral infarction, unspecified: Secondary | ICD-10-CM

## 2023-06-08 NOTE — Progress Notes (Signed)
[facility] Summary Report / Loop Recorder 

## 2023-06-14 ENCOUNTER — Ambulatory Visit: Payer: Medicare Other | Admitting: Neurology

## 2023-06-14 ENCOUNTER — Encounter: Payer: Self-pay | Admitting: Neurology

## 2023-06-14 VITALS — Resp 17 | Ht 66.5 in | Wt 152.0 lb

## 2023-06-14 DIAGNOSIS — G43709 Chronic migraine without aura, not intractable, without status migrainosus: Secondary | ICD-10-CM

## 2023-06-14 DIAGNOSIS — F32A Depression, unspecified: Secondary | ICD-10-CM | POA: Diagnosis not present

## 2023-06-14 DIAGNOSIS — I951 Orthostatic hypotension: Secondary | ICD-10-CM

## 2023-06-14 DIAGNOSIS — R519 Headache, unspecified: Secondary | ICD-10-CM

## 2023-06-14 MED ORDER — CITALOPRAM HYDROBROMIDE 20 MG PO TABS: 20.0000 mg | ORAL_TABLET | Freq: Every day | ORAL | 0 refills | Status: DC

## 2023-06-14 NOTE — Patient Instructions (Addendum)
Continue current medications Will obtain ESR CRP to rule out giant cell arteritis, I will contact you to go over the results If workup for giant cell arteritis is negative, we will treat this headache as migraine, would likely increase the amitriptyline Trial of Celexa for the next 90 days Increase water intake, can add electrolyte pack in your water (sugar free)  Continue follow-up PCP Return in 6 months or sooner if worse.

## 2023-06-14 NOTE — Progress Notes (Addendum)
[facility]  PATIENT: [name] DOB: 1951/02/26  REQUESTING CLINICIAN: [name], MD HISTORY FROM: Patient and spouse REASON FOR VISIT: Right MCA strokes    HISTORICAL  CHIEF COMPLAINT:  Chief Complaint  Patient presents with   Migraine    Rm12, wife present, Migraine: 3/30 days migraine, Has: 30/30 days, triggers: light, sound, standing to fast causes dizziness and headache ORTHOSTATIC BP COMPLETED. After riding in car or driving car causes the migraines/ha's. CVA followup: using cane for right sided weakness. Pt feels like he is unable to do a whole lot because he stays so fatigued.     INTERVAL HISTORY 06/14/2023:  Patient presents today for follow-up, he is accompanied by wife.  Last visit was in May, since then he is complaining of new headache with double vision.  Headache is mostly on the left temple, described as sharp pain, sometimes affecting behind the left eye.  He also reports some tenderness around in the left temple. Patient is also reporting dizziness upon standing, that he described as swimmy headed, no room spinning sensation.  He uses a cane with ambulation.  Wife tells me that he sleeps a lot, does not like to do any activities, does not like to walk or exercise.  He reports that his neuropathy is better, currently he is on gabapentin 300 mg 3 times daily, for his headaches, he is also on amitriptyline and sumatriptan.  States the amitriptyline does help but the headaches are still present.   HISTORY OF PRESENT ILLNESS:  This is a 72 year old man with history of migraines headaches, diabetes, and CKDs who is presenting after being admitted for acute right hemispheric stroke. Patient initially presented to the ED with complains of headaches, reports that nothing was right, he was throwing up, confused and also had left side weakness and could not walk.  In the ED, he was found to have a right MCA stroke, cortical strokes. Etiology  likely embolic, his MRA did not show any large vessel occlusion and his EF was normal 60 to 65%. He was recommended DAPT for a total of 21 days and then aspirin thereafter. He was also recommended outpatient rehab. He was also recommended a loop recorder.  Since discharge from hospital he continues to improve physically with the help of rehab   Hospital course and summary  72 year old man with PMH of CKD 3A, type II DM, migraine, present to the hospital with complaints of left-sided weakness and dizziness.  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  Neurology was consulted.  Stroke workup initiated. Assessment and Plan  Acute right MCA stroke. Possible small vessel disease. CT head without any acute abnormality. MRI confirms acute right posterior MCA infarct. MRA shows no large vessel occlusion. Echocardiogram shows preserved EF of 60-65% without any valvular abnormality. Lower extremity Doppler negative for DVT. Carotid Doppler negative for any significant stenosis. PT OT as well as SLP evaluation recommending CIR. on repeat evaluation patient improved significantly and therefore therapy recommended possibility of going home.  Patient will continue with outpatient neurorehab for SLP. Currently on dysphagia 2 diet. Not on any thrombophlebitic medication prior to admission.  Currently on 81 mg aspirin and 75 mg Plavix daily.  Continue for 3 weeks and then aspirin alone. Patient will require loop recorder prior to discharge.   OTHER MEDICAL CONDITIONS: CKD 3, Diabetes, Hyperlipidemia,    REVIEW OF SYSTEMS: Full 14 system review of systems performed and negative with exception of: As  noted in the HPI   ALLERGIES: Allergies  Allergen Reactions   Atorvastatin Calcium Other (See Comments)   Lisinopril Swelling   Bee Venom Swelling    Swelling   Swelling   Codeine Itching    HOME MEDICATIONS: Outpatient Medications Prior to Visit  Medication Sig Dispense Refill   acetaminophen (TYLENOL)  325 MG tablet Take 650 mg by mouth every 6 (six) hours as needed for moderate pain or mild pain.     amitriptyline (ELAVIL) 10 MG tablet TAKE 1 TABLET (10 MG TOTAL) BY MOUTH AT BEDTIME FOR 90 DOSES. 90 tablet 0   aspirin EC 81 MG tablet Take 1 tablet (81 mg total) by mouth daily. Swallow whole. 30 tablet 12   dicyclomine (BENTYL) 20 MG tablet Take 20 mg by mouth 3 (three) times daily before meals.     docusate sodium (COLACE) 100 MG capsule Take 1 capsule (100 mg total) by mouth 2 (two) times daily. 10 capsule 0   fluticasone (FLONASE) 50 MCG/ACT nasal spray Place 2 sprays into both nostrils 2 (two) times daily.     gabapentin (NEURONTIN) 300 MG capsule Take 300 mg by mouth 3 (three) times daily.     metFORMIN (GLUCOPHAGE) 500 MG tablet Take 1,000 mg by mouth 2 (two) times daily with a meal.     ondansetron (ZOFRAN ODT) 4 MG disintegrating tablet Take 1 tablet (4 mg total) by mouth every 8 (eight) hours as needed for nausea or vomiting. 6 tablet 0   rosuvastatin (CRESTOR) 20 MG tablet Take 1 tablet (20 mg total) by mouth daily. 30 tablet 0   SUMAtriptan (IMITREX) 100 MG tablet Take 1 tablet by mouth daily as needed for headache or migraine.     tamsulosin (FLOMAX) 0.4 MG CAPS capsule Take by mouth daily.     pantoprazole (PROTONIX) 40 MG tablet Take 1 tablet (40 mg total) by mouth daily. 30 tablet 0   No facility-administered medications prior to visit.    PAST MEDICAL HISTORY: Past Medical History:  Diagnosis Date   Diabetes mellitus without complication (HCC)    Migraine    Stroke (HCC)     PAST SURGICAL HISTORY: Past Surgical History:  Procedure Laterality Date   BACK SURGERY     GIVENS CAPSULE STUDY N/A 05/05/2015   Procedure: GIVENS CAPSULE STUDY;  Surgeon: Charna Elizabeth, MD;  Location: [facility] ENDOSCOPY;  Service: Endoscopy;  Laterality: N/A;   LOOP RECORDER INSERTION N/A 11/29/2022   Procedure: LOOP RECORDER INSERTION;  Surgeon: Maurice Small, MD;  Location: [facility] INVASIVE CV LAB;   Service: Cardiovascular;  Laterality: N/A;    FAMILY HISTORY: Family History  Problem Relation Age of Onset   Esophageal cancer Mother    Stomach cancer Father    Colon cancer Father    Pancreatic cancer Sister    Arthritis Sister    Heart disease Sister     SOCIAL HISTORY: Social History   Socioeconomic History   Marital status: Married    Spouse name: Not on file   Number of children: Not on file   Years of education: Not on file   Highest education level: Not on file  Occupational History   Not on file  Tobacco Use   Smoking status: Never   Smokeless tobacco: Never  Substance and Sexual Activity   Alcohol use: No   Drug use: No   Sexual activity: Not on file  Other Topics Concern   Not on file  Social History Narrative   Lives  at home   Caffeine- 8-16oz   Right handed   Social Determinants of Health   Financial Resource Strain: Low Risk  (04/18/2023)   Received [facility]   Overall Financial Resource Strain (CARDIA)    Difficulty of Paying Living Expenses: Not hard at all  Food Insecurity: No Food Insecurity (04/18/2023)   Received from [facility]   Hunger Vital Sign    Worried About Running Out of Food in the Last Year: Never true    Ran Out of Food in the Last Year: Never true  Transportation Needs: No Transportation Needs (04/18/2023)   Received [facility] - Transportation    Lack of Transportation (Medical): No    Lack of Transportation (Non-Medical): No  Physical Activity: Inactive (04/18/2023)   Received [facility]   Exercise Vital Sign    Days of Exercise per Week: 0 days    Minutes of Exercise per Session: 150+ min  Stress: No Stress Concern Present (04/18/2023)   Received from [facility] - Occupational Stress Questionnaire    Feeling of Stress : Not at all  Social Connections: Moderately Integrated (04/18/2023)   Received from [facility]   Social Network     How would you rate your social network (family, work, friends)?: Adequate participation with social networks  Intimate Partner Violence: Not At Risk (04/18/2023)   Received from [facility]   HITS    Over the last 12 months how often did your partner physically hurt you?: 1    Over the last 12 months how often did your partner insult you or talk down to you?: 1    Over the last 12 months how often did your partner threaten you with physical harm?: 1    Over the last 12 months how often did your partner scream or curse at you?: 1    PHYSICAL EXAM  GENERAL EXAM/CONSTITUTIONAL: Vitals:  Vitals:   06/14/23 1131  Resp: 17  Weight: 152 lb (68.9 kg)  Height: 5' 6.5" (1.689 m)   Body mass index is 24.17 kg/m. Wt Readings from Last 3 Encounters:  06/14/23 152 lb (68.9 kg)  01/05/23 163 lb (73.9 kg)  11/28/22 156 lb 15.5 oz (71.2 kg)   Patient is in no distress; well developed, nourished and groomed; neck is supple  MUSCULOSKELETAL: Gait, strength, tone, movements noted in Neurologic exam below  NEUROLOGIC: MENTAL STATUS:      No data to display         awake, alert, oriented to person, place and time recent and remote memory intact normal attention and concentration language fluent, comprehension intact, naming intact fund of knowledge appropriate  CRANIAL NERVE:  2nd, 3rd, 4th, 6th - Visual fields full to confrontation, extraocular muscles intact, no nystagmus 5th - facial sensation symmetric 7th - facial strength symmetric 8th - hearing intact 9th - palate elevates symmetrically, uvula midline 11th - shoulder shrug symmetric 12th - tongue protrusion midline Decrease facial expressions  MOTOR:  normal bulk and tone, full strength in the BUE, BLE. He does have bradykinesia but no rigidity and no tremors   SENSORY:  normal and symmetric to light touch  COORDINATION:  finger-nose-finger, fine finger movements normal  REFLEXES:  deep tendon reflexes present and  symmetric  GAIT/STATION:  Walks with a cane, positive Romberg and positive pull test    DIAGNOSTIC DATA (LABS, IMAGING, TESTING) - I reviewed patient records, labs, notes, testing and  imaging myself where available.  Lab Results  Component Value Date   WBC 5.0 11/29/2022   HGB 12.4 (L) 11/29/2022   HCT 36.8 (L) 11/29/2022   MCV 90.9 11/29/2022   PLT 271 11/29/2022      Component Value Date/Time   NA 137 11/29/2022 0734   K 3.9 11/29/2022 0734   CL 107 11/29/2022 0734   CO2 21 (L) 11/29/2022 0734   GLUCOSE 114 (H) 11/29/2022 0734   BUN 14 11/29/2022 0734   CREATININE 1.81 (H) 11/29/2022 0734   CALCIUM 9.4 11/29/2022 0734   PROT 6.9 11/25/2022 1130   ALBUMIN 3.8 11/25/2022 1130   AST 21 11/25/2022 1130   ALT 14 11/25/2022 1130   ALKPHOS 52 11/25/2022 1130   BILITOT 1.2 11/25/2022 1130   GFRNONAA 39 (L) 11/29/2022 0734   GFRAA >60 11/20/2016 2235   Lab Results  Component Value Date   CHOL 95 11/26/2022   HDL 34 (L) 11/26/2022   LDLCALC 35 11/26/2022   TRIG 132 11/26/2022   CHOLHDL 2.8 11/26/2022   Lab Results  Component Value Date   HGBA1C 6.7 (H) 11/26/2022   Lab Results  Component Value Date   VITAMINB12 251 11/12/2008   Lab Results  Component Value Date   TSH 0.628 Test methodology is 3rd generation TSH 11/12/2008    MRI Brain 01/06/2023 1. 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 2. Underlying mild age-related cerebral atrophy with moderate chronic microvascular ischemic disease  MRA Head 11/25/2022 1. Negative intracranial MRA for large vessel occlusion or other emergent finding. No hemodynamically significant or correctable stenosis. 2. Dolichoectatic appearance of the intracranial arterial circulation, suggesting chronic underlying hypertension.   Echocardiogram 11/26/2022 1. Limited study to 35 images - exam stopped due to patient non-compliance  2. Left ventricular ejection fraction, by estimation, is 60 to 65%.  The left ventricle has normal function. The left ventricle has no regional wall motion abnormalities. Left ventricular diastolic parameters are consistent with Grade I diastolic dysfunction (impaired relaxation).  3. The aortic valve is tricuspid. Aortic valve regurgitation is not visualized.  4. The mitral valve is grossly normal. No evidence of mitral valve regurgitation.  5. Right ventricular systolic function was not well visualized. The right ventricular size is not well visualized.     ASSESSMENT AND PLAN  72 y.o. year old male with vascular risk factors including diabetes, hyperlipidemia, who is presenting for follow-up for his right MCA stroke.  He is currently on aspirin and statin, compliant with the medications.   He is also complaining of new headache; patient is on Sumatriptan and amitriptyline, reports improvement of the headache but they are still present.  On top of this, he is also complaining of left-sided headache, around the temple affecting the left eye with blurry vision.  Based on location, I would like to rule out giant cell arteritis, will obtain ESR and CRP and if elevated will treat accordingly and obtain a biopsy but if normal then we will treat the headaches as migraines.  These plans were discussed with patient and wife and they are comfortable to plans. In terms of the bradykinesia, masked facies, he does not have rigidity, does not have tremor and does not have stoop posture or Parkinsonian gait.  Will continue to monitor his symptoms.   When it comes to the dizziness upon standing, on exam, he was found to be orthostatic, advised him to increase his water intake and to add electrolyte packet in his water. (  Sugar free since he has diabetes)  Wife also reports some depressed mood, anhedonia, will give the patient a trial of Celexa.  If they feel this medication is helpful and would like to continue will prescribe it.  Continue to follow with PCP and return in 6 months or  sooner if worse.    1. Chronic migraine w/o aura w/o status migrainosus, not intractable   2. Nonintractable headache, unspecified chronicity pattern, unspecified headache type   3. Depression, unspecified depression type   4. Orthostatic hypotension      Patient Instructions  Continue current medications Will obtain ESR CRP to rule out giant cell arteritis, I will contact you to go over the results If workup for giant cell arteritis is negative, we will treat this headache as migraine, would likely increase the amitriptyline Trial of Celexa for the next 90 days Increase water intake, can add electrolyte pack in your water (sugar free)  Continue follow-up PCP Return in 6 months or sooner if worse.  Orders Placed This Encounter  Procedures   Sedimentation Rate   C-reactive Protein    Meds ordered this encounter  Medications   citalopram (CELEXA) 20 MG tablet    Sig: Take 1 tablet (20 mg total) by mouth daily.    Dispense:  90 tablet    Refill:  0    Return in about 6 months (around 12/13/2023).  I have spent a total of 50 minutes dedicated to this patient today, preparing to see patient, performing a medically appropriate examination and evaluation, ordering tests and/or medications and procedures, and counseling and educating the patient/family/caregiver; independently interpreting result and communicating results to the family/patient/caregiver; and documenting clinical information in the electronic medical record.   Windell Norfolk, MD 06/14/2023, 2:30 PM  [facility] [address] [place], [place] [address] [phone]

## 2023-06-15 ENCOUNTER — Other Ambulatory Visit: Payer: Self-pay | Admitting: Neurology

## 2023-06-15 ENCOUNTER — Telehealth: Payer: Self-pay

## 2023-06-15 LAB — SEDIMENTATION RATE: Sed Rate: 3 mm/h (ref 0–30)

## 2023-06-15 LAB — C-REACTIVE PROTEIN: CRP: <1 mg/L (ref 0–10)

## 2023-06-15 MED ORDER — AMITRIPTYLINE HCL 25 MG PO TABS: 25.0000 mg | ORAL_TABLET | Freq: Every day | ORAL | 2 refills | Status: DC

## 2023-06-15 NOTE — Telephone Encounter (Signed)
Called to relay results of Lab work and the patient was agreeable to plan.

## 2023-06-15 NOTE — Progress Notes (Signed)
Please call and inform patient that his inflammatory markers are normal. We will treat the headaches as migraines. Please have patient increase amitriptyline to 25 mg nightly. I will send a new prescription.  Please keep any upcoming appointments or tests and call us with any interim questions, concerns, problems or updates. Thanks,   Windell Norfolk, MD

## 2023-06-27 ENCOUNTER — Ambulatory Visit: Payer: Medicare Other

## 2023-06-27 DIAGNOSIS — I639 Cerebral infarction, unspecified: Secondary | ICD-10-CM

## 2023-06-28 LAB — CUP PACEART REMOTE DEVICE CHECK
Date Time Interrogation Session: 20241020231813
Implantable Pulse Generator Implant Date: 20240325

## 2023-07-03 ENCOUNTER — Other Ambulatory Visit: Payer: Self-pay | Admitting: Neurology

## 2023-07-14 NOTE — Progress Notes (Signed)
[facility] Summary Report / Loop Recorder 

## 2023-08-01 ENCOUNTER — Ambulatory Visit (INDEPENDENT_AMBULATORY_CARE_PROVIDER_SITE_OTHER): Payer: Medicare Other

## 2023-08-01 DIAGNOSIS — I639 Cerebral infarction, unspecified: Secondary | ICD-10-CM | POA: Diagnosis not present

## 2023-08-01 LAB — CUP PACEART REMOTE DEVICE CHECK
Date Time Interrogation Session: 20241122230307
Implantable Pulse Generator Implant Date: 20240325

## 2023-08-28 ENCOUNTER — Emergency Department (HOSPITAL_BASED_OUTPATIENT_CLINIC_OR_DEPARTMENT_OTHER)
Admission: EM | Admit: 2023-08-28 | Discharge: 2023-08-28 | Disposition: A | Discharge status: Home / Self Care | Payer: Medicare Other | Attending: Emergency Medicine | Admitting: Emergency Medicine

## 2023-08-28 ENCOUNTER — Encounter (HOSPITAL_BASED_OUTPATIENT_CLINIC_OR_DEPARTMENT_OTHER): Payer: Self-pay | Admitting: Emergency Medicine

## 2023-08-28 ENCOUNTER — Emergency Department (HOSPITAL_BASED_OUTPATIENT_CLINIC_OR_DEPARTMENT_OTHER): Payer: Medicare Other

## 2023-08-28 ENCOUNTER — Emergency Department (HOSPITAL_COMMUNITY): Payer: Medicare Other

## 2023-08-28 ENCOUNTER — Other Ambulatory Visit: Payer: Self-pay

## 2023-08-28 DIAGNOSIS — R531 Weakness: Secondary | ICD-10-CM | POA: Insufficient documentation

## 2023-08-28 DIAGNOSIS — R519 Headache, unspecified: Secondary | ICD-10-CM | POA: Diagnosis not present

## 2023-08-28 DIAGNOSIS — I6782 Cerebral ischemia: Secondary | ICD-10-CM | POA: Diagnosis not present

## 2023-08-28 DIAGNOSIS — R200 Anesthesia of skin: Secondary | ICD-10-CM | POA: Insufficient documentation

## 2023-08-28 DIAGNOSIS — I672 Cerebral atherosclerosis: Secondary | ICD-10-CM | POA: Insufficient documentation

## 2023-08-28 DIAGNOSIS — R2990 Unspecified symptoms and signs involving the nervous system: Secondary | ICD-10-CM

## 2023-08-28 DIAGNOSIS — J3489 Other specified disorders of nose and nasal sinuses: Secondary | ICD-10-CM | POA: Insufficient documentation

## 2023-08-28 DIAGNOSIS — E042 Nontoxic multinodular goiter: Secondary | ICD-10-CM | POA: Insufficient documentation

## 2023-08-28 DIAGNOSIS — Z7982 Long term (current) use of aspirin: Secondary | ICD-10-CM | POA: Insufficient documentation

## 2023-08-28 DIAGNOSIS — Z8673 Personal history of transient ischemic attack (TIA), and cerebral infarction without residual deficits: Secondary | ICD-10-CM | POA: Diagnosis not present

## 2023-08-28 DIAGNOSIS — Z7984 Long term (current) use of oral hypoglycemic drugs: Secondary | ICD-10-CM | POA: Diagnosis not present

## 2023-08-28 LAB — COMPREHENSIVE METABOLIC PANEL
ALT: 36 U/L (ref 0–44)
AST: 30 U/L (ref 15–41)
Albumin: 3.8 g/dL (ref 3.5–5.0)
Alkaline Phosphatase: 45 U/L (ref 38–126)
Anion gap: 9 (ref 5–15)
BUN: 16 mg/dL (ref 8–23)
CO2: 24 mmol/L (ref 22–32)
Calcium: 9.1 mg/dL (ref 8.9–10.3)
Chloride: 101 mmol/L (ref 98–111)
Creatinine, Ser: 1.94 mg/dL — ABNORMAL HIGH (ref 0.61–1.24)
GFR, Estimated: 36 mL/min — ABNORMAL LOW (ref >60)
Glucose, Bld: 73 mg/dL (ref 70–99)
Potassium: 4.6 mmol/L (ref 3.5–5.1)
Sodium: 134 mmol/L — ABNORMAL LOW (ref 135–145)
Total Bilirubin: 0.5 mg/dL (ref <1.2)
Total Protein: 6.6 g/dL (ref 6.5–8.1)

## 2023-08-28 LAB — ETHANOL: Alcohol, Ethyl (B): <10 mg/dL (ref <10)

## 2023-08-28 LAB — DIFFERENTIAL
Abs Immature Granulocytes: 0.02 10*3/uL (ref 0.00–0.07)
Basophils Absolute: 0 10*3/uL (ref 0.0–0.1)
Basophils Relative: 1 %
Eosinophils Absolute: 0.1 10*3/uL (ref 0.0–0.5)
Eosinophils Relative: 2 %
Immature Granulocytes: 0 %
Lymphocytes Relative: 29 %
Lymphs Abs: 1.9 10*3/uL (ref 0.7–4.0)
Monocytes Absolute: 0.3 10*3/uL (ref 0.1–1.0)
Monocytes Relative: 5 %
Neutro Abs: 4 10*3/uL (ref 1.7–7.7)
Neutrophils Relative %: 63 %

## 2023-08-28 LAB — RAPID URINE DRUG SCREEN, HOSP PERFORMED
Amphetamines: NOT DETECTED
Barbiturates: NOT DETECTED
Benzodiazepines: NOT DETECTED
Cocaine: NOT DETECTED
Opiates: NOT DETECTED
Tetrahydrocannabinol: NOT DETECTED

## 2023-08-28 LAB — URINALYSIS, ROUTINE W REFLEX MICROSCOPIC
Bilirubin Urine: NEGATIVE
Glucose, UA: NEGATIVE mg/dL
Hgb urine dipstick: NEGATIVE
Ketones, ur: NEGATIVE mg/dL
Leukocytes,Ua: NEGATIVE
Nitrite: NEGATIVE
Protein, ur: NEGATIVE mg/dL
Specific Gravity, Urine: 1.008 (ref 1.005–1.030)
pH: 7 (ref 5.0–8.0)

## 2023-08-28 LAB — CBC
HCT: 35.4 % — ABNORMAL LOW (ref 39.0–52.0)
Hemoglobin: 11.9 g/dL — ABNORMAL LOW (ref 13.0–17.0)
MCH: 30.4 pg (ref 26.0–34.0)
MCHC: 33.6 g/dL (ref 30.0–36.0)
MCV: 90.5 fL (ref 80.0–100.0)
Platelets: 216 10*3/uL (ref 150–400)
RBC: 3.91 MIL/uL — ABNORMAL LOW (ref 4.22–5.81)
RDW: 13.8 % (ref 11.5–15.5)
WBC: 6.4 10*3/uL (ref 4.0–10.5)
nRBC: 0 % (ref 0.0–0.2)

## 2023-08-28 LAB — APTT: aPTT: 32 s (ref 24–36)

## 2023-08-28 LAB — PROTIME-INR
INR: 1 (ref 0.8–1.2)
Prothrombin Time: 13.5 s (ref 11.4–15.2)

## 2023-08-28 MED ORDER — IOHEXOL 350 MG/ML SOLN
60.0000 mL | Freq: Once | INTRAVENOUS | Status: AC | PRN
  Administered 2023-08-28: 60 mL via INTRAVENOUS

## 2023-08-28 NOTE — ED Provider Notes (Signed)
  Physical Exam  BP 136/81 (BP Location: Left Arm)   Pulse 71   Temp 98.1 F (36.7 C) (Oral)   Resp 13   Wt 65.8 kg   SpO2 95%   BMI 23.05 kg/m   Physical Exam  Procedures  Procedures  ED Course / MDM   Clinical Course as of 08/28/23 1659  Sun Aug 28, 2023  1626 Patient has arrived to the [facility] emergency department.  72 year old male with history of stroke without residual deficits who presented with right hemibody numbness and weakness.  Symptoms started 8 AM this morning after waking up.  Went to bed at 11 PM which is his last known well. [RP]    Clinical Course User Index [RP] Rondel Baton, MD   Medical Decision Making Amount and/or Complexity of Data Reviewed Labs: ordered. Radiology: ordered.   ***

## 2023-08-28 NOTE — ED Triage Notes (Signed)
Lkw 11pm. Went to bed with wife, this am around 0800 he woke herup saying he had right facial pain and right hand numbness. Wife states yesterday he seemed to be staggering some. Had stroke this year, no anticoagulant.

## 2023-08-28 NOTE — ED Notes (Signed)
Pt being transferred to [facility] ED via pov. IV in placed and secured for transfer. Asher Muir, (Consulting civil engineer) at [facility] ED given report. Pt alert and oriented X 4 at the time of discharge. RR even and unlabored. No acute distress noted. Pt and family members verbalized understanding of transfer instructions as discussed. Pt in wheelchair to lobby at time of transfer.

## 2023-08-28 NOTE — ED Provider Notes (Signed)
[place] EMERGENCY DEPARTMENT AT [facility] Provider Note   CSN: [id number] Arrival date & time: 08/28/23  1314     History  Chief Complaint  Patient presents with   Neurologic Problem    [name] is a 72 y.o. male.  Patient here with right-sided weakness and numbness that started around 6:30 AM.  Last known normal was 11 PM last night.  He states he woke up at 6:30 in the morning feeling weak and to the right side of his body.  Family noticed this around 8:00.  Has not gotten better.  He has had some imbalance.  He had a stroke this year.  He is on aspirin but used to be on Plavix.  No visual issues, no speech issues, no chest pain shortness of breath.  The history is provided by the patient and a caregiver.       Home Medications Prior to Admission medications   Medication Sig Start Date End Date Taking? Authorizing Provider  acetaminophen (TYLENOL) 325 MG tablet Take 650 mg by mouth every 6 (six) hours as needed for moderate pain or mild pain.    [provider]  amitriptyline (ELAVIL) 25 MG tablet Take 1 tablet (25 mg total) by mouth at bedtime for 270 doses. 06/15/23 03/11/24  Windell Norfolk, MD  aspirin EC 81 MG tablet Take 1 tablet (81 mg total) by mouth daily. Swallow whole. 11/30/22   [name], MD  citalopram (CELEXA) 20 MG tablet Take 1 tablet (20 mg total) by mouth daily. 06/14/23 09/12/23  Windell Norfolk, MD  dicyclomine (BENTYL) 20 MG tablet Take 20 mg by mouth 3 (three) times daily before meals. 03/12/22   [provider]  docusate sodium (COLACE) 100 MG capsule Take 1 capsule (100 mg total) by mouth 2 (two) times daily. 11/29/22   [name], MD  fluticasone (FLONASE) 50 MCG/ACT nasal spray Place 2 sprays into both nostrils 2 (two) times daily.    [provider]  gabapentin (NEURONTIN) 300 MG capsule Take 300 mg by mouth 3 (three) times daily. 04/02/21   [provider]  metFORMIN (GLUCOPHAGE) 500 MG tablet  Take 1,000 mg by mouth 2 (two) times daily with a meal.    [provider]  ondansetron (ZOFRAN ODT) 4 MG disintegrating tablet Take 1 tablet (4 mg total) by mouth every 8 (eight) hours as needed for nausea or vomiting. 11/21/16   Bethel Born, PA-C  pantoprazole (PROTONIX) 40 MG tablet Take 1 tablet (40 mg total) by mouth daily. 11/29/22 12/30/22  [name], MD  rosuvastatin (CRESTOR) 20 MG tablet Take 1 tablet (20 mg total) by mouth daily. 11/30/22   [name], MD  SUMAtriptan (IMITREX) 100 MG tablet Take 1 tablet by mouth daily as needed for headache or migraine. 11/02/22   [provider]  tamsulosin (FLOMAX) 0.4 MG CAPS capsule Take by mouth daily. 05/21/21   [provider]      Allergies    Atorvastatin calcium, Lisinopril, Bee venom, and Codeine    Review of Systems   Review of Systems  Physical Exam Updated Vital Signs BP 134/84 (BP Location: Left Arm)   Pulse 80   Temp 98 F (36.7 C)   Resp 16   Wt 65.8 kg   SpO2 99%   BMI 23.05 kg/m  Physical Exam Vitals and nursing note reviewed.  Constitutional:      General: He is not in acute distress.  Appearance: He is well-developed.  HENT:     Head: Normocephalic and atraumatic.     Nose: Nose normal.     Mouth/Throat:     Mouth: Mucous membranes are moist.  Eyes:     Extraocular Movements: Extraocular movements intact.     Conjunctiva/sclera: Conjunctivae normal.     Pupils: Pupils are equal, round, and reactive to light.  Cardiovascular:     Rate and Rhythm: Normal rate and regular rhythm.     Pulses: Normal pulses.     Heart sounds: Normal heart sounds. No murmur heard. Pulmonary:     Effort: Pulmonary effort is normal. No respiratory distress.     Breath sounds: Normal breath sounds.  Abdominal:     Palpations: Abdomen is soft.     Tenderness: There is no abdominal tenderness.  Musculoskeletal:        General: No swelling.     Cervical back: Normal range of motion and  neck supple.  Skin:    General: Skin is warm and dry.     Capillary Refill: Capillary refill takes less than 2 seconds.  Neurological:     Mental Status: He is alert.     Comments: Trace weakness in the right upper extremity right lower extremity, numbness to the right side of the face right arm and right leg, no neglect, no aphasia or dysarthria, no neglect, cranial nerves appear to be intact, no obvious facial droop, normal coordination  Psychiatric:        Mood and Affect: Mood normal.     ED Results / Procedures / Treatments   Labs (all labs ordered are listed, but only abnormal results are displayed) Labs Reviewed  CBC - Abnormal; Notable for the following components:      Result Value   RBC 3.91 (*)    Hemoglobin 11.9 (*)    HCT 35.4 (*)    All other components within normal limits  COMPREHENSIVE METABOLIC PANEL - Abnormal; Notable for the following components:   Sodium 134 (*)    Creatinine, Ser 1.94 (*)    GFR, Estimated 36 (*)    All other components within normal limits  ETHANOL  PROTIME-INR  APTT  DIFFERENTIAL  RAPID URINE DRUG SCREEN, HOSP PERFORMED  URINALYSIS, ROUTINE W REFLEX MICROSCOPIC    EKG EKG Interpretation Date/Time:  Sunday August 28 2023 13:24:35 EST Ventricular Rate:  79 PR Interval:  148 QRS Duration:  94 QT Interval:  398 QTC Calculation: 456 R Axis:   78  Text Interpretation: Normal sinus rhythm Normal ECG When compared with ECG of 25-Nov-2022 12:59, PREVIOUS ECG IS PRESENT Confirmed by Virgina Norfolk [id number]) on 08/28/2023 1:36:26 PM  Radiology CT HEAD WO CONTRAST ( ) Result Date: 08/28/2023 CLINICAL DATA:  Provided history: Neuro deficit, acute, stroke suspected. Additional history provided: Right facial pain, right hand numbness, history of stroke. EXAM: CT HEAD WITHOUT CONTRAST TECHNIQUE: Contiguous axial images were obtained from the base of the skull through the vertex without intravenous contrast. RADIATION DOSE REDUCTION: This  exam was performed according to the departmental dose-optimization program which includes automated exposure control, adjustment of the mA and/or kV according to patient size and/or use of iterative reconstruction technique. COMPARISON:  Brain MRI 11/25/2022 FINDINGS: Brain: Generalized cerebral atrophy. Known small chronic cortically-based MCA territory infarcts within the right parietal lobe, better appreciated on the prior brain MRI of 11/25/2022 (acute at that time). Advanced patchy and ill-defined hypoattenuation within the cerebral white matter, nonspecific but compatible with chronic small vessel  ischemic disease. Small chronic infarct again noted within the left cerebellar hemisphere. There is no acute intracranial hemorrhage. No acute demarcated cortical infarct. No extra-axial fluid collection. No evidence of an intracranial mass. No midline shift. Vascular: No hyperdense vessel.  Atherosclerotic calcifications. Skull: No calvarial fracture or aggressive osseous lesion. Sinuses/Orbits: No mass or acute finding within the imaged orbits. Mild mucosal thickening within the inferior right frontal sinus. IMPRESSION: 1.  No evidence of an acute intracranial abnormality. 2. Parenchymal atrophy, chronic small vessel ischemic disease and chronic infarcts as described. 3. Mild right frontal sinus mucosal thickening. Electronically Signed   By: Jackey Loge D.O.   On: 08/28/2023 14:11    Procedures Procedures    Medications Ordered in ED Medications - No data to display  ED Course/ Medical Decision Making/ A&P                                 Medical Decision Making Amount and/or Complexity of Data Reviewed Labs: ordered. Radiology: ordered.   Hart IVAR PERSKY is here with stroke symptoms.  Normal vitals.  No fever.  Went to bed normal last night.  Woke up at 6:30 AM with right-sided weakness.  Had a mild headache but noticed weakness and numbness to the right side of his face arm and leg.  Has had  a prior stroke.  He is on aspirin.  Has not noticed any speech issues but struggled with speech with his last stroke.  He is had balance problems today.  He denies any visual field issues.  He has no neglect or aphasia or visual field deficit on exam.  He had trace weakness in the right arm and leg.  No obvious facial droop.  Speech seems fluent.  He is outside the window for TNK.  Does not meet LVO criteria.  I talked with Dr. Iver Nestle with neurology.  Overall we will get an MRI MRA but will get a head CT here and transferring [facility].  He has a history of CKD.  Per my review and interpretation of labs no significant anemia or electrolyte abnormality.  Creatinine is at baseline.  Head CT is unremarkable per radiology report.  Patient to go POV to [facility] for MRI/MRA.  These have been ordered.  They are aware that they may have to wait in the waiting room for these images but believe this will be the fastest way to get images.  He is hemodynamically stable.  Family feels comfortable with driving him and I think he is safe for transport with them.  Disposition per MRIs.  This chart was dictated using voice recognition software.  Despite best efforts to proofread,  errors can occur which can change the documentation meaning.        Final Clinical Impression(s) / ED Diagnoses Final diagnoses:  Stroke-like symptoms    Rx / DC Orders ED Discharge Orders     None         Virgina Norfolk, DO 08/28/23 1436

## 2023-08-28 NOTE — Discharge Instructions (Signed)
You were seen for your numbness on the right side in the emergency department.   At home, please continue to take the medications you have been prescribed.    Check your MyChart online for the results of any tests that had not resulted by the time you left the emergency department.   Follow-up with your primary doctor in 2-3 days regarding your visit.  Follow-up with your neurologist in 1 week.  Return immediately to the emergency department if you experience any of the following: Vision changes, difficulty speaking, new weakness or numbness, or any other concerning symptoms.    Thank you for visiting our Emergency Department. It was a pleasure taking care of you today.

## 2023-09-01 NOTE — Progress Notes (Signed)
[facility] Summary Report / Loop Recorder 

## 2023-09-05 ENCOUNTER — Ambulatory Visit (INDEPENDENT_AMBULATORY_CARE_PROVIDER_SITE_OTHER): Payer: Medicare Other

## 2023-09-05 DIAGNOSIS — I639 Cerebral infarction, unspecified: Secondary | ICD-10-CM | POA: Diagnosis not present

## 2023-09-05 LAB — CUP PACEART REMOTE DEVICE CHECK
Date Time Interrogation Session: 20241227231111
Implantable Pulse Generator Implant Date: 20240325

## 2023-09-09 ENCOUNTER — Other Ambulatory Visit: Payer: Self-pay | Admitting: Neurology

## 2023-10-10 ENCOUNTER — Ambulatory Visit (INDEPENDENT_AMBULATORY_CARE_PROVIDER_SITE_OTHER): Payer: Medicare Other

## 2023-10-10 DIAGNOSIS — I639 Cerebral infarction, unspecified: Secondary | ICD-10-CM | POA: Diagnosis not present

## 2023-10-10 LAB — CUP PACEART REMOTE DEVICE CHECK
Date Time Interrogation Session: 20250131230909
Implantable Pulse Generator Implant Date: 20240325

## 2023-11-14 ENCOUNTER — Ambulatory Visit: Payer: Medicare Other

## 2023-11-14 DIAGNOSIS — I639 Cerebral infarction, unspecified: Secondary | ICD-10-CM | POA: Diagnosis not present

## 2023-11-14 LAB — CUP PACEART REMOTE DEVICE CHECK
Date Time Interrogation Session: 20250309231819
Implantable Pulse Generator Implant Date: 20240325

## 2023-11-17 NOTE — Progress Notes (Signed)
 Carelink Summary Report / Loop Recorder

## 2023-12-15 ENCOUNTER — Ambulatory Visit: Payer: Medicare Other | Admitting: Physical Therapy

## 2023-12-15 ENCOUNTER — Ambulatory Visit: Payer: Medicare Other

## 2023-12-15 ENCOUNTER — Ambulatory Visit: Payer: Medicare Other | Admitting: Occupational Therapy

## 2023-12-19 ENCOUNTER — Ambulatory Visit: Payer: Medicare Other

## 2023-12-19 DIAGNOSIS — I639 Cerebral infarction, unspecified: Secondary | ICD-10-CM

## 2023-12-19 LAB — CUP PACEART REMOTE DEVICE CHECK
Date Time Interrogation Session: 20250413231832
Implantable Pulse Generator Implant Date: 20240325

## 2023-12-27 ENCOUNTER — Ambulatory Visit: Payer: Medicare Other | Admitting: Neurology

## 2024-01-02 NOTE — Progress Notes (Signed)
 Carelink Summary Report / Loop Recorder

## 2024-01-23 ENCOUNTER — Ambulatory Visit: Payer: Medicare Other

## 2024-01-23 DIAGNOSIS — I639 Cerebral infarction, unspecified: Secondary | ICD-10-CM

## 2024-01-23 LAB — CUP PACEART REMOTE DEVICE CHECK
Date Time Interrogation Session: 20250518232802
Implantable Pulse Generator Implant Date: 20240325

## 2024-01-30 ENCOUNTER — Ambulatory Visit: Payer: Self-pay | Admitting: Cardiovascular Disease

## 2024-02-09 NOTE — Progress Notes (Signed)
 Carelink Summary Report / Loop Recorder

## 2024-02-23 ENCOUNTER — Ambulatory Visit (INDEPENDENT_AMBULATORY_CARE_PROVIDER_SITE_OTHER)

## 2024-02-23 ENCOUNTER — Ambulatory Visit: Payer: Self-pay | Admitting: Cardiovascular Disease

## 2024-02-23 DIAGNOSIS — I639 Cerebral infarction, unspecified: Secondary | ICD-10-CM | POA: Diagnosis not present

## 2024-02-23 LAB — CUP PACEART REMOTE DEVICE CHECK
Date Time Interrogation Session: 20250618232808
Implantable Pulse Generator Implant Date: 20240325

## 2024-02-28 ENCOUNTER — Telehealth: Payer: Self-pay | Admitting: Neurology

## 2024-02-28 NOTE — Telephone Encounter (Signed)
 Call to wife, she reports that patient is having daily headaches. She is not for sure ow accurate because patient gets fixated and something and then it will change.  She reports the amitriptyline  did not work and that she gave him a sumatriptan Monday night. I advised to longer use the sumatriptan due to stroke history. Medication list updated. She also reports patent taking up to 4500 mg tylenol  daily. I advised on maximum daily dose and to not exceed. She states patient has also had increased dizziness when changing positions. Advised I would send to Dr. Gregg for review. Wife appreciative

## 2024-02-28 NOTE — Telephone Encounter (Signed)
 Pt's wife called for a earlier appointment due to patient having migraines(taking medication as prescribed) but still have migraines. Patient also has balance issues; when standing patient starts to fall backwards, when bending over starts to fall forward. Have been going on for a couple of weeks. Would like a call back to discuss if have a earlier appointment.

## 2024-03-15 NOTE — Progress Notes (Signed)
 Carelink Summary Report / Loop Recorder

## 2024-03-23 NOTE — Telephone Encounter (Signed)
 Can you add him to the cancellation list. Thanks

## 2024-03-26 ENCOUNTER — Ambulatory Visit

## 2024-03-26 DIAGNOSIS — I639 Cerebral infarction, unspecified: Secondary | ICD-10-CM

## 2024-03-26 NOTE — Telephone Encounter (Signed)
Wait listed

## 2024-03-27 LAB — CUP PACEART REMOTE DEVICE CHECK
Date Time Interrogation Session: 20250720233345
Implantable Pulse Generator Implant Date: 20240325

## 2024-03-31 ENCOUNTER — Ambulatory Visit: Payer: Self-pay | Admitting: Cardiovascular Disease

## 2024-04-24 NOTE — Progress Notes (Signed)
 Carelink Summary Report / Loop Recorder

## 2024-04-26 ENCOUNTER — Ambulatory Visit (INDEPENDENT_AMBULATORY_CARE_PROVIDER_SITE_OTHER)

## 2024-04-26 DIAGNOSIS — I639 Cerebral infarction, unspecified: Secondary | ICD-10-CM | POA: Diagnosis not present

## 2024-04-26 LAB — CUP PACEART REMOTE DEVICE CHECK
Date Time Interrogation Session: 20250820234542
Implantable Pulse Generator Implant Date: 20240325

## 2024-04-30 ENCOUNTER — Ambulatory Visit: Payer: Self-pay | Admitting: Cardiovascular Disease

## 2024-04-30 NOTE — Progress Notes (Signed)
 SUBJECTIVE: 73 y.o. male for follow up of diabetes.  Currently on Jardiance 25 mg daily. Fasting BGL ranges from 130-150. Other symptoms and concerns: peeing a lot since starting Jardiance.   Reviewed and updated this visit by provider: Tobacco  Allergies  Meds  Problems  Med Hx  Surg Hx  Fam Hx       Current Medications[1]  Review of Systems  Constitutional: Negative.   HENT: Negative.    Eyes: Negative.   Respiratory: Negative.    Cardiovascular: Negative.   Gastrointestinal: Negative.   Genitourinary:  Positive for frequency (since starting Jardiance) and urgency.  Musculoskeletal: Negative.   Skin: Negative.   Neurological:  Positive for headaches.    OBJECTIVE: BP 126/70 (BP Location: Left Upper Arm, Patient Position: Sitting)   Pulse 66   Temp 97.3 F (36.3 C) (Skin)   Resp 18   Ht 5' 6 (1.676 m)   Wt 157 lb (71.2 kg)   SpO2 98%   BMI 25.34 kg/m   Physical Exam Vitals and nursing note reviewed.  Constitutional:      General: He is not in acute distress.    Appearance: Normal appearance.  HENT:     Head: Normocephalic and atraumatic.  Cardiovascular:     Rate and Rhythm: Normal rate and regular rhythm.     Pulses: Normal pulses.     Heart sounds: Normal heart sounds. No murmur heard. Pulmonary:     Effort: Pulmonary effort is normal.     Breath sounds: Normal breath sounds. No stridor. No wheezing, rhonchi or rales.  Skin:    General: Skin is warm and dry.  Neurological:     General: No focal deficit present.     Mental Status: He is alert and oriented to person, place, and time.  Psychiatric:        Mood and Affect: Mood normal.        Behavior: Behavior normal.        Thought Content: Thought content normal.        Judgment: Judgment normal.   ASSESSMENT: 1. Type 2 diabetes mellitus with hyperglycemia, without long-term current use of insulin (*) - POCT Hemoglobin A1C - Comprehensive Metabolic Panel; Future - CBC And Differential;  Future - CBC And Differential - Comprehensive Metabolic Panel - tirzepatide (MOUNJARO) 2.5 mg/0.5 mL SOAJ pen injection; Inject 0.5 mLs (2.5 mg dose) into the skin every 7 (seven) days.  Dispense: 2 mL; Refill: 0  2. Stage 3a chronic kidney disease (*)  3. History of migraine  4. Essential hypertension  5. History of CVA with residual deficit   Diabetes Mellitus: poorly controlled and needs improvement, A1C increased to 10.7 from 7.0.  PLAN: 1-2. POCT A1C 10.7, increased from 7.0 in May 2025. Pt made aware. Labs today: see orders.  Continue Jardiance 25 mg po daily. Pt reports been on Ozempic before and did not tolerate well, will try Mounjaro and assess response. Start Mounjaro 2.5 mg weekly for 4 weeks, will assess for any side effects and if none increase to 5 mg weekly. Advised pt to contact nephrology for appointment and call CPP for apt.  UTD on eye exam. Issues reviewed with him: all medications, side effects and compliance discussed carefully.  Diabetes Goals: Sugar between 70-110. A1C less then 7% Eat properly with low carborhydrate diet. Exercise 3-4 times weekly for 45 mins.  Eye exam yearly to check the small vessels for diabetes complications. Check feet every morning and night for wounds.  Blood pressure goal <130/80 LDL (bad cholesterol) goal 70-100 HDL (good cholesterol) goal >45 Triglycerides (fatty cholesterol) < 150 consistently take your medications  3. Patient has hx of migraines and is followed by neurology. He reports previously being on a medication for migraines but stopped taking. Pt will bring this up with neurology at his next appointment.  4. BP a goal. Continue medications as prescribed.  5. Cont follow up with neuro.  Follow up in about 3 months (around 07/31/2024) for Diabetes f/u, med check, labs.        [1] Current Outpatient Medications  Medication Sig Dispense Refill  . ACCU-CHEK FASTCLIX LANCETS MISC TEST BLOOD SUGAR  TWICE DAILY 204 each 2  . acetaminophen  (TYLENOL ) 325 mg tablet Take two tablets (650 mg dose) by mouth every 6 (six) hours as needed for Pain.    . aspirin  (ECOTRIN LOW DOSE) EC tablet Take one tablet (81 mg dose) by mouth daily.    . empagliflozin (JARDIANCE) 25 mg TABS tablet Take one tablet (25 mg dose) by mouth daily. 90 tablet 1  . EPINEPHrine  (EPIPEN  2-PAK) 0.3 mg/0.3 mL injection Inject 0.3 mLs (0.3 mg dose) into the muscle once as needed for Anaphylaxis for up to 1 dose. 2 each 0  . esomeprazole magnesium  (NEXIUM) 40 mg capsule TAKE 1 CAPSULE BY MOUTH 30  MINUTES BEFORE BREAKFAST 100 capsule 2  . fluticasone propionate (FLONASE) 50 mcg/actuation nasal spray USE 2 SPRAYS IN BOTH  NOSTRILS ONCE DAILY 48 g 5  . gabapentin (NEURONTIN) 300 mg capsule TAKE 1 CAPSULE BY MOUTH 3 TIMES  DAILY 300 capsule 2  . glucose blood (ACCU-CHEK GUIDE) test strip Use to monitor blood glucose 2 time(s) daily 100 each 12  . rosuvastatin  calcium  (CRESTOR ) 20 mg tablet Take one tablet (20 mg dose) by mouth at bedtime. 90 tablet 1  . tamsulosin (FLOMAX) 0.4 mg CAPS Take one capsule (0.4 mg dose) by mouth daily. 90 capsule 1  . tirzepatide (MOUNJARO) 2.5 mg/0.5 mL SOAJ pen injection Inject 0.5 mLs (2.5 mg dose) into the skin every 7 (seven) days. 2 mL 0   No current facility-administered medications for this visit.

## 2024-05-23 ENCOUNTER — Other Ambulatory Visit: Payer: Self-pay

## 2024-05-23 DIAGNOSIS — N184 Chronic kidney disease, stage 4 (severe): Secondary | ICD-10-CM

## 2024-05-28 ENCOUNTER — Ambulatory Visit (INDEPENDENT_AMBULATORY_CARE_PROVIDER_SITE_OTHER)

## 2024-05-28 DIAGNOSIS — I639 Cerebral infarction, unspecified: Secondary | ICD-10-CM

## 2024-05-28 LAB — CUP PACEART REMOTE DEVICE CHECK
Date Time Interrogation Session: 20250921233331
Implantable Pulse Generator Implant Date: 20240325

## 2024-05-29 NOTE — Progress Notes (Signed)
 Remote Loop Recorder Transmission

## 2024-06-11 NOTE — Progress Notes (Signed)
 Remote Loop Recorder Transmission

## 2024-06-12 ENCOUNTER — Ambulatory Visit: Payer: Self-pay | Admitting: Cardiovascular Disease

## 2024-06-28 ENCOUNTER — Encounter

## 2024-06-29 ENCOUNTER — Ambulatory Visit: Attending: Cardiovascular Disease

## 2024-06-29 DIAGNOSIS — I639 Cerebral infarction, unspecified: Secondary | ICD-10-CM

## 2024-06-30 LAB — CUP PACEART REMOTE DEVICE CHECK
Date Time Interrogation Session: 20251023233105
Implantable Pulse Generator Implant Date: 20240325

## 2024-07-02 ENCOUNTER — Ambulatory Visit
Admission: RE | Admit: 2024-07-02 | Discharge: 2024-07-02 | Disposition: A | Discharge status: Home / Self Care | Source: Ambulatory Visit

## 2024-07-02 DIAGNOSIS — N184 Chronic kidney disease, stage 4 (severe): Secondary | ICD-10-CM

## 2024-07-03 NOTE — Progress Notes (Signed)
 Remote Loop Recorder Transmission

## 2024-07-11 ENCOUNTER — Ambulatory Visit: Payer: Self-pay | Admitting: Cardiovascular Disease

## 2024-07-17 ENCOUNTER — Encounter: Payer: Self-pay | Admitting: Neurology

## 2024-07-17 ENCOUNTER — Ambulatory Visit: Admitting: Neurology

## 2024-07-17 VITALS — BP 148/86 | HR 64 | Ht 66.0 in | Wt 189.0 lb

## 2024-07-17 DIAGNOSIS — R519 Headache, unspecified: Secondary | ICD-10-CM | POA: Diagnosis not present

## 2024-07-17 DIAGNOSIS — I63411 Cerebral infarction due to embolism of right middle cerebral artery: Secondary | ICD-10-CM | POA: Diagnosis not present

## 2024-07-17 DIAGNOSIS — F32A Depression, unspecified: Secondary | ICD-10-CM | POA: Diagnosis not present

## 2024-07-17 DIAGNOSIS — R2689 Other abnormalities of gait and mobility: Secondary | ICD-10-CM

## 2024-07-17 DIAGNOSIS — R4789 Other speech disturbances: Secondary | ICD-10-CM

## 2024-07-17 DIAGNOSIS — R4189 Other symptoms and signs involving cognitive functions and awareness: Secondary | ICD-10-CM

## 2024-07-17 DIAGNOSIS — R5383 Other fatigue: Secondary | ICD-10-CM

## 2024-07-17 MED ORDER — CITALOPRAM HYDROBROMIDE 20 MG PO TABS: 20.0000 mg | ORAL_TABLET | Freq: Every day | ORAL | 3 refills | Status: AC

## 2024-07-17 MED ORDER — AMITRIPTYLINE HCL 50 MG PO TABS: 50.0000 mg | ORAL_TABLET | Freq: Every day | ORAL | 3 refills | Status: DC

## 2024-07-17 NOTE — Patient Instructions (Signed)
 Restart Elavil  25 mg nightly for 2 weeks then increase to 50 mg nightly Restart Celexa  20 mg daily Consider decreasing gabapentin to 200 mg twice daily Referral to sleep neurology for sleep apnea evaluation Referral to physical and speech therapy Decrease Tylenol  to no more than 3 days a week for the headaches Consider Excedrin Migraine but again no more than 2 days a week Follow-up in 6 to 8 months or sooner if worse

## 2024-07-17 NOTE — Progress Notes (Signed)
 [facility]  PATIENT: [name] DOB: 1951/06/05  REQUESTING CLINICIAN: Sophronia Ozell BROCKS, MD HISTORY FROM: Patient and spouse REASON FOR VISIT: Right MCA strokes/Headaches   HISTORICAL  CHIEF COMPLAINT:  Chief Complaint  Patient presents with   Follow-up    Pt in room 12. Wife [name] in room.Here for migraine follow up.    INTERVAL HISTORY 07/17/2024 Patient presents today for follow-up, he is accompanied by wife.  Last visit was a year ago.  At that time we started him on Amitriptyline  and Celexa .  Patient tells me initially the medication was helpful but when he ran out of refills he stopped using the medication.  He tells me that he continued to have headaches, mainly morning headaches that will ease off by midday.  He is easily irritable, reports dizziness on occasion.  He also has difficulty with balance he is currently using a walker.  He reports a couple falls in the past year.  Has not completed any physical therapy lately Wife is also reporting cognitive decline, word finding difficulty, difficulty expressing himself.   INTERVAL HISTORY 06/14/2023:  Patient presents today for follow-up, he is accompanied by wife.  Last visit was in May, since then he is complaining of new headache with double vision.  Headache is mostly on the left temple, described as sharp pain, sometimes affecting behind the left eye.  He also reports some tenderness around in the left temple. Patient is also reporting dizziness upon standing, that he described as swimmy headed, no room spinning sensation.  He uses a cane with ambulation.  Wife tells me that he sleeps a lot, does not like to do any activities, does not like to walk or exercise.  He reports that his neuropathy is better, currently he is on gabapentin 300 mg 3 times daily, for his headaches, he is also on amitriptyline  and sumatriptan.  States the amitriptyline  does help but the headaches are still present.   HISTORY  OF PRESENT ILLNESS:  This is a 73 year old man with history of migraines headaches, diabetes, and CKDs who is presenting after being admitted for acute right hemispheric stroke. Patient initially presented to the ED with complains of headaches, reports that nothing was right, he was throwing up, confused and also had left side weakness and could not walk.  In the ED, he was found to have a right MCA stroke, cortical strokes. Etiology likely embolic, his MRA did not show any large vessel occlusion and his EF was normal 60 to 65%. He was recommended DAPT for a total of 21 days and then aspirin  thereafter. He was also recommended outpatient rehab. He was also recommended a loop recorder.  Since discharge from hospital he continues to improve physically with the help of rehab   Hospital course and summary  73 year old man with PMH of CKD 3A, type II DM, migraine, present to the hospital with complaints of left-sided weakness and dizziness.  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  Neurology was consulted.  Stroke workup initiated. Assessment and Plan  Acute right MCA stroke. Possible small vessel disease. CT head without any acute abnormality. MRI confirms acute right posterior MCA infarct. MRA shows no large vessel occlusion. Echocardiogram shows preserved EF of 60-65% without any valvular abnormality. Lower extremity Doppler negative for DVT. Carotid Doppler negative for any significant stenosis. PT OT as well as SLP evaluation recommending CIR. on repeat evaluation patient improved significantly and therefore therapy recommended possibility of going home.  Patient will continue with outpatient neurorehab for SLP. Currently on dysphagia 2 diet. Not on any thrombophlebitic medication prior to admission.  Currently on 81 mg aspirin  and 75 mg Plavix  daily.  Continue for 3 weeks and then aspirin  alone. Patient will require loop recorder prior to discharge.   OTHER MEDICAL CONDITIONS: CKD 3, Diabetes,  Hyperlipidemia,    REVIEW OF SYSTEMS: Full 14 system review of systems performed and negative with exception of: As noted in the HPI   ALLERGIES: Allergies  Allergen Reactions   Atorvastatin  Calcium  Other (See Comments)   Lisinopril Swelling   Bee Venom Swelling    Swelling   Swelling   Codeine Itching    HOME MEDICATIONS: Outpatient Medications Prior to Visit  Medication Sig Dispense Refill   acetaminophen  (TYLENOL ) 325 MG tablet Take 650 mg by mouth every 6 (six) hours as needed for moderate pain or mild pain.     aspirin  EC 81 MG tablet Take 1 tablet (81 mg total) by mouth daily. Swallow whole. 30 tablet 12   citalopram  (CELEXA ) 20 MG tablet TAKE 1 TABLET BY MOUTH EVERY DAY 90 tablet 0   dicyclomine (BENTYL) 20 MG tablet Take 20 mg by mouth 3 (three) times daily before meals.     docusate sodium  (COLACE) 100 MG capsule Take 1 capsule (100 mg total) by mouth 2 (two) times daily. 10 capsule 0   fluticasone (FLONASE) 50 MCG/ACT nasal spray Place 2 sprays into both nostrils 2 (two) times daily.     gabapentin (NEURONTIN) 300 MG capsule Take 300 mg by mouth 3 (three) times daily.     MOUNJARO 5 MG/0.5ML Pen Inject 5 mg into the skin once a week.     ondansetron  (ZOFRAN  ODT) 4 MG disintegrating tablet Take 1 tablet (4 mg total) by mouth every 8 (eight) hours as needed for nausea or vomiting. 6 tablet 0   pantoprazole  (PROTONIX ) 40 MG tablet Take 1 tablet (40 mg total) by mouth daily. 30 tablet 0   rosuvastatin  (CRESTOR ) 20 MG tablet Take 1 tablet (20 mg total) by mouth daily. 30 tablet 0   tamsulosin (FLOMAX) 0.4 MG CAPS capsule Take by mouth daily.     metFORMIN (GLUCOPHAGE) 500 MG tablet Take 1,000 mg by mouth 2 (two) times daily with a meal. (Patient not taking: Reported on 07/17/2024)     No facility-administered medications prior to visit.    PAST MEDICAL HISTORY: Past Medical History:  Diagnosis Date   Diabetes mellitus without complication (HCC)    Migraine    Stroke  (HCC)     PAST SURGICAL HISTORY: Past Surgical History:  Procedure Laterality Date   BACK SURGERY     GIVENS CAPSULE STUDY N/A 05/05/2015   Procedure: GIVENS CAPSULE STUDY;  Surgeon: Renaye Sous, MD;  Location: [facility] ENDOSCOPY;  Service: Endoscopy;  Laterality: N/A;   LOOP RECORDER INSERTION N/A 11/29/2022   Procedure: LOOP RECORDER INSERTION;  Surgeon: Nancey Eulas BRAVO, MD;  Location: [facility] INVASIVE CV LAB;  Service: Cardiovascular;  Laterality: N/A;    FAMILY HISTORY: Family History  Problem Relation Age of Onset   Esophageal cancer Mother    Stomach cancer Father    Colon cancer Father    Pancreatic cancer Sister    Arthritis Sister    Heart disease Sister     SOCIAL HISTORY: Social History   Socioeconomic History   Marital status: Married    Spouse name: Not on file   Number of children: Not on file  Years of education: Not on file   Highest education level: Not on file  Occupational History   Not on file  Tobacco Use   Smoking status: Never   Smokeless tobacco: Never  Vaping Use   Vaping status: Never Used  Substance and Sexual Activity   Alcohol use: No   Drug use: No   Sexual activity: Not on file  Other Topics Concern   Not on file  Social History Narrative   Lives at home   Caffeine- 8-16oz   Right handed   Social Drivers of Health   Financial Resource Strain: Low Risk  (01/31/2024)   Received [facility]   Overall Financial Resource Strain (CARDIA)    Difficulty of Paying Living Expenses: Not hard at all  Food Insecurity: No Food Insecurity (01/31/2024)   Received from [facility]   Hunger Vital Sign    Within the past 12 months, you worried that your food would run out before you got the money to buy more.: Never true    Within the past 12 months, the food you bought just didn't last and you didn't have money to get more.: Never true  Transportation Needs: No Transportation Needs (01/31/2024)   Received [facility] -  Transportation    Lack of Transportation (Medical): No    Lack of Transportation (Non-Medical): No  Physical Activity: Unknown (01/31/2024)   Received from [facility]   Exercise Vital Sign    On average, how many days per week do you engage in moderate to strenuous exercise (like a brisk walk)?: 0 days    Minutes of Exercise per Session: Not on file  Stress: Stress Concern Present (01/31/2024)   Received from [facility] - Occupational Stress Questionnaire    Feeling of Stress : To some extent  Social Connections: Moderately Integrated (01/31/2024)   Received from [facility]   Social Network    How would you rate your social network (family, work, friends)?: Adequate participation with social networks  Intimate Partner Violence: Not At Risk (01/31/2024)   Received from [facility]   HITS    Over the last 12 months how often did your partner physically hurt you?: Rarely    Over the last 12 months how often did your partner insult you or talk down to you?: Never    Over the last 12 months how often did your partner threaten you with physical harm?: Never    Over the last 12 months how often did your partner scream or curse at you?: Never    PHYSICAL EXAM  GENERAL EXAM/CONSTITUTIONAL: Vitals:  Vitals:   07/17/24 1056  BP: (!) 148/86  Pulse: 64  Weight: 189 lb (85.7 kg)  Height: 5' 6 (1.676 m)   Body mass index is 30.51 kg/m. Wt Readings from Last 3 Encounters:  07/17/24 189 lb (85.7 kg)  08/28/23 145 lb (65.8 kg)  06/14/23 152 lb (68.9 kg)   Patient is in no distress; well developed, nourished and groomed; neck is supple  MUSCULOSKELETAL: Gait, strength, tone, movements noted in Neurologic exam below  NEUROLOGIC: MENTAL STATUS:      No data to display         awake, alert, oriented to person, place and time recent and remote memory intact normal attention and concentration language fluent, comprehension intact,  naming intact fund of knowledge appropriate  CRANIAL NERVE:  2nd, 3rd, 4th, 6th - Visual fields full  to confrontation, extraocular muscles intact, no nystagmus 5th - facial sensation symmetric 7th - facial strength symmetric 8th - hearing intact 9th - palate elevates symmetrically, uvula midline 11th - shoulder shrug symmetric 12th - tongue protrusion midline Decrease facial expressions  MOTOR:  normal bulk and tone, full strength in the BUE, BLE. He does have bradykinesia but no rigidity and no tremors   SENSORY:  normal and symmetric to light touch  COORDINATION:  finger-nose-finger, fine finger movements normal  GAIT/STATION:  Walks with a cane, positive Romberg and positive pull test    DIAGNOSTIC DATA (LABS, IMAGING, TESTING) - I reviewed patient records, labs, notes, testing and imaging myself where available.  Lab Results  Component Value Date   WBC 6.4 08/28/2023   HGB 11.9 (L) 08/28/2023   HCT 35.4 (L) 08/28/2023   MCV 90.5 08/28/2023   PLT 216 08/28/2023      Component Value Date/Time   NA 134 (L) 08/28/2023 1335   K 4.6 08/28/2023 1335   CL 101 08/28/2023 1335   CO2 24 08/28/2023 1335   GLUCOSE 73 08/28/2023 1335   BUN 16 08/28/2023 1335   CREATININE 1.94 (H) 08/28/2023 1335   CALCIUM  9.1 08/28/2023 1335   PROT 6.6 08/28/2023 1335   ALBUMIN 3.8 08/28/2023 1335   AST 30 08/28/2023 1335   ALT 36 08/28/2023 1335   ALKPHOS 45 08/28/2023 1335   BILITOT 0.5 08/28/2023 1335   GFRNONAA 36 (L) 08/28/2023 1335   GFRAA >60 11/20/2016 2235   Lab Results  Component Value Date   CHOL 95 11/26/2022   HDL 34 (L) 11/26/2022   LDLCALC 35 11/26/2022   TRIG 132 11/26/2022   CHOLHDL 2.8 11/26/2022   Lab Results  Component Value Date   HGBA1C 6.7 (H) 11/26/2022   Lab Results  Component Value Date   VITAMINB12 251 11/12/2008   Lab Results  Component Value Date   TSH 0.628 Test methodology is 3rd generation TSH 11/12/2008    MRI Brain 01/06/2023 1.  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 2. Underlying mild age-related cerebral atrophy with moderate chronic microvascular ischemic disease  MRA Head 11/25/2022 1. Negative intracranial MRA for large vessel occlusion or other emergent finding. No hemodynamically significant or correctable stenosis. 2. Dolichoectatic appearance of the intracranial arterial circulation, suggesting chronic underlying hypertension.   Echocardiogram 11/26/2022 1. Limited study to 35 images - exam stopped due to patient non-compliance  2. Left ventricular ejection fraction, by estimation, is 60 to 65%. The left ventricle has normal function. The left ventricle has no regional wall motion abnormalities. Left ventricular diastolic parameters are consistent with Grade I diastolic dysfunction (impaired relaxation).  3. The aortic valve is tricuspid. Aortic valve regurgitation is not visualized.  4. The mitral valve is grossly normal. No evidence of mitral valve regurgitation.  5. Right ventricular systolic function was not well visualized. The right ventricular size is not well visualized.     ASSESSMENT AND PLAN  73 y.o. year old male with vascular risk factors including diabetes, hyperlipidemia, who is presenting for follow-up for his right MCA stroke, headaches and gait abnormality.  He is currently on aspirin  and statin, compliant with the medications.  Regarding his headaches, he reports a worsening of the headaches since stopping the amitriptyline , he does also report morning headaches, daytime fatigue, falling asleep easily.  I wonder if he does have untreated sleep apnea.  Plan would be to restart amitriptyline , but we will refer him for a sleep  study. When it comes to his abnormal gait, dizziness, I will refer him to physical therapy and will also get him to speech therapy for his cognitive deficit and word finding difficulties.  Wife also tells me that Celexa  was helpful, will  restart it. I will see him in 6 months for follow-up but he understand to contact me with any concerns.   1. Cerebrovascular accident (CVA) due to embolism of right middle cerebral artery (HCC)   2. Morning headache   3. Fatigue, unspecified type   4. Nonintractable headache, unspecified chronicity pattern, unspecified headache type   5. Depression, unspecified depression type   6. Word finding difficulty   7. Brain fog   8. Balance problem      Patient Instructions  Restart Elavil  25 mg nightly for 2 weeks then increase to 50 mg nightly Restart Celexa  20 mg daily Consider decreasing gabapentin to 200 mg twice daily Referral to sleep neurology for sleep apnea evaluation Referral to physical and speech therapy Decrease Tylenol  to no more than 3 days a week for the headaches Consider Excedrin Migraine but again no more than 2 days a week Follow-up in 6 to 8 months or sooner if worse  Orders Placed This Encounter  Procedures   Ambulatory referral to Neurology   Ambulatory referral to Speech Therapy   Ambulatory referral to Physical Therapy    Meds ordered this encounter  Medications   amitriptyline  (ELAVIL ) 50 MG tablet    Sig: Take 1 tablet (50 mg total) by mouth at bedtime.    Dispense:  90 tablet    Refill:  3   citalopram  (CELEXA ) 20 MG tablet    Sig: Take 1 tablet (20 mg total) by mouth daily.    Dispense:  90 tablet    Refill:  3    Return in about 6 months (around 01/14/2025).  I personally spent a total of 50 minutes in the care of the patient today including preparing to see the patient, getting/reviewing separately obtained history, performing a medically appropriate exam/evaluation, counseling and educating, placing orders, referring and communicating with other health care professionals, and documenting clinical information in the EHR.   Pastor Falling, MD 07/17/2024, 4:49 PM  [facility] [address], [address] [place], [place]  [address] [phone]

## 2024-07-18 ENCOUNTER — Telehealth: Payer: Self-pay | Admitting: Neurology

## 2024-07-18 NOTE — Telephone Encounter (Signed)
 Referral for speech therapy sent through EPIC to [facility]. [phone], Fax: [phone]

## 2024-07-18 NOTE — Telephone Encounter (Signed)
 Referral for physical therapy sent through EPIC to [facility]. [phone], Fax: [phone]

## 2024-07-30 ENCOUNTER — Ambulatory Visit

## 2024-07-30 ENCOUNTER — Encounter

## 2024-07-30 DIAGNOSIS — I639 Cerebral infarction, unspecified: Secondary | ICD-10-CM

## 2024-07-31 LAB — CUP PACEART REMOTE DEVICE CHECK
Date Time Interrogation Session: 20251123234821
Implantable Pulse Generator Implant Date: 20240325

## 2024-07-31 NOTE — Progress Notes (Signed)
 Remote Loop Recorder Transmission

## 2024-08-07 ENCOUNTER — Ambulatory Visit: Payer: Self-pay | Admitting: Cardiovascular Disease

## 2024-08-30 ENCOUNTER — Ambulatory Visit

## 2024-08-30 ENCOUNTER — Encounter

## 2024-08-30 DIAGNOSIS — I639 Cerebral infarction, unspecified: Secondary | ICD-10-CM

## 2024-08-31 LAB — CUP PACEART REMOTE DEVICE CHECK
Date Time Interrogation Session: 20251224233202
Implantable Pulse Generator Implant Date: 20240325

## 2024-08-31 NOTE — Progress Notes (Signed)
 Remote Loop Recorder Transmission

## 2024-09-05 ENCOUNTER — Ambulatory Visit: Payer: Self-pay | Admitting: Cardiovascular Disease

## 2024-09-09 ENCOUNTER — Other Ambulatory Visit: Payer: Self-pay | Admitting: Neurology

## 2024-09-30 ENCOUNTER — Ambulatory Visit: Attending: Cardiovascular Disease

## 2024-09-30 DIAGNOSIS — I639 Cerebral infarction, unspecified: Secondary | ICD-10-CM | POA: Diagnosis not present

## 2024-10-01 ENCOUNTER — Encounter

## 2024-10-01 LAB — CUP PACEART REMOTE DEVICE CHECK
Date Time Interrogation Session: 20260124233932
Implantable Pulse Generator Implant Date: 20240325

## 2024-10-02 ENCOUNTER — Ambulatory Visit: Payer: Self-pay | Admitting: Cardiovascular Disease

## 2024-10-04 NOTE — Progress Notes (Signed)
 Remote Loop Recorder Transmission

## 2024-10-31 ENCOUNTER — Ambulatory Visit

## 2024-11-01 ENCOUNTER — Encounter

## 2024-12-01 ENCOUNTER — Ambulatory Visit

## 2025-01-01 ENCOUNTER — Ambulatory Visit

## 2025-02-01 ENCOUNTER — Ambulatory Visit

## 2025-02-06 ENCOUNTER — Ambulatory Visit: Admitting: Neurology

## 2025-02-18 ENCOUNTER — Ambulatory Visit: Admitting: Neurology

## 2025-03-04 ENCOUNTER — Ambulatory Visit

## 2025-04-04 ENCOUNTER — Ambulatory Visit

## 2025-05-05 ENCOUNTER — Ambulatory Visit

## 2025-06-05 ENCOUNTER — Ambulatory Visit

## 2025-07-06 ENCOUNTER — Ambulatory Visit

## 2025-08-06 ENCOUNTER — Ambulatory Visit

## 2025-10-07 ENCOUNTER — Ambulatory Visit
